# Patient Record
Sex: Female | Born: 1940
Health system: Southern US, Community
[De-identification: ages and names within clinical notes are randomized; demographics above are authoritative.]

## PROBLEM LIST (undated history)

## (undated) DIAGNOSIS — M8580 Other specified disorders of bone density and structure, unspecified site: Secondary | ICD-10-CM

## (undated) DIAGNOSIS — D569 Thalassemia, unspecified: Secondary | ICD-10-CM

## (undated) DIAGNOSIS — C4490 Unspecified malignant neoplasm of skin, unspecified: Secondary | ICD-10-CM

## (undated) DIAGNOSIS — D649 Anemia, unspecified: Secondary | ICD-10-CM

## (undated) DIAGNOSIS — E785 Hyperlipidemia, unspecified: Secondary | ICD-10-CM

## (undated) HISTORY — DX: Hyperlipidemia, unspecified: E78.5

## (undated) HISTORY — PX: BUNIONECTOMY: SHX129

## (undated) HISTORY — PX: TONSILLECTOMY AND ADENOIDECTOMY: SUR1326

## (undated) HISTORY — DX: Other specified disorders of bone density and structure, unspecified site: M85.80

## (undated) HISTORY — PX: DILATION AND CURETTAGE OF UTERUS: SHX78

## (undated) HISTORY — DX: Unspecified malignant neoplasm of skin, unspecified: C44.90

## (undated) HISTORY — DX: Thalassemia, unspecified: D56.9

## (undated) HISTORY — PX: CATARACT EXTRACTION: SUR2

## (undated) HISTORY — DX: Anemia, unspecified: D64.9

## (undated) HISTORY — PX: TUBAL LIGATION: SHX77

## (undated) HISTORY — PX: OTHER SURGICAL HISTORY: SHX169

## (undated) HISTORY — PX: APPENDECTOMY: SHX54

---

## 2001-06-19 HISTORY — PX: COLONOSCOPY: SHX174

## 2001-06-19 LAB — HM COLONOSCOPY

## 2001-10-21 DIAGNOSIS — C4491 Basal cell carcinoma of skin, unspecified: Secondary | ICD-10-CM

## 2001-10-21 DIAGNOSIS — D229 Melanocytic nevi, unspecified: Secondary | ICD-10-CM

## 2001-10-21 HISTORY — DX: Basal cell carcinoma of skin, unspecified: C44.91

## 2001-10-21 HISTORY — DX: Melanocytic nevi, unspecified: D22.9

## 2003-03-13 ENCOUNTER — Encounter: Payer: Self-pay | Admitting: Internal Medicine

## 2003-03-13 ENCOUNTER — Other Ambulatory Visit: Admission: RE | Admit: 2003-03-13 | Discharge: 2003-03-13 | Payer: Self-pay | Admitting: Internal Medicine

## 2003-04-22 ENCOUNTER — Encounter: Admission: RE | Admit: 2003-04-22 | Discharge: 2003-04-22 | Payer: Self-pay | Admitting: Internal Medicine

## 2005-04-10 ENCOUNTER — Ambulatory Visit: Payer: Self-pay | Admitting: Internal Medicine

## 2005-08-31 ENCOUNTER — Encounter: Admission: RE | Admit: 2005-08-31 | Discharge: 2005-08-31 | Payer: Self-pay | Admitting: Internal Medicine

## 2005-09-20 ENCOUNTER — Encounter: Admission: RE | Admit: 2005-09-20 | Discharge: 2005-09-20 | Payer: Self-pay | Admitting: Internal Medicine

## 2006-04-12 ENCOUNTER — Ambulatory Visit: Payer: Self-pay | Admitting: Internal Medicine

## 2006-04-12 LAB — CONVERTED CEMR LAB
ALT: 26 units/L (ref 0–40)
AST: 28 units/L (ref 0–37)
Albumin: 4.2 g/dL (ref 3.5–5.2)
Alkaline Phosphatase: 49 units/L (ref 39–117)
BUN: 17 mg/dL (ref 6–23)
Bilirubin, Direct: 0.1 mg/dL (ref 0.0–0.3)
CO2: 28 meq/L (ref 19–32)
Calcium: 9.2 mg/dL (ref 8.4–10.5)
Chloride: 108 meq/L (ref 96–112)
Chol/HDL Ratio, serum: 3.6
Cholesterol: 209 mg/dL (ref 0–200)
Creatinine, Ser: 0.9 mg/dL (ref 0.4–1.2)
GFR calc non Af Amer: 66 mL/min
Glomerular Filtration Rate, Af Am: 80 mL/min/{1.73_m2}
Glucose, Bld: 92 mg/dL (ref 70–99)
HDL: 57.6 mg/dL (ref >39.0)
LDL DIRECT: 125.8 mg/dL
Potassium: 4.5 meq/L (ref 3.5–5.1)
Sodium: 142 meq/L (ref 135–145)
TSH: 0.97 microintl units/mL (ref 0.35–5.50)
Total Bilirubin: 0.8 mg/dL (ref 0.3–1.2)
Total Protein: 6.6 g/dL (ref 6.0–8.3)
Triglyceride fasting, serum: 66 mg/dL (ref 0–149)
VLDL: 13 mg/dL (ref 0–40)

## 2006-04-27 ENCOUNTER — Ambulatory Visit: Payer: Self-pay | Admitting: Internal Medicine

## 2006-10-02 ENCOUNTER — Encounter: Admission: RE | Admit: 2006-10-02 | Discharge: 2006-10-02 | Payer: Self-pay | Admitting: Internal Medicine

## 2006-11-01 ENCOUNTER — Ambulatory Visit: Payer: Self-pay | Admitting: Internal Medicine

## 2007-02-15 DIAGNOSIS — D568 Other thalassemias: Secondary | ICD-10-CM | POA: Insufficient documentation

## 2007-07-03 ENCOUNTER — Encounter: Payer: Self-pay | Admitting: Internal Medicine

## 2007-07-03 DIAGNOSIS — Z85828 Personal history of other malignant neoplasm of skin: Secondary | ICD-10-CM

## 2007-07-04 ENCOUNTER — Ambulatory Visit: Payer: Self-pay | Admitting: Internal Medicine

## 2007-07-04 DIAGNOSIS — E785 Hyperlipidemia, unspecified: Secondary | ICD-10-CM

## 2007-07-05 LAB — CONVERTED CEMR LAB
ALT: 39 units/L — ABNORMAL HIGH (ref 0–35)
AST: 35 units/L (ref 0–37)
Albumin: 4 g/dL (ref 3.5–5.2)
Alkaline Phosphatase: 72 units/L (ref 39–117)
BUN: 15 mg/dL (ref 6–23)
Basophils Absolute: 0 10*3/uL (ref 0.0–0.1)
Basophils Relative: 0.5 % (ref 0.0–1.0)
Bilirubin, Direct: 0.1 mg/dL (ref 0.0–0.3)
CO2: 29 meq/L (ref 19–32)
Calcium: 9.3 mg/dL (ref 8.4–10.5)
Chloride: 105 meq/L (ref 96–112)
Cholesterol: 186 mg/dL (ref 0–200)
Creatinine, Ser: 0.7 mg/dL (ref 0.4–1.2)
Eosinophils Absolute: 0.1 10*3/uL (ref 0.0–0.6)
Eosinophils Relative: 2.1 % (ref 0.0–5.0)
GFR calc Af Amer: 107 mL/min
GFR calc non Af Amer: 88 mL/min
Glucose, Bld: 95 mg/dL (ref 70–99)
HCT: 35.6 % — ABNORMAL LOW (ref 36.0–46.0)
HDL: 42.7 mg/dL (ref >39.0)
Hemoglobin: 11.7 g/dL — ABNORMAL LOW (ref 12.0–15.0)
LDL Cholesterol: 130 mg/dL — ABNORMAL HIGH (ref 0–99)
Lymphocytes Relative: 42.1 % (ref 12.0–46.0)
MCHC: 32.9 g/dL (ref 30.0–36.0)
MCV: 68.1 fL — ABNORMAL LOW (ref 78.0–100.0)
Monocytes Absolute: 0.5 10*3/uL (ref 0.2–0.7)
Monocytes Relative: 9.4 % (ref 3.0–11.0)
Neutro Abs: 2.2 10*3/uL (ref 1.4–7.7)
Neutrophils Relative %: 45.9 % (ref 43.0–77.0)
Platelets: 277 10*3/uL (ref 150–400)
Potassium: 4.3 meq/L (ref 3.5–5.1)
RBC: 5.22 M/uL — ABNORMAL HIGH (ref 3.87–5.11)
RDW: 14.4 % (ref 11.5–14.6)
Sodium: 142 meq/L (ref 135–145)
TSH: 1.24 microintl units/mL (ref 0.35–5.50)
Total Bilirubin: 0.7 mg/dL (ref 0.3–1.2)
Total CHOL/HDL Ratio: 4.4
Total Protein: 6.8 g/dL (ref 6.0–8.3)
Triglycerides: 69 mg/dL (ref 0–149)
VLDL: 14 mg/dL (ref 0–40)
WBC: 4.8 10*3/uL (ref 4.5–10.5)

## 2007-07-25 ENCOUNTER — Encounter: Payer: Self-pay | Admitting: Internal Medicine

## 2007-07-25 ENCOUNTER — Ambulatory Visit: Payer: Self-pay | Admitting: Internal Medicine

## 2007-08-07 ENCOUNTER — Ambulatory Visit: Payer: Self-pay | Admitting: Internal Medicine

## 2007-08-12 DIAGNOSIS — M810 Age-related osteoporosis without current pathological fracture: Secondary | ICD-10-CM

## 2007-11-01 ENCOUNTER — Encounter: Admission: RE | Admit: 2007-11-01 | Discharge: 2007-11-01 | Payer: Self-pay | Admitting: Internal Medicine

## 2008-05-22 ENCOUNTER — Ambulatory Visit: Payer: Self-pay | Admitting: Internal Medicine

## 2008-06-22 ENCOUNTER — Ambulatory Visit: Payer: Self-pay | Admitting: Internal Medicine

## 2008-06-26 ENCOUNTER — Telehealth: Payer: Self-pay | Admitting: Internal Medicine

## 2008-10-16 ENCOUNTER — Ambulatory Visit: Payer: Self-pay | Admitting: Internal Medicine

## 2008-10-19 LAB — CONVERTED CEMR LAB
ALT: 26 units/L (ref 0–35)
AST: 27 units/L (ref 0–37)
Albumin: 4.3 g/dL (ref 3.5–5.2)
Alkaline Phosphatase: 52 units/L (ref 39–117)
BUN: 15 mg/dL (ref 6–23)
Basophils Absolute: 0 10*3/uL (ref 0.0–0.1)
Basophils Relative: 1 % (ref 0.0–3.0)
Bilirubin, Direct: 0.1 mg/dL (ref 0.0–0.3)
CO2: 29 meq/L (ref 19–32)
Calcium: 9.4 mg/dL (ref 8.4–10.5)
Chloride: 110 meq/L (ref 96–112)
Cholesterol: 205 mg/dL — ABNORMAL HIGH (ref 0–200)
Creatinine, Ser: 0.8 mg/dL (ref 0.4–1.2)
Direct LDL: 125.3 mg/dL
Eosinophils Absolute: 0.1 10*3/uL (ref 0.0–0.7)
Eosinophils Relative: 1.9 % (ref 0.0–5.0)
GFR calc non Af Amer: 75.31 mL/min (ref >60)
Glucose, Bld: 105 mg/dL — ABNORMAL HIGH (ref 70–99)
HCT: 35.7 % — ABNORMAL LOW (ref 36.0–46.0)
HDL: 57.9 mg/dL (ref >39.00)
Hemoglobin: 11.7 g/dL — ABNORMAL LOW (ref 12.0–15.0)
Lymphocytes Relative: 45.9 % (ref 12.0–46.0)
Lymphs Abs: 2 10*3/uL (ref 0.7–4.0)
MCHC: 32.8 g/dL (ref 30.0–36.0)
MCV: 69.2 fL — ABNORMAL LOW (ref 78.0–100.0)
Monocytes Absolute: 0.4 10*3/uL (ref 0.1–1.0)
Monocytes Relative: 8.8 % (ref 3.0–12.0)
Neutro Abs: 1.8 10*3/uL (ref 1.4–7.7)
Neutrophils Relative %: 42.4 % — ABNORMAL LOW (ref 43.0–77.0)
Platelets: 204 10*3/uL (ref 150.0–400.0)
Potassium: 4.3 meq/L (ref 3.5–5.1)
RBC: 5.15 M/uL — ABNORMAL HIGH (ref 3.87–5.11)
RDW: 14.6 % (ref 11.5–14.6)
Sodium: 144 meq/L (ref 135–145)
TSH: 1.13 microintl units/mL (ref 0.35–5.50)
Total Bilirubin: 1 mg/dL (ref 0.3–1.2)
Total CHOL/HDL Ratio: 4
Total Protein: 7.1 g/dL (ref 6.0–8.3)
Triglycerides: 79 mg/dL (ref 0.0–149.0)
VLDL: 15.8 mg/dL (ref 0.0–40.0)
WBC: 4.3 10*3/uL — ABNORMAL LOW (ref 4.5–10.5)

## 2008-11-10 ENCOUNTER — Encounter: Admission: RE | Admit: 2008-11-10 | Discharge: 2008-11-10 | Payer: Self-pay | Admitting: Internal Medicine

## 2009-03-22 ENCOUNTER — Encounter: Payer: Self-pay | Admitting: Internal Medicine

## 2009-07-22 ENCOUNTER — Telehealth: Payer: Self-pay | Admitting: Internal Medicine

## 2009-08-31 ENCOUNTER — Ambulatory Visit: Payer: Self-pay | Admitting: Family Medicine

## 2009-09-02 ENCOUNTER — Telehealth: Payer: Self-pay | Admitting: Family Medicine

## 2009-10-11 ENCOUNTER — Ambulatory Visit: Payer: Self-pay | Admitting: Internal Medicine

## 2009-10-11 ENCOUNTER — Encounter: Payer: Self-pay | Admitting: Internal Medicine

## 2009-10-18 ENCOUNTER — Ambulatory Visit: Payer: Self-pay | Admitting: Internal Medicine

## 2009-10-19 LAB — CONVERTED CEMR LAB: Vit D, 25-Hydroxy: 33 ng/mL (ref 30–89)

## 2009-10-20 LAB — CONVERTED CEMR LAB
ALT: 25 units/L (ref 0–35)
AST: 26 units/L (ref 0–37)
Albumin: 4.1 g/dL (ref 3.5–5.2)
Alkaline Phosphatase: 56 units/L (ref 39–117)
BUN: 14 mg/dL (ref 6–23)
Basophils Absolute: 0 10*3/uL (ref 0.0–0.1)
Basophils Relative: 1.2 % (ref 0.0–3.0)
Bilirubin, Direct: 0.1 mg/dL (ref 0.0–0.3)
CO2: 32 meq/L (ref 19–32)
Calcium: 9.1 mg/dL (ref 8.4–10.5)
Chloride: 108 meq/L (ref 96–112)
Cholesterol: 192 mg/dL (ref 0–200)
Creatinine, Ser: 0.8 mg/dL (ref 0.4–1.2)
Eosinophils Absolute: 0.1 10*3/uL (ref 0.0–0.7)
Eosinophils Relative: 2.9 % (ref 0.0–5.0)
GFR calc non Af Amer: 75.1 mL/min (ref >60)
Glucose, Bld: 99 mg/dL (ref 70–99)
HCT: 34.8 % — ABNORMAL LOW (ref 36.0–46.0)
HDL: 59.1 mg/dL (ref >39.00)
Hemoglobin: 11.4 g/dL — ABNORMAL LOW (ref 12.0–15.0)
LDL Cholesterol: 115 mg/dL — ABNORMAL HIGH (ref 0–99)
Lymphocytes Relative: 43.5 % (ref 12.0–46.0)
Lymphs Abs: 1.7 10*3/uL (ref 0.7–4.0)
MCHC: 32.6 g/dL (ref 30.0–36.0)
MCV: 68.3 fL — ABNORMAL LOW (ref 78.0–100.0)
Monocytes Absolute: 0.3 10*3/uL (ref 0.1–1.0)
Monocytes Relative: 7.7 % (ref 3.0–12.0)
Neutro Abs: 1.7 10*3/uL (ref 1.4–7.7)
Neutrophils Relative %: 44.7 % (ref 43.0–77.0)
Platelets: 228 10*3/uL (ref 150.0–400.0)
Potassium: 4.2 meq/L (ref 3.5–5.1)
RBC: 5.1 M/uL (ref 3.87–5.11)
RDW: 16.4 % — ABNORMAL HIGH (ref 11.5–14.6)
Sodium: 145 meq/L (ref 135–145)
TSH: 1.52 microintl units/mL (ref 0.35–5.50)
Total Bilirubin: 0.6 mg/dL (ref 0.3–1.2)
Total CHOL/HDL Ratio: 3
Total Protein: 6.8 g/dL (ref 6.0–8.3)
Triglycerides: 91 mg/dL (ref 0.0–149.0)
VLDL: 18.2 mg/dL (ref 0.0–40.0)
WBC: 3.9 10*3/uL — ABNORMAL LOW (ref 4.5–10.5)

## 2009-10-26 ENCOUNTER — Ambulatory Visit: Payer: Self-pay | Admitting: Internal Medicine

## 2009-10-27 LAB — CONVERTED CEMR LAB
OCCULT 1: NEGATIVE
OCCULT 2: NEGATIVE
OCCULT 3: NEGATIVE

## 2009-11-11 ENCOUNTER — Encounter: Admission: RE | Admit: 2009-11-11 | Discharge: 2009-11-11 | Payer: Self-pay | Admitting: Internal Medicine

## 2009-11-11 LAB — HM MAMMOGRAPHY

## 2010-07-10 ENCOUNTER — Encounter: Payer: Self-pay | Admitting: Internal Medicine

## 2010-07-17 LAB — CONVERTED CEMR LAB: Pap Smear: NORMAL

## 2010-07-21 NOTE — Assessment & Plan Note (Signed)
Summary: CPX (PT WILL COME IN FASTING) // RS   Vital Signs:  Patient profile:   70 year old female Menstrual status:  postmenopausal Height:      63 inches Weight:      160 pounds BMI:     28.45 Pulse rate:   76 / minute Pulse rhythm:   regular Resp:     12 per minute BP sitting:   104 / 64  (left arm) Cuff size:   regular  Vitals Entered By: Gladis Riffle, RN (Oct 18, 2009 8:11 AM) CC: annual review of systems. fasting Is Patient Diabetic? No     Menstrual Status postmenopausal Last PAP Result normal   CC:  annual review of systems. fasting.  History of Present Illness: CPX  Preventive Screening-Counseling & Management  Alcohol-Tobacco     Smoking Status: never  Current Problems (verified): 1)  Osteopenia  (ICD-733.90) 2)  Preventive Health Care  (ICD-V70.0) 3)  Hyperlipidemia  (ICD-272.4) 4)  Skin Cancer, Hx of  (ICD-V10.83) 5)  Anemia-nos  (ICD-285.9) 6)  Thalassemia Nec  (ICD-282.49)  Current Medications (verified): 1)  Lipitor 10 Mg Tabs (Atorvastatin Calcium) .... 1/2 Once Daily 2)  Multivitamins   Tabs (Multiple Vitamin) .... Once Daily 3)  B-100   Tabs (Vitamins-Lipotropics) .... Once Daily 4)  Co Q-10 150 Mg  Caps (Coenzyme Q10) .... Once Daily 5)  Caltrate 600 1500 Mg  Tabs (Calcium Carbonate) .... Once Daily 6)  Vitamin B-12 1000 Mcg  Tabs (Cyanocobalamin) .... Once Daily 7)  Actonel 150 Mg  Tabs (Risedronate Sodium) .Marland Kitchen.. 1 By Mouth Every Month 8)  Zolpidem Tartrate 5 Mg  Tabs (Zolpidem Tartrate) .Marland Kitchen.. 1 By Mouth At Night As Needed 9)  Vitamin D3 400 Unit Tabs (Cholecalciferol) .... Once Daily  Allergies (verified): No Known Drug Allergies  Past History:  Past Medical History: Last updated: 08/07/2007 Anemia-NOS thalassemia Skin cancer, hx of--scalp Hyperlipidemia Osteopenia  Family History: Last updated: 07/03/2007 Family History High cholesterol mother died age 28--had hip fx, osteopena, dementia father died age77-- MI,hyper lipidemia 2  brothers--radiation poisoning, ?MS 4 sisters A & W Family History of Stroke F 1st degree relative <60  Social History: Last updated: 07/03/2007 Never Smoked Retired Single Alcohol use-yes Drug use-no Regular exercise-no widow  Risk Factors: Exercise: no (02/15/2007)  Risk Factors: Smoking Status: never (10/18/2009)  Past Surgical History: Appendectomy Caesarean section X 2 Skin CA scalp miscarriage x 2 Colonoscopy-2003 Tonsillectomy, adenoidectomy bunionectomy, complicated with fractured toe  Review of Systems       All other systems reviewed and were negative   Physical Exam  General:  Well-developed,well-nourished,in no acute distress; alert,appropriate and cooperative throughout examination Head:  normocephalic and atraumatic.   Eyes:  pupils equal and pupils round.   Ears:  R ear normal and L ear normal.   Nose:  no external deformity and no external erythema.   Mouth:  patient has aphthous ulcer left side of tongue otherwise oropharynx clear. No evidence for thrush Neck:  No deformities, masses, or tenderness noted. Chest Wall:  No deformities, masses, or tenderness noted. Lungs:  Normal respiratory effort, chest expands symmetrically. Lungs are clear to auscultation, no crackles or wheezes. Heart:  normal rate and regular rhythm.   Abdomen:  soft and non-tender.   Msk:  No deformity or scoliosis noted of thoracic or lumbar spine.   Pulses:  R radial normal and L radial normal.   Neurologic:  alert & oriented X3 and gait normal.   Skin:  turgor normal and color normal.   Cervical Nodes:  no anterior cervical adenopathy and no posterior cervical adenopathy.   Psych:  memory intact for recent and remote and good eye contact.     Impression & Recommendations:  Problem # 1:  PREVENTIVE HEALTH CARE (ICD-V70.0) health maint UTD Orders: Venipuncture (16109) TLB-Lipid Panel (80061-LIPID) TLB-BMP (Basic Metabolic Panel-BMET) (80048-METABOL) TLB-CBC Platelet -  w/Differential (85025-CBCD) TLB-Hepatic/Liver Function Pnl (80076-HEPATIC) TLB-TSH (Thyroid Stimulating Hormone) (84443-TSH) UA Dipstick w/o Micro (automated)  (81003)  Problem # 2:  OSTEOPENIA (ICD-733.90)  Her updated medication list for this problem includes:    Actonel 150 Mg Tabs (Risedronate sodium) .Marland Kitchen... 1 by mouth every month  Orders: T-Vitamin D (25-Hydroxy) [id number])  Problem # 3:  HYPERLIPIDEMIA (ICD-272.4) needs labs, check today Her updated medication list for this problem includes:    Lipitor 10 Mg Tabs (Atorvastatin calcium) .Marland Kitchen... 1/2 once daily  Labs Reviewed: SGOT: 27 (10/16/2008)   SGPT: 26 (10/16/2008)   HDL:57.90 (10/16/2008), 42.7 (07/04/2007)  LDL:130 (07/04/2007), DEL (04/12/2006)  Chol:205 (10/16/2008), 186 (07/04/2007)  Trig:79.0 (10/16/2008), 69 (07/04/2007)  Problem # 4:  ANEMIA-NOS (ICD-285.9)  likely secondary to thalasemia check labs today Her updated medication list for this problem includes:    Vitamin B-12 1000 Mcg Tabs (Cyanocobalamin) ..... Once daily  Orders: Hemoccult Cards (Take Home) (Hemoccult Cards)  Complete Medication List: 1)  Lipitor 10 Mg Tabs (Atorvastatin calcium) .... 1/2 once daily 2)  Multivitamins Tabs (Multiple vitamin) .... Once daily 3)  B-100 Tabs (Vitamins-lipotropics) .... Once daily 4)  Co Q-10 150 Mg Caps (Coenzyme q10) .... Once daily 5)  Caltrate 600 1500 Mg Tabs (Calcium carbonate) .... Once daily 6)  Vitamin B-12 1000 Mcg Tabs (Cyanocobalamin) .... Once daily 7)  Actonel 150 Mg Tabs (Risedronate sodium) .Marland Kitchen.. 1 by mouth every month 8)  Zolpidem Tartrate 5 Mg Tabs (Zolpidem tartrate) .Marland Kitchen.. 1 by mouth at night as needed 9)  Vitamin D3 400 Unit Tabs (Cholecalciferol) .... Once daily  Prevention & Chronic Care Immunizations   Influenza vaccine: fluvirin  (03/17/2009)   Influenza vaccine due: 02/17/2010    Tetanus booster: 03/19/2006: Historical    Pneumococcal vaccine: Historical  (03/19/2004)    H.  zoster vaccine: 04/12/2006: Zostavax  Colorectal Screening   Hemoccult: Not documented    Colonoscopy: historical  (06/19/2001)  Other Screening   Pap smear: normal  (03/13/2003)    Mammogram: ASSESSMENT: Negative - BI-RADS 1^MM DIGITAL SCREENING  (11/10/2008)   Mammogram due: 09/2007    DXA bone density scan: osteopenia  (07/23/2007)   DXA scan due: 07/2009    Smoking status: never  (10/18/2009)  Lipids   Total Cholesterol: 205  (10/16/2008)   LDL: 130  (07/04/2007)   LDL Direct: 125.3  (10/16/2008)   HDL: 57.90  (10/16/2008)   Triglycerides: 79.0  (10/16/2008)    SGOT (AST): 27  (10/16/2008)   SGPT (ALT): 26  (10/16/2008)   Alkaline phosphatase: 52  (10/16/2008)   Total bilirubin: 1.0  (10/16/2008)  Self-Management Support :    Lipid self-management support: Not documented     Appended Document: CPX (PT WILL COME IN FASTING) // RS  Laboratory Results   Urine Tests    Routine Urinalysis   Color: yellow Appearance: Clear Glucose: negative   (Normal Range: Negative) Bilirubin: negative   (Normal Range: Negative) Ketone: negative   (Normal Range: Negative) Spec. Gravity: 1.025   (Normal Range: 1.003-1.035) Blood: negative   (Normal Range: Negative) pH: 5.5   (Normal Range: 5.0-8.0) Protein:  negative   (Normal Range: Negative) Urobilinogen: 0.2   (Normal Range: 0-1) Nitrite: negative   (Normal Range: Negative) Leukocyte Esterace: negative   (Normal Range: Negative)    Comments: Rita Ohara  Oct 18, 2009 12:58 PM

## 2010-07-21 NOTE — Progress Notes (Signed)
Summary: [facility] [address] called. Pt req refill of generic Ambien  Phone Note From Pharmacy   Caller: [facility] [address] [facility] [place] Summary of Call: [facility] [address] called and said pt is req refill of generic Ambien. Pt has switched pharmacys. Please call in to [facility] [phone].     Initial call taken by: Lucy Antigua,  July 22, 2009 9:39 AM    Prescriptions: ZOLPIDEM TARTRATE 5 MG  TABS (ZOLPIDEM TARTRATE) 1 by mouth at night as needed  #20 x 1   Entered by:   Gladis Riffle, RN   Authorized by:   Birdie Sons MD   Signed by:   Gladis Riffle, RN on 07/22/2009   Method used:   Telephoned to ...       [facility] [address] [phone]* (retail)       [address]       [place], [place]  [address]       Ph: [phone]       Fax: [phone]   RxID:   [id number]

## 2010-07-21 NOTE — Assessment & Plan Note (Signed)
Summary: oral thrush????/dm   Vital Signs:  Patient profile:   70 year old female Temp:     99.4 degrees F oral BP sitting:   130 / 80  (left arm) Cuff size:   regular  Vitals Entered By: Sid Falcon LPN (August 31, 2009 11:38 AM) CC: oral thrush mouth   History of Present Illness: Acute visit. Patient noticed soreness mostly left side of tongue last week. Slightly better today. Recent antibiotic a few weeks ago. No history of thrush but she had initial concerns this may be thrush. No difficulty swallowing. History of mouth ulcers intermittently in the past. Recent surgery left ankle and is recovering well. No fever or chills.  Allergies: No Known Drug Allergies  Past History:  Past Medical History: Last updated: 08/07/2007 Anemia-NOS thalassemia Skin cancer, hx of--scalp Hyperlipidemia Osteopenia PMH reviewed for relevance  Review of Systems  The patient denies anorexia, fever, and weight loss.    Physical Exam  General:  Well-developed,well-nourished,in no acute distress; alert,appropriate and cooperative throughout examination Mouth:  patient has aphthous ulcer left side of tongue otherwise oropharynx clear. No evidence for thrush Neck:  No deformities, masses, or tenderness noted. Lungs:  Normal respiratory effort, chest expands symmetrically. Lungs are clear to auscultation, no crackles or wheezes. Heart:  normal rate and regular rhythm.     Impression & Recommendations:  Problem # 1:  APHTHOUS ULCERS (ICD-528.2) Magic mouthwash as needed. Follow up p.r.n.  Complete Medication List: 1)  Lipitor 10 Mg Tabs (Atorvastatin calcium) .... 1/2 once daily 2)  Multivitamins Tabs (Multiple vitamin) .... Once daily 3)  B-100 Tabs (Vitamins-lipotropics) .... Once daily 4)  Co Q-10 150 Mg Caps (Coenzyme q10) .... Once daily 5)  Caltrate 600 1500 Mg Tabs (Calcium carbonate) .... Once daily 6)  Vitamin B-12 1000 Mcg Tabs (Cyanocobalamin) .... Once daily 7)  Actonel 150  Mg Tabs (Risedronate sodium) .Marland Kitchen.. 1 by mouth every month 8)  Zolpidem Tartrate 5 Mg Tabs (Zolpidem tartrate) .Marland Kitchen.. 1 by mouth at night as needed  Patient Instructions: 1)  Use Magic mouthwash as prescribed. Follow up as needed.

## 2010-07-21 NOTE — Progress Notes (Signed)
Summary: Rx problem  Phone Note Call from Patient Call back at Home Phone [phone]   Caller: vm Wed Summary of Call: Unable to get magic mouthwash filled because pharmacist says 2 kinds.  Please call in Rx to [facility] [address] Initial call taken by: Rudy Jew, RN,  September 02, 2009 8:30 AM  Follow-up for Phone Call        Magic mouthwash contituted as follows:  diphenhydramine 12.5mg /75ml, 160 ml nystatin 100,000 units susp, 160 ml prednisolone 15mg /64ml solution  swish, gargoyle, and spit 2 tsp qid. Follow-up by: Evelena Peat MD,  September 02, 2009 8:52 AM  Additional Follow-up for Phone Call Additional follow up Details #1::        Phone Call Completed Additional Follow-up by: Rudy Jew, RN,  September 02, 2009 10:39 AM

## 2010-07-21 NOTE — Miscellaneous (Signed)
Summary: BONE DENSITY  Clinical Lists Changes  Orders: Added new Test order of T-Bone Densitometry (77080) - Signed Added new Test order of T-Lumbar Vertebral Assessment (77082) - Signed 

## 2010-10-11 ENCOUNTER — Other Ambulatory Visit: Payer: Self-pay | Admitting: Internal Medicine

## 2010-10-11 DIAGNOSIS — Z1231 Encounter for screening mammogram for malignant neoplasm of breast: Secondary | ICD-10-CM

## 2010-11-15 ENCOUNTER — Ambulatory Visit
Admission: RE | Admit: 2010-11-15 | Discharge: 2010-11-15 | Disposition: A | Discharge status: Home / Self Care | Payer: Medicare Other | Source: Ambulatory Visit | Attending: Internal Medicine | Admitting: Internal Medicine

## 2010-11-15 DIAGNOSIS — Z1231 Encounter for screening mammogram for malignant neoplasm of breast: Secondary | ICD-10-CM

## 2011-01-20 ENCOUNTER — Ambulatory Visit (INDEPENDENT_AMBULATORY_CARE_PROVIDER_SITE_OTHER): Payer: Medicare Other | Admitting: Internal Medicine

## 2011-01-20 DIAGNOSIS — Z23 Encounter for immunization: Secondary | ICD-10-CM

## 2011-03-13 ENCOUNTER — Encounter: Payer: Medicare Other | Admitting: Internal Medicine

## 2011-03-24 ENCOUNTER — Encounter: Payer: Self-pay | Admitting: Internal Medicine

## 2011-03-27 ENCOUNTER — Ambulatory Visit (INDEPENDENT_AMBULATORY_CARE_PROVIDER_SITE_OTHER): Payer: Medicare Other | Admitting: Internal Medicine

## 2011-03-27 ENCOUNTER — Encounter: Payer: Self-pay | Admitting: Internal Medicine

## 2011-03-27 VITALS — BP 132/68 | HR 84 | Temp 98.8°F | Ht 63.0 in | Wt 143.0 lb

## 2011-03-27 DIAGNOSIS — Z0000 Encounter for general adult medical examination without abnormal findings: Secondary | ICD-10-CM

## 2011-03-27 DIAGNOSIS — Z23 Encounter for immunization: Secondary | ICD-10-CM

## 2011-03-27 DIAGNOSIS — M949 Disorder of cartilage, unspecified: Secondary | ICD-10-CM

## 2011-03-27 DIAGNOSIS — E785 Hyperlipidemia, unspecified: Secondary | ICD-10-CM

## 2011-03-27 DIAGNOSIS — M899 Disorder of bone, unspecified: Secondary | ICD-10-CM

## 2011-03-27 DIAGNOSIS — D649 Anemia, unspecified: Secondary | ICD-10-CM

## 2011-03-27 LAB — CBC WITH DIFFERENTIAL/PLATELET
HCT: 35.3 % — ABNORMAL LOW (ref 36.0–46.0)
Hemoglobin: 11.2 g/dL — ABNORMAL LOW (ref 12.0–15.0)
MCHC: 31.8 g/dL (ref 30.0–36.0)
MCV: 68.4 fl — ABNORMAL LOW (ref 78.0–100.0)
Platelets: 184 10*3/uL (ref 150.0–400.0)
RBC: 5.16 Mil/uL — ABNORMAL HIGH (ref 3.87–5.11)
RDW: 16.5 % — ABNORMAL HIGH (ref 11.5–14.6)
WBC: 3.7 10*3/uL — ABNORMAL LOW (ref 4.5–10.5)

## 2011-03-27 LAB — TSH: TSH: 1.47 u[IU]/mL (ref 0.35–5.50)

## 2011-03-27 LAB — HEPATIC FUNCTION PANEL
ALT: 17 U/L (ref 0–35)
AST: 25 U/L (ref 0–37)
Albumin: 4.5 g/dL (ref 3.5–5.2)
Alkaline Phosphatase: 62 U/L (ref 39–117)
Bilirubin, Direct: 0 mg/dL (ref 0.0–0.3)
Total Bilirubin: 0.8 mg/dL (ref 0.3–1.2)
Total Protein: 6.9 g/dL (ref 6.0–8.3)

## 2011-03-27 LAB — BASIC METABOLIC PANEL
BUN: 18 mg/dL (ref 6–23)
CO2: 29 mEq/L (ref 19–32)
Calcium: 9.2 mg/dL (ref 8.4–10.5)
Chloride: 107 mEq/L (ref 96–112)
Creatinine, Ser: 0.9 mg/dL (ref 0.4–1.2)
GFR: 69.74 mL/min (ref >60.00)
Glucose, Bld: 95 mg/dL (ref 70–99)
Potassium: 4.5 mEq/L (ref 3.5–5.1)
Sodium: 143 mEq/L (ref 135–145)

## 2011-03-27 LAB — LIPID PANEL
Cholesterol: 259 mg/dL — ABNORMAL HIGH (ref 0–200)
HDL: 64.3 mg/dL (ref >39.00)
Total CHOL/HDL Ratio: 4
Triglycerides: 95 mg/dL (ref 0.0–149.0)
VLDL: 19 mg/dL (ref 0.0–40.0)

## 2011-03-27 LAB — VITAMIN B12: Vitamin B-12: 602 pg/mL (ref 211–911)

## 2011-03-27 NOTE — Progress Notes (Signed)
  Subjective:    Patient ID: Abigail Shah, female    DOB: [birth year], 70 y.o.   MRN: [id number]  HPI cpx  Past Medical History  Diagnosis Date  . Anemia   . Thalassemia   . Skin cancer     hx of scalp  . Hyperlipidemia   . Osteopenia    Past Surgical History  Procedure Date  . Appendectomy   . Cesarean section   . Skin cancer scalp   . Miscarriage     x2  . Colonoscopy 2003  . Tonsillectomy and adenoidectomy   . Bunionectomy     complicated with fractured toe     reports that she has never smoked. She does not have any smokeless tobacco history on file. She reports that she does not drink alcohol or use illicit drugs. family history includes Dementia in her mother; Heart attack in her father; Hyperlipidemia in her father and other; Multiple sclerosis in her brother; and Stroke in her other. No Known Allergies    Review of Systems  patient denies chest pain, shortness of breath, orthopnea. Denies lower extremity edema, abdominal pain, change in appetite, change in bowel movements. Patient denies rashes, musculoskeletal complaints. No other specific complaints in a complete review of systems.      Objective:   Physical Exam  Well-developed well-nourished female in no acute distress. HEENT exam atraumatic, normocephalic, extraocular muscles are intact. Neck is supple. No jugular venous distention no thyromegaly. Chest clear to auscultation without increased work of breathing. Cardiac exam S1 and S2 are regular. Abdominal exam active bowel sounds, soft, nontender. Extremities no edema. Neurologic exam she is alert without any motor sensory deficits. Gait is normal.     Assessment & Plan:  Well visit, health maint UTD

## 2011-03-28 LAB — VITAMIN D 25 HYDROXY (VIT D DEFICIENCY, FRACTURES): Vit D, 25-Hydroxy: 35 ng/mL (ref 30–89)

## 2011-08-01 ENCOUNTER — Other Ambulatory Visit: Payer: Self-pay | Admitting: Physician Assistant

## 2011-09-13 ENCOUNTER — Ambulatory Visit (INDEPENDENT_AMBULATORY_CARE_PROVIDER_SITE_OTHER): Payer: Medicare Other | Admitting: Internal Medicine

## 2011-09-13 ENCOUNTER — Encounter: Payer: Self-pay | Admitting: Internal Medicine

## 2011-09-13 VITALS — BP 108/70 | Temp 97.6°F | Wt 148.0 lb

## 2011-09-13 DIAGNOSIS — J069 Acute upper respiratory infection, unspecified: Secondary | ICD-10-CM

## 2011-09-13 MED ORDER — HYDROCODONE-HOMATROPINE 5-1.5 MG/5ML PO SYRP: 5.0000 mL | ORAL_SOLUTION | Freq: Four times a day (QID) | ORAL | Status: AC | PRN

## 2011-09-13 NOTE — Patient Instructions (Signed)
Get plenty of rest, Drink lots of  clear liquids, and use Tylenol or ibuprofen for fever and discomfort.    Call or return to clinic prn if these symptoms worsen or fail to improve as anticipated.  

## 2011-09-13 NOTE — Progress Notes (Signed)
  Subjective:    Patient ID: Abigail Shah, female    DOB: 1941-02-06, 71 y.o.   MRN: [id number]  HPI  71 year old patient who presents with a ten-day history of head and chest congestion with largely nonproductive cough. She felt weak last week but the this week has felt much improved. Her chief complaint is cough there's been no fever or significant sputum production. Denies any chest pain or shortness. She became ill while on a cruise in [place]   Review of Systems  Constitutional: Negative.   HENT: Positive for congestion and rhinorrhea. Negative for hearing loss, sore throat, dental problem, sinus pressure and tinnitus.   Eyes: Negative for pain, discharge and visual disturbance.  Respiratory: Positive for cough. Negative for shortness of breath.   Cardiovascular: Negative for chest pain, palpitations and leg swelling.  Gastrointestinal: Negative for nausea, vomiting, abdominal pain, diarrhea, constipation, blood in stool and abdominal distention.  Genitourinary: Negative for dysuria, urgency, frequency, hematuria, flank pain, vaginal bleeding, vaginal discharge, difficulty urinating, vaginal pain and pelvic pain.  Musculoskeletal: Negative for joint swelling, arthralgias and gait problem.  Skin: Negative for rash.  Neurological: Negative for dizziness, syncope, speech difficulty, weakness, numbness and headaches.  Hematological: Negative for adenopathy.  Psychiatric/Behavioral: Negative for behavioral problems, dysphoric mood and agitation. The patient is not nervous/anxious.        Objective:   Physical Exam  Constitutional: She is oriented to person, place, and time. She appears well-developed and well-nourished.  HENT:  Head: Normocephalic.  Right Ear: External ear normal.  Left Ear: External ear normal.  Mouth/Throat: Oropharynx is clear and moist.  Eyes: Conjunctivae and EOM are normal. Pupils are equal, round, and reactive to light.  Neck: Normal range of motion. Neck  supple. No thyromegaly present.  Cardiovascular: Normal rate, regular rhythm, normal heart sounds and intact distal pulses.   Pulmonary/Chest: Effort normal and breath sounds normal.  Abdominal: Soft. Bowel sounds are normal. She exhibits no mass. There is no tenderness.  Musculoskeletal: Normal range of motion.  Lymphadenopathy:    She has no cervical adenopathy.  Neurological: She is alert and oriented to person, place, and time.  Skin: Skin is warm and dry. No rash noted.  Psychiatric: She has a normal mood and affect. Her behavior is normal.          Assessment & Plan:    Viral URI with cough. The patient will continue Mucinex. Was given a prescription for when necessary use for Hydromet

## 2011-10-19 ENCOUNTER — Other Ambulatory Visit: Payer: Self-pay | Admitting: Internal Medicine

## 2011-10-19 DIAGNOSIS — Z1231 Encounter for screening mammogram for malignant neoplasm of breast: Secondary | ICD-10-CM

## 2011-11-20 ENCOUNTER — Ambulatory Visit
Admission: RE | Admit: 2011-11-20 | Discharge: 2011-11-20 | Disposition: A | Discharge status: Home / Self Care | Payer: Medicare Other | Source: Ambulatory Visit | Attending: Internal Medicine | Admitting: Internal Medicine

## 2011-11-20 DIAGNOSIS — Z1231 Encounter for screening mammogram for malignant neoplasm of breast: Secondary | ICD-10-CM

## 2012-03-27 ENCOUNTER — Encounter: Payer: Medicare Other | Admitting: Internal Medicine

## 2012-03-29 ENCOUNTER — Encounter: Payer: Medicare Other | Admitting: Internal Medicine

## 2012-04-03 ENCOUNTER — Ambulatory Visit (INDEPENDENT_AMBULATORY_CARE_PROVIDER_SITE_OTHER): Payer: Medicare Other

## 2012-04-03 DIAGNOSIS — Z23 Encounter for immunization: Secondary | ICD-10-CM

## 2012-05-06 ENCOUNTER — Encounter: Payer: Self-pay | Admitting: Internal Medicine

## 2012-05-06 ENCOUNTER — Ambulatory Visit (INDEPENDENT_AMBULATORY_CARE_PROVIDER_SITE_OTHER): Payer: Medicare Other | Admitting: Internal Medicine

## 2012-05-06 VITALS — BP 124/70 | HR 84 | Temp 98.7°F | Ht 63.5 in | Wt 150.0 lb

## 2012-05-06 DIAGNOSIS — E785 Hyperlipidemia, unspecified: Secondary | ICD-10-CM

## 2012-05-06 DIAGNOSIS — Z0000 Encounter for general adult medical examination without abnormal findings: Secondary | ICD-10-CM

## 2012-05-06 LAB — BASIC METABOLIC PANEL
Calcium: 8.9 mg/dL (ref 8.4–10.5)
GFR: 80.33 mL/min (ref >60.00)
Glucose, Bld: 100 mg/dL — ABNORMAL HIGH (ref 70–99)
Sodium: 139 mEq/L (ref 135–145)

## 2012-05-06 LAB — CBC WITH DIFFERENTIAL/PLATELET
Basophils Absolute: 0 10*3/uL (ref 0.0–0.1)
Eosinophils Relative: 0.9 % (ref 0.0–5.0)
Hemoglobin: 10.8 g/dL — ABNORMAL LOW (ref 12.0–15.0)
Lymphocytes Relative: 39 % (ref 12.0–46.0)
Monocytes Relative: 8.7 % (ref 3.0–12.0)
Neutro Abs: 1.8 10*3/uL (ref 1.4–7.7)
Platelets: 183 10*3/uL (ref 150.0–400.0)
RDW: 15.7 % — ABNORMAL HIGH (ref 11.5–14.6)
WBC: 3.6 10*3/uL — ABNORMAL LOW (ref 4.5–10.5)

## 2012-05-06 LAB — HEPATIC FUNCTION PANEL
Alkaline Phosphatase: 56 U/L (ref 39–117)
Bilirubin, Direct: 0.1 mg/dL (ref 0.0–0.3)
Total Protein: 6.7 g/dL (ref 6.0–8.3)

## 2012-05-06 LAB — LIPID PANEL: VLDL: 15 mg/dL (ref 0.0–40.0)

## 2012-05-06 NOTE — Progress Notes (Signed)
Patient ID: Abigail Shah, female   DOB: 1940-11-02, 71 y.o.   MRN: [id number] CPX She stays active-water aerobic Practices the organ She travels a lot  Past Medical History  Diagnosis Date  . Anemia   . Thalassemia   . Skin cancer     hx of scalp  . Hyperlipidemia   . Osteopenia     History   Social History  . Marital Status: Married    Spouse Name: N/A    Number of Children: N/A  . Years of Education: N/A   Occupational History  . Not on file.   Social History Main Topics  . Smoking status: Never Smoker   . Smokeless tobacco: Not on file  . Alcohol Use: No  . Drug Use: No  . Sexually Active: Not on file   Other Topics Concern  . Not on file   Social History Narrative  . No narrative on file    Past Surgical History  Procedure Date  . Appendectomy   . Cesarean section   . Skin cancer scalp   . Miscarriage     x2  . Colonoscopy 2003  . Tonsillectomy and adenoidectomy   . Bunionectomy     complicated with fractured toe     Family History  Problem Relation Age of Onset  . Dementia Mother   . Hyperlipidemia Father   . Heart attack Father   . Hyperlipidemia Other   . Stroke Other   . Multiple sclerosis Brother     No Known Allergies  No current outpatient prescriptions on file prior to visit.     patient denies chest pain, shortness of breath, orthopnea. Denies lower extremity edema, abdominal pain, change in appetite, change in bowel movements. Patient denies rashes, musculoskeletal complaints. No other specific complaints in a complete review of systems.   BP 124/70  Pulse 84  Temp 98.7 F (37.1 C) (Oral)  Ht 5' 3.5" (1.613 m)  Wt 150 lb (68.04 kg)  BMI 26.15 kg/m2  Well-developed well-nourished female in no acute distress. HEENT exam atraumatic, normocephalic, extraocular muscles are intact. Neck is supple. No jugular venous distention no thyromegaly. Chest clear to auscultation without increased work of breathing. Cardiac exam S1 and S2  are regular. Abdominal exam active bowel sounds, soft, nontender. Extremities no edema. Neurologic exam she is alert without any motor sensory deficits. Gait is normal.  A/P- Well visit- health maint utd

## 2012-05-07 NOTE — Addendum Note (Signed)
Addended by: Alfred Levins D on: 05/07/2012 07:54 AM   Modules accepted: Orders

## 2012-05-08 LAB — TB SKIN TEST
Induration: 0 mm
TB Skin Test: NEGATIVE

## 2012-05-09 ENCOUNTER — Telehealth: Payer: Self-pay | Admitting: Internal Medicine

## 2012-05-09 MED ORDER — ATORVASTATIN CALCIUM 10 MG PO TABS: 10.0000 mg | ORAL_TABLET | Freq: Every day | ORAL | Status: DC

## 2012-05-09 NOTE — Telephone Encounter (Signed)
Pt aware of results 

## 2012-05-09 NOTE — Telephone Encounter (Signed)
Caller: Abigail Shah/Patient; Phone: [phone]; Reason for Call: Patient is returning call from [facility] the office regarding her Lab work that was done 05/06/12.  She states she knows it is about her Cholesterol and discussed her Thalassemia. She is requesting that Arline Asp please call her back to discuss results

## 2012-05-09 NOTE — Telephone Encounter (Signed)
Pt called and said that she was returning call from nurse. Pls call.

## 2012-05-29 ENCOUNTER — Encounter: Payer: Self-pay | Admitting: Internal Medicine

## 2012-05-29 DIAGNOSIS — E785 Hyperlipidemia, unspecified: Secondary | ICD-10-CM

## 2012-05-29 MED ORDER — ATORVASTATIN CALCIUM 10 MG PO TABS: 10.0000 mg | ORAL_TABLET | Freq: Every day | ORAL | Status: DC

## 2012-07-09 ENCOUNTER — Encounter: Payer: Self-pay | Admitting: Internal Medicine

## 2012-07-11 ENCOUNTER — Encounter: Payer: Self-pay | Admitting: Family Medicine

## 2012-07-11 ENCOUNTER — Ambulatory Visit (INDEPENDENT_AMBULATORY_CARE_PROVIDER_SITE_OTHER): Payer: Medicare Other | Admitting: Family Medicine

## 2012-07-11 VITALS — BP 118/70 | HR 70 | Temp 98.8°F | Wt 148.0 lb

## 2012-07-11 DIAGNOSIS — T675XXA Heat exhaustion, unspecified, initial encounter: Secondary | ICD-10-CM

## 2012-07-11 DIAGNOSIS — R55 Syncope and collapse: Secondary | ICD-10-CM

## 2012-07-11 NOTE — Progress Notes (Signed)
Chief Complaint  Patient presents with  . Near Syncope    on Sunday     HPI:  Pt of Dr. 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 She also had taken a benadryl and had not eaten or had much to drink that day. -this happened one other time when she go over heated a few years ago -reports has chronic low BP and low blood cells due to thalasemmia -hx of: see below -denies: since has felt fine with no cardiac symptoms with exercise, no palpitations or anything else, denies lightheadedness and dizziness with exercise or standing -on review of chart had labs within last few months with PCP  ROS: See pertinent positives and negatives per HPI.  Past Medical History  Diagnosis Date  . Anemia   . Thalassemia   . Skin cancer     hx of scalp  . Hyperlipidemia   . Osteopenia     Family History  Problem Relation Age of Onset  . Dementia Mother   . Hyperlipidemia Father   . Heart attack Father   . Hyperlipidemia Other   . Stroke Other   . Multiple sclerosis Brother     History   Social History  . Marital Status: Married    Spouse Name: N/A    Number of Children: N/A  . Years of Education: N/A   Social History Main Topics  . Smoking status: Never Smoker   . Smokeless tobacco: None  . Alcohol Use: No  . Drug Use: No  . Sexually Active: None   Other Topics Concern  . None   Social History Narrative  . None    Current outpatient prescriptions:atorvastatin (LIPITOR) 10 MG tablet, Take 1 tablet (10 mg total) by mouth daily., Disp: 90 tablet, Rfl: 3;  KRILL OIL 1000 MG CAPS, Take 1 capsule by  mouth daily., Disp: , Rfl:   EXAM:  Filed Vitals:   07/11/12 0920  BP: 118/70  Pulse: 70  Temp:     There is no height on file to calculate BMI.  GENERAL: vitals reviewed and listed above, alert, oriented, appears well hydrated and in no acute distress  HEENT: atraumatic, conjunttiva clear, no obvious abnormalities on inspection of external nose and ears  NECK: no obvious masses on inspection  LUNGS: clear to auscultation bilaterally, no wheezes, rales or rhonchi, good air movement  CV: HRRR, no clicks rubs or murmurs appreciated, no peripheral edema  MS: moves all extremities without noticeable abnormality  PSYCH: pleasant and cooperative, no obvious depression or anxiety  ASSESSMENT AND PLAN:  Discussed the following assessment and plan:  1. Heat exhaustion   2. Syncope    -likely result of dehydration, stress, benadryl and heat - offered labs, EKG today and referral to cardiologist for further eval given age -  But pt refused all of this. She says she feels fine and doesn't want to do any tests today. Just wanted her doctor to know about episode. -orthostatics normal today -Patient advised to return or notify a doctor immediately if and symptoms worsen or persist or new  concerns arise.  Patient Instructions  -see your doctor immediatly if any chest pain, palpitations, dizziness or trouble breathing  -make sure to stay hydrated and would advise you to not use benadryl     Avianah Pellman R.

## 2012-07-11 NOTE — Patient Instructions (Addendum)
-  see your doctor immediatly if any chest pain, palpitations, dizziness or trouble breathing  -make sure to stay hydrated and would advise you to not use benadryl

## 2012-10-23 ENCOUNTER — Other Ambulatory Visit: Payer: Self-pay

## 2012-10-23 DIAGNOSIS — Z1231 Encounter for screening mammogram for malignant neoplasm of breast: Secondary | ICD-10-CM

## 2012-11-22 ENCOUNTER — Ambulatory Visit
Admission: RE | Admit: 2012-11-22 | Discharge: 2012-11-22 | Disposition: A | Discharge status: Home / Self Care | Payer: Medicare Other | Source: Ambulatory Visit

## 2012-11-22 DIAGNOSIS — Z1231 Encounter for screening mammogram for malignant neoplasm of breast: Secondary | ICD-10-CM

## 2013-05-28 ENCOUNTER — Encounter: Payer: Self-pay | Admitting: Internal Medicine

## 2013-05-28 ENCOUNTER — Ambulatory Visit (INDEPENDENT_AMBULATORY_CARE_PROVIDER_SITE_OTHER): Payer: Medicare Other | Admitting: Internal Medicine

## 2013-05-28 VITALS — BP 114/66 | HR 72 | Temp 98.7°F | Ht 63.5 in | Wt 149.0 lb

## 2013-05-28 DIAGNOSIS — D568 Other thalassemias: Secondary | ICD-10-CM

## 2013-05-28 DIAGNOSIS — Z0000 Encounter for general adult medical examination without abnormal findings: Secondary | ICD-10-CM

## 2013-05-28 DIAGNOSIS — Z1211 Encounter for screening for malignant neoplasm of colon: Secondary | ICD-10-CM

## 2013-05-28 DIAGNOSIS — E785 Hyperlipidemia, unspecified: Secondary | ICD-10-CM

## 2013-05-28 LAB — HEPATIC FUNCTION PANEL
AST: 22 U/L (ref 0–37)
Albumin: 4.3 g/dL (ref 3.5–5.2)
Alkaline Phosphatase: 65 U/L (ref 39–117)
Bilirubin, Direct: 0 mg/dL (ref 0.0–0.3)
Total Bilirubin: 0.7 mg/dL (ref 0.3–1.2)
Total Protein: 6.7 g/dL (ref 6.0–8.3)

## 2013-05-28 LAB — CBC WITH DIFFERENTIAL/PLATELET
Basophils Absolute: 0 10*3/uL (ref 0.0–0.1)
Basophils Relative: 0.7 % (ref 0.0–3.0)
Eosinophils Absolute: 0.1 10*3/uL (ref 0.0–0.7)
HCT: 35 % — ABNORMAL LOW (ref 36.0–46.0)
Lymphocytes Relative: 47.2 % — ABNORMAL HIGH (ref 12.0–46.0)
Lymphs Abs: 1.7 10*3/uL (ref 0.7–4.0)
MCHC: 31.9 g/dL (ref 30.0–36.0)
Monocytes Absolute: 0.3 10*3/uL (ref 0.1–1.0)
Monocytes Relative: 9.2 % (ref 3.0–12.0)
Platelets: 187 10*3/uL (ref 150.0–400.0)
RBC: 5.22 Mil/uL — ABNORMAL HIGH (ref 3.87–5.11)
RDW: 15.8 % — ABNORMAL HIGH (ref 11.5–14.6)

## 2013-05-28 LAB — LIPID PANEL
Cholesterol: 191 mg/dL (ref 0–200)
LDL Cholesterol: 113 mg/dL — ABNORMAL HIGH (ref 0–99)
Triglycerides: 68 mg/dL (ref 0.0–149.0)
VLDL: 13.6 mg/dL (ref 0.0–40.0)

## 2013-05-28 MED ORDER — ATORVASTATIN CALCIUM 10 MG PO TABS: 10.0000 mg | ORAL_TABLET | Freq: Every day | ORAL | Status: DC

## 2013-05-28 NOTE — Progress Notes (Signed)
Pre visit review using our clinic review tool, if applicable. No additional management support is needed unless otherwise documented below in the visit note. 

## 2013-05-28 NOTE — Progress Notes (Signed)
Lipids- needs f/u  She feels great and stays very busy  Past Medical History  Diagnosis Date  . Anemia   . Thalassemia   . Skin cancer     hx of scalp  . Hyperlipidemia   . Osteopenia     History   Social History  . Marital Status: Married    Spouse Name: N/A    Number of Children: N/A  . Years of Education: N/A   Occupational History  . Not on file.   Social History Main Topics  . Smoking status: Never Smoker   . Smokeless tobacco: Not on file  . Alcohol Use: No  . Drug Use: No  . Sexual Activity: Not on file   Other Topics Concern  . Not on file   Social History Narrative  . No narrative on file    Past Surgical History  Procedure Laterality Date  . Appendectomy    . Cesarean section    . Skin cancer scalp    . Miscarriage      x2  . Colonoscopy  2003  . Tonsillectomy and adenoidectomy    . Bunionectomy      complicated with fractured toe     Family History  Problem Relation Age of Onset  . Dementia Mother   . Hyperlipidemia Father   . Heart attack Father   . Hyperlipidemia Other   . Stroke Other   . Multiple sclerosis Brother     No Known Allergies  Current Outpatient Prescriptions on File Prior to Visit  Medication Sig Dispense Refill  . atorvastatin (LIPITOR) 10 MG tablet Take 1 tablet (10 mg total) by mouth daily.  90 tablet  3   No current facility-administered medications on file prior to visit.     patient denies chest pain, shortness of breath, orthopnea. Denies lower extremity edema, abdominal pain, change in appetite, change in bowel movements. Patient denies rashes, musculoskeletal complaints. No other specific complaints in a complete review of systems.   BP 114/66  Pulse 72  Temp(Src) 98.7 F (37.1 C) (Oral)  Ht 5' 3.5" (1.613 m)  Wt 149 lb (67.586 kg)  BMI 25.98 kg/m2  Well-developed well-nourished female in no acute distress. HEENT exam atraumatic, normocephalic, extraocular muscles are intact. Neck is supple. No jugular  venous distention no thyromegaly. Chest clear to auscultation without increased work of breathing. Cardiac exam S1 and S2 are regular. Abdominal exam active bowel sounds, soft, nontender. Extremities no edema. Neurologic exam she is alert without any motor sensory deficits. Gait is normal.

## 2013-05-30 NOTE — Assessment & Plan Note (Signed)
Will need lab work

## 2013-05-30 NOTE — Assessment & Plan Note (Signed)
No need for f/u at this time.

## 2013-09-03 ENCOUNTER — Other Ambulatory Visit (HOSPITAL_COMMUNITY): Payer: Self-pay | Admitting: *Deleted

## 2013-09-03 ENCOUNTER — Ambulatory Visit (HOSPITAL_COMMUNITY)
Admission: RE | Admit: 2013-09-03 | Discharge: 2013-09-03 | Disposition: A | Discharge status: Home / Self Care | Payer: Medicare Other | Source: Ambulatory Visit | Attending: Cardiovascular Disease | Admitting: Cardiovascular Disease

## 2013-09-03 ENCOUNTER — Encounter (HOSPITAL_COMMUNITY): Payer: Medicare Other

## 2013-09-03 DIAGNOSIS — M1610 Unilateral primary osteoarthritis, unspecified hip: Secondary | ICD-10-CM

## 2013-09-03 NOTE — Progress Notes (Signed)
Left Lower Extremity Venous Duplex Completed. Negative for DVT and SVT. [place]

## 2013-10-29 ENCOUNTER — Telehealth: Payer: Self-pay | Admitting: Internal Medicine

## 2013-10-29 NOTE — Telephone Encounter (Signed)
Patient Information:  Caller Name: Brytani  Phone: [phone]  Patient: Abigail Shah  Gender: Female  DOB: [birth year]  Age: 73 Years  PCP: Phoebe Sharps (Adults only)  Office Follow Up:  Does the office need to follow up with this patient?: Yes  Instructions For The Office: see notes- requesting Magic Mouthwash....any appt needed?  RN Note:  Triaged per sore throat guideline as it is the closest protocol to pt sxs however, she really states it is more the side and back of tongue vs the throat with maybe a small amount of throat involvement. Pt is requesting a script for Magic Mouthwash. Assured her will send message and someone will f/u with her today.  Symptoms  Reason For Call & Symptoms: C/o soreness to L side of tongue and L back of tongue and a little bit into throat just behind L side of tongue.  Denies any difficulty breathing or swallowing although states  tongue feels very slightly swollen on that side. States she had these very same sxs in past if she ate too many tomatoes or had too much vitamin C, like using Airborne tablets. States several years ago she saw MD for theses sxs and they prescribed magic mouthwash which resolved her sxs. States she has been taking Vit C supplements as instructed by her Ortho MD for the past month (also taking baby ASA and Alieve per Ortho). She wonders if the Vit C caused this issue. Denies any other sxs and states she does not feel ill. No fever. She has looked at that area of tongue and throat in mirror and denies any ulcers or anything visible at all. Does not have tonsils. States it feels a little bit better today than yesterday. Feels best when she eats ice cream.  Reviewed Health History In EMR: Yes  Reviewed Medications In EMR: Yes  Reviewed Allergies In EMR: Yes  Reviewed Surgeries / Procedures: Yes  Date of Onset of Symptoms: 10/25/2013  Treatments Tried: sipping warm liquids, and eating/drinking cold things (ice cream)  Treatments Tried  Worked: Yes  Guideline(s) Used:  Sore Throat  Disposition Per Guideline:   Strep Test Only Visit Today or Tomorrow  Reason For Disposition Reached:   Sore throat is the main symptom and persists > 48 hours  Advice Given:  For Relief of Sore Throat Pain:  Sip warm chicken broth or apple juice.  Suck on hard candy or a throat lozenge (over-the-counter).  Gargle warm salt water 3 times daily (1 teaspoon of salt in 8 oz or 240 ml of warm water).  Soft Diet:   Cold drinks and milk shakes are especially good (Reason: swollen tonsils can make some foods hard to swallow).  Liquids:  Adequate liquid intake is important to prevent dehydration. Drink 6-8 glasses of water per day.  Call Back If:  You become worse.  Patient Will Follow Care Advice:  YES

## 2013-11-04 NOTE — Telephone Encounter (Signed)
Left a message for return call.  

## 2013-11-04 NOTE — Telephone Encounter (Signed)
Call and see if ok

## 2013-11-13 ENCOUNTER — Other Ambulatory Visit: Payer: Self-pay

## 2013-11-13 DIAGNOSIS — Z1231 Encounter for screening mammogram for malignant neoplasm of breast: Secondary | ICD-10-CM

## 2013-11-27 ENCOUNTER — Ambulatory Visit
Admission: RE | Admit: 2013-11-27 | Discharge: 2013-11-27 | Disposition: A | Discharge status: Home / Self Care | Payer: Medicare Other | Source: Ambulatory Visit

## 2013-11-27 DIAGNOSIS — Z1231 Encounter for screening mammogram for malignant neoplasm of breast: Secondary | ICD-10-CM

## 2014-05-05 ENCOUNTER — Encounter: Payer: Medicare Other | Admitting: Family Medicine

## 2014-05-20 ENCOUNTER — Ambulatory Visit (INDEPENDENT_AMBULATORY_CARE_PROVIDER_SITE_OTHER): Payer: Medicare Other | Admitting: Family Medicine

## 2014-05-20 ENCOUNTER — Encounter: Payer: Self-pay | Admitting: Family Medicine

## 2014-05-20 VITALS — BP 120/72 | Temp 98.6°F | Wt 153.0 lb

## 2014-05-20 DIAGNOSIS — M8580 Other specified disorders of bone density and structure, unspecified site: Secondary | ICD-10-CM

## 2014-05-20 DIAGNOSIS — IMO0001 Reserved for inherently not codable concepts without codable children: Secondary | ICD-10-CM

## 2014-05-20 DIAGNOSIS — Z0000 Encounter for general adult medical examination without abnormal findings: Secondary | ICD-10-CM

## 2014-05-20 DIAGNOSIS — D568 Other thalassemias: Secondary | ICD-10-CM

## 2014-05-20 DIAGNOSIS — Z23 Encounter for immunization: Secondary | ICD-10-CM

## 2014-05-20 DIAGNOSIS — D72819 Decreased white blood cell count, unspecified: Secondary | ICD-10-CM

## 2014-05-20 DIAGNOSIS — E785 Hyperlipidemia, unspecified: Secondary | ICD-10-CM

## 2014-05-20 NOTE — Assessment & Plan Note (Signed)
Check lipids today. We discussed even if slightly high I would likely not increase given primary prevention benefit not clear at age 73. We will continue atorvastatin current dose 10mg .

## 2014-05-20 NOTE — Assessment & Plan Note (Signed)
Noted in previous (always present on our labs). Check HIV. Watch trend-consider blood smear/other workup/hematology

## 2014-05-20 NOTE — Assessment & Plan Note (Signed)
2012 with Actonel for ? 1 year. Check DEXA. Consider 5 years fosamax given history of mom with osteopenia and hip fracture/complications ultimately leading to death. Also patient with fractures in march after fall.

## 2014-05-20 NOTE — Progress Notes (Signed)
Abigail Reddish, MD Phone: [phone]  Subjective:  Patient presents today to establish care with me as their new primary care provider. Patient was formerly a patient of Dr. [name]. Chief complaint-noted.   History of thalassemia-father, son, and granddaughter. Confirmed in all 3. Discovered when son was a Ship broker at [employer] and got sick.   Fall in March leading to fractured tibia and fibula and healed fine, tore ACL. Was on aspirin at that time. Slipped over a skateboard. Wore brace.   Osteopenia with last check in 2012 DEXA. States she was treated for a year but she is unsure with what. Chart shows actonel though unclear for how long.   ROS- Full ROS completed and negative. No chest pain or shortness of breath or myalgias.   The following were reviewed and entered/updated in epic: Past Medical History  Diagnosis Date  . Anemia   . Thalassemia   . Skin cancer     hx of scalp  . Hyperlipidemia   . Osteopenia    Patient Active Problem List   Diagnosis Date Noted  . Hyperlipidemia 07/04/2007    Priority: Medium  . THALASSEMIA NEC 02/15/2007    Priority: Medium  . Leukopenia 05/20/2014    Priority: Low  . Osteopenia 08/12/2007    Priority: Low  . SKIN CANCER, HX OF 07/03/2007    Priority: Low   Past Surgical History  Procedure Laterality Date  . Appendectomy    . Cesarean section      x2  . Skin cancer scalp    . Miscarriage      x2  . Colonoscopy  2003  . Tonsillectomy and adenoidectomy    . Bunionectomy      complicated with fractured toe x 2 feet    Family History  Problem Relation Age of Onset  . Dementia Mother   . Hyperlipidemia Father   . Heart attack Father 22  . Hyperlipidemia Other   . Stroke Other   . Multiple sclerosis Brother     actually radiation poisoning    Medications- reviewed and updated Current Outpatient Prescriptions  Medication Sig Dispense Refill  . atorvastatin (LIPITOR) 10 MG tablet Take 1 tablet (10 mg total) by mouth daily.  90 tablet 3  . Calcium Carbonate-Vitamin D (CALCIUM + D PO) Take by mouth.    . Cyanocobalamin (B-12) 1000 MCG SUBL Place under the tongue.     No current facility-administered medications for this visit.    Allergies-reviewed and updated No Known Allergies  History   Social History  . Marital Status: Married    Spouse Name: N/A    Number of Children: N/A  . Years of Education: N/A   Social History Main Topics  . Smoking status: Never Smoker   . Smokeless tobacco: None  . Alcohol Use: No  . Drug Use: No  . Sexual Activity: None   Other Topics Concern  . None   Social History Narrative   Lives alone. Widowed 1987. 2 children. 4 grandkids. No greatgrandkids.    Lived in [place] since going to [employer] college (livesd away for a few years)      Retired from Audiological scientist. Still doing a lot.       Hobbies: a lot of travel because son lives overseas mostly, daughter in [place].    Same church since 1962.       Advanced directives. HCPOA- both children. DNR    ROS--See HPI   Objective: BP 120/72 mmHg  Temp(Src) 98.6 F (37 C)  Wt 153 lb (69.4 kg) Gen: NAD, resting comfortably, appears stated age, gets on table without assist. HEENT: Mucous membranes are moist. Oropharynx normal. Good dention Neck: no thyromegaly. No lymphadenopathy.  CV: RRR no murmurs rubs or gallops Lungs: CTAB no crackles, wheeze, rhonchi Abdomen: soft/nontender/nondistended/normal bowel sounds. No rebound or guarding.  Ext: no edema Skin: warm, dry, no rash Neuro: grossly normal, moves all extremities, PERRLA   Assessment/Plan:  73 y.o. female presenting for annual physical.  Health Maintenance counseling: 1. Anticipatory guidance: Patient counseled regarding regular dental exams, wearing seatbelts, eye exams-cataract bilaterally removed, wearing sunscreen 2. Risk factor reduction:  Advised patient of need for regular exercise (water aerobics 3 mornings) and diet rich and  fruits and vegetables to reduce risk of heart attack and stroke.  3. Immunizations/screenings/ancillary studies- Needs up dated prevnar Health Maintenance Due  Topic Date Due  . COLONOSCOPY -recommended but Last time she had the procedure she blacked out and found herself on the floor. Stool cards. As alternate.  06/20/2011  . INFLUENZA VACCINE - in October 01/17/2014  4. Update labs today- future fasting labs 5. Given overall good health- recommended continuing at least every 2 year mammography  A. Had 3d mammogram 11/27/13 6. Dexa 2012? With history osteopenia- repeat ordered today.  7. Aged out of pap smear 8. Leukopenia also noted-check HIV.   Osteopenia 2012 with Actonel for ? 1 year. Check DEXA. Consider 5 years fosamax given history of mom with osteopenia and hip fracture/complications ultimately leading to death. Also patient with fractures in march after fall.   Hyperlipidemia Check lipids today. We discussed even if slightly high I would likely not increase given primary prevention benefit not clear at age 9. We will continue atorvastatin current dose 10mg .   Leukopenia Noted in previous (always present on our labs). Check HIV. Watch trend-consider blood smear/other workup/hematology   1 year follow up unless lab abnormalities.   Orders Placed This Encounter  Procedures  . DG Bone Density    Standing Status: Future     Number of Occurrences:      Standing Expiration Date: 07/22/2015    Order Specific Question:  Reason for Exam (SYMPTOM  OR DIAGNOSIS REQUIRED)    Answer:  osteopenia    Order Specific Question:  Preferred imaging location?    Answer:  [facility]  . Pneumococcal conjugate vaccine 13-valent  . Comprehensive metabolic panel    Standing Status: Future     Number of Occurrences:      Standing Expiration Date: 06/18/2014  . HIV antibody (with reflex)    Standing Status: Future     Number of Occurrences:      Standing Expiration Date: 06/18/2014  .  TSH    Standing Status: Future     Number of Occurrences:      Standing Expiration Date: 06/18/2014  . CBC with Differential    Standing Status: Future     Number of Occurrences:      Standing Expiration Date: 05/21/2015    Meds ordered this encounter  Medications  . Cyanocobalamin (B-12) 1000 MCG SUBL    Sig: Place under the tongue.  . Calcium Carbonate-Vitamin D (CALCIUM + D PO)    Sig: Take by mouth.

## 2014-05-20 NOTE — Patient Instructions (Addendum)
Keba Please reorder each lab I ordered under [facility] except HIV under leukopenia for FUTURE labs.   Prevnar today  Order DEXA for osteopenia  Stool cards, postpone colonoscopy for 2 years  Ms. Boyadjian  Great to meet you!   See you back in 1 year.   I think  I will likely suggest a medicine for your thin bones.   We decided to hold off on aspirin for now.   Bring stool cards back and come back for fasting labs-schedule visit

## 2014-05-26 ENCOUNTER — Other Ambulatory Visit (INDEPENDENT_AMBULATORY_CARE_PROVIDER_SITE_OTHER): Payer: Medicare Other

## 2014-05-26 DIAGNOSIS — E785 Hyperlipidemia, unspecified: Secondary | ICD-10-CM

## 2014-05-26 DIAGNOSIS — D72819 Decreased white blood cell count, unspecified: Secondary | ICD-10-CM

## 2014-05-26 DIAGNOSIS — D568 Other thalassemias: Secondary | ICD-10-CM

## 2014-05-26 LAB — CBC WITH DIFFERENTIAL/PLATELET
BASOS ABS: 0 10*3/uL (ref 0.0–0.1)
BASOS PCT: 0.7 % (ref 0.0–3.0)
EOS ABS: 0.1 10*3/uL (ref 0.0–0.7)
Eosinophils Relative: 2.6 % (ref 0.0–5.0)
HEMATOCRIT: 36.3 % (ref 36.0–46.0)
HEMOGLOBIN: 11.2 g/dL — AB (ref 12.0–15.0)
LYMPHS ABS: 1.9 10*3/uL (ref 0.7–4.0)
Lymphocytes Relative: 46.4 % — ABNORMAL HIGH (ref 12.0–46.0)
MCHC: 30.8 g/dL (ref 30.0–36.0)
MCV: 68.4 fl — AB (ref 78.0–100.0)
MONO ABS: 0.4 10*3/uL (ref 0.1–1.0)
Monocytes Relative: 9.6 % (ref 3.0–12.0)
NEUTROS ABS: 1.7 10*3/uL (ref 1.4–7.7)
Neutrophils Relative %: 40.7 % — ABNORMAL LOW (ref 43.0–77.0)
Platelets: 225 10*3/uL (ref 150.0–400.0)
RBC: 5.31 Mil/uL — AB (ref 3.87–5.11)
RDW: 15.7 % — AB (ref 11.5–15.5)
WBC: 4.2 10*3/uL (ref 4.0–10.5)

## 2014-05-26 LAB — COMPREHENSIVE METABOLIC PANEL
ALBUMIN: 4.2 g/dL (ref 3.5–5.2)
ALK PHOS: 68 U/L (ref 39–117)
ALT: 14 U/L (ref 0–35)
AST: 20 U/L (ref 0–37)
BILIRUBIN TOTAL: 0.4 mg/dL (ref 0.2–1.2)
BUN: 18 mg/dL (ref 6–23)
CO2: 26 mEq/L (ref 19–32)
Calcium: 9 mg/dL (ref 8.4–10.5)
Chloride: 105 mEq/L (ref 96–112)
Creatinine, Ser: 0.8 mg/dL (ref 0.4–1.2)
GFR: 71.06 mL/min (ref >60.00)
GLUCOSE: 90 mg/dL (ref 70–99)
POTASSIUM: 4.7 meq/L (ref 3.5–5.1)
SODIUM: 140 meq/L (ref 135–145)
Total Protein: 6.5 g/dL (ref 6.0–8.3)

## 2014-05-26 LAB — TSH: TSH: 2.25 u[IU]/mL (ref 0.35–4.50)

## 2014-05-27 ENCOUNTER — Encounter: Payer: Self-pay | Admitting: Family Medicine

## 2014-05-27 LAB — HIV ANTIBODY (ROUTINE TESTING W REFLEX): HIV: NONREACTIVE

## 2014-05-28 ENCOUNTER — Other Ambulatory Visit: Payer: Medicare Other

## 2014-05-28 ENCOUNTER — Other Ambulatory Visit: Payer: Self-pay | Admitting: Family Medicine

## 2014-05-28 DIAGNOSIS — M8580 Other specified disorders of bone density and structure, unspecified site: Secondary | ICD-10-CM

## 2014-05-29 ENCOUNTER — Other Ambulatory Visit: Payer: Medicare Other

## 2014-05-29 ENCOUNTER — Ambulatory Visit (INDEPENDENT_AMBULATORY_CARE_PROVIDER_SITE_OTHER)
Admission: RE | Admit: 2014-05-29 | Discharge: 2014-05-29 | Disposition: A | Discharge status: Home / Self Care | Payer: Medicare Other | Source: Ambulatory Visit | Attending: Family Medicine | Admitting: Family Medicine

## 2014-05-29 DIAGNOSIS — M8580 Other specified disorders of bone density and structure, unspecified site: Secondary | ICD-10-CM

## 2014-05-29 DIAGNOSIS — M810 Age-related osteoporosis without current pathological fracture: Secondary | ICD-10-CM

## 2014-05-31 ENCOUNTER — Encounter: Payer: Self-pay | Admitting: Family Medicine

## 2014-06-04 ENCOUNTER — Encounter: Payer: Self-pay | Admitting: Family Medicine

## 2014-06-04 ENCOUNTER — Other Ambulatory Visit (INDEPENDENT_AMBULATORY_CARE_PROVIDER_SITE_OTHER): Payer: Medicare Other

## 2014-06-04 DIAGNOSIS — R195 Other fecal abnormalities: Secondary | ICD-10-CM

## 2014-06-04 LAB — POC HEMOCCULT BLD/STL (HOME/3-CARD/SCREEN)
Card #1 Date: NEGATIVE
Card #2 Date: NEGATIVE
Card #3 Date: NEGATIVE
Card #3 Fecal Occult Blood, POC: NEGATIVE
FECAL OCCULT BLD: NEGATIVE
Fecal Occult Blood, POC: NEGATIVE

## 2014-06-05 MED ORDER — ALENDRONATE SODIUM 70 MG PO TABS: 70.0000 mg | ORAL_TABLET | ORAL | Status: DC

## 2014-06-05 NOTE — Telephone Encounter (Signed)
Fosamax sent in for osteoporosis

## 2014-08-03 ENCOUNTER — Other Ambulatory Visit: Payer: Self-pay | Admitting: Internal Medicine

## 2014-10-23 ENCOUNTER — Ambulatory Visit (INDEPENDENT_AMBULATORY_CARE_PROVIDER_SITE_OTHER): Payer: Medicare Other | Admitting: Family Medicine

## 2014-10-23 ENCOUNTER — Encounter: Payer: Self-pay | Admitting: Family Medicine

## 2014-10-23 VITALS — BP 104/62 | HR 75 | Temp 99.2°F | Wt 156.0 lb

## 2014-10-23 DIAGNOSIS — N950 Postmenopausal bleeding: Secondary | ICD-10-CM | POA: Diagnosis not present

## 2014-10-23 NOTE — Assessment & Plan Note (Signed)
No obvious source of bleeding on exam. Biggest concern obviously is endometrial cancer. Has not technically seen blood but instead dark spots on panties but doubt this would be caused by another source and no discharge on exam. Patient likely needs transvaginal ultrasound and endometrial biopsy. We do not have equipment for endometrial biopsy and while could order TVUS, oftentimes makes more sense to have at gyn office. Patient opts to have complete workup at gyn and she has been referred. She has never seen a gyn. i cannot find last pap but patient denies ever having abnormal pap smear.

## 2014-10-23 NOTE — Progress Notes (Signed)
Garret Reddish, MD  Subjective:  Abigail Shah is a 74 y.o. year old very pleasant female patient who presents with:  Vaginal spotting/postmenopausal bleeding -Friend a month ago diagnosed with endometrial cancer off of a pap smear in her 45s without symptoms. Patient states this was a shock to her. She has not had a pap smear and was already concerned then About a week ago noted spotting on front of panties a dark color. Has happened about once a day, dark spot on white.   ROS- no blood with wiping, no dysuria, polyuria, hematuria Past Medical History- hyperlipidemia, osteoporosis Medications- reviewed and updated Current Outpatient Prescriptions  Medication Sig Dispense Refill  . alendronate (FOSAMAX) 70 MG tablet Take 1 tablet (70 mg total) by mouth every 7 (seven) days. Take with a full glass of water on an empty stomach. 4 tablet 11  . atorvastatin (LIPITOR) 10 MG tablet TAKE ONE TABLET BY MOUTH ONCE DAILY 90 tablet 0  . Calcium Carbonate-Vitamin D (CALCIUM + D PO) Take by mouth.    . Cyanocobalamin (B-12) 1000 MCG SUBL Place under the tongue.     Objective: BP 104/62 mmHg  Pulse 75  Temp(Src) 99.2 F (37.3 C)  Wt 156 lb (70.761 kg) Gen: NAD, resting comfortably in chair Abdomen: soft/nontender/nondistended/normal bowel sounds. No rebound or guarding.  Ext: no edema Skin: warm, dry, no rash Pelvic: cervix normal in appearance, external genitalia normal, no adnexal masses or tenderness, no cervical motion tenderness, uterus normal size, shape, and consistency and vagina normal without discharge  Assessment/Plan:  Postmenopausal bleeding No obvious source of bleeding on exam. Biggest concern obviously is endometrial cancer. Has not technically seen blood but instead dark spots on panties but doubt this would be caused by another source and no discharge on exam. Patient likely needs transvaginal ultrasound and endometrial biopsy. We do not have equipment for endometrial biopsy  and while could order TVUS, oftentimes makes more sense to have at gyn office. Patient opts to have complete workup at gyn and she has been referred. She has never seen a gyn. i cannot find last pap but patient denies ever having abnormal pap smear.    Return precautions advised.   Orders Placed This Encounter  Procedures  . Ambulatory referral to Gynecology    Referral Priority:  Routine    Referral Type:  Consultation    Referral Reason:  Specialty Services Required    Requested Specialty:  Gynecology    Number of Visits Requested:  1  did not order CBC as spotting not likely significant in regards to hemoglobin.

## 2014-10-23 NOTE — Patient Instructions (Signed)
I do not see any cause of spotting. I think we have to evaluate you for postmenopausal bleeding. We could order the ultrasound but not the endometrial biopsy. For this reason, I have referred you to gynecology at this time. We will call you within a week about your referral. If you do not hear within 2 weeks, give [place] a call.

## 2014-11-26 ENCOUNTER — Other Ambulatory Visit: Payer: Self-pay

## 2014-11-26 DIAGNOSIS — Z1231 Encounter for screening mammogram for malignant neoplasm of breast: Secondary | ICD-10-CM

## 2014-11-30 ENCOUNTER — Other Ambulatory Visit: Payer: Self-pay | Admitting: Family Medicine

## 2014-12-02 ENCOUNTER — Ambulatory Visit
Admission: RE | Admit: 2014-12-02 | Discharge: 2014-12-02 | Disposition: A | Discharge status: Home / Self Care | Payer: Medicare Other | Source: Ambulatory Visit

## 2014-12-02 DIAGNOSIS — Z1231 Encounter for screening mammogram for malignant neoplasm of breast: Secondary | ICD-10-CM

## 2015-09-07 ENCOUNTER — Other Ambulatory Visit: Payer: Self-pay | Admitting: Family Medicine

## 2015-11-09 ENCOUNTER — Other Ambulatory Visit: Payer: Self-pay | Admitting: Physician Assistant

## 2015-11-24 ENCOUNTER — Encounter: Payer: Self-pay | Admitting: Family Medicine

## 2015-11-24 ENCOUNTER — Ambulatory Visit (INDEPENDENT_AMBULATORY_CARE_PROVIDER_SITE_OTHER): Payer: Medicare Other | Admitting: Family Medicine

## 2015-11-24 VITALS — BP 120/80 | HR 71 | Temp 98.0°F | Ht 62.5 in | Wt 148.0 lb

## 2015-11-24 DIAGNOSIS — E785 Hyperlipidemia, unspecified: Secondary | ICD-10-CM

## 2015-11-24 DIAGNOSIS — Z0000 Encounter for general adult medical examination without abnormal findings: Secondary | ICD-10-CM | POA: Diagnosis not present

## 2015-11-24 DIAGNOSIS — Z1211 Encounter for screening for malignant neoplasm of colon: Secondary | ICD-10-CM

## 2015-11-24 LAB — CBC WITH DIFFERENTIAL/PLATELET
BASOS ABS: 0 10*3/uL (ref 0.0–0.1)
Basophils Relative: 1 % (ref 0.0–3.0)
EOS ABS: 0.1 10*3/uL (ref 0.0–0.7)
Eosinophils Relative: 1.3 % (ref 0.0–5.0)
HCT: 36.3 % (ref 36.0–46.0)
Hemoglobin: 11.5 g/dL — ABNORMAL LOW (ref 12.0–15.0)
Lymphocytes Relative: 41.8 % (ref 12.0–46.0)
Lymphs Abs: 1.8 10*3/uL (ref 0.7–4.0)
MCHC: 31.7 g/dL (ref 30.0–36.0)
MCV: 67.3 fl — ABNORMAL LOW (ref 78.0–100.0)
Monocytes Absolute: 0.4 10*3/uL (ref 0.1–1.0)
Monocytes Relative: 9 % (ref 3.0–12.0)
NEUTROS ABS: 2.1 10*3/uL (ref 1.4–7.7)
Neutrophils Relative %: 46.9 % (ref 43.0–77.0)
PLATELETS: 247 10*3/uL (ref 150.0–400.0)
RBC: 5.4 Mil/uL — AB (ref 3.87–5.11)
RDW: 15.8 % — ABNORMAL HIGH (ref 11.5–15.5)
WBC: 4.4 10*3/uL (ref 4.0–10.5)

## 2015-11-24 LAB — COMPREHENSIVE METABOLIC PANEL
ALK PHOS: 52 U/L (ref 39–117)
ALT: 15 U/L (ref 0–35)
AST: 19 U/L (ref 0–37)
Albumin: 4.3 g/dL (ref 3.5–5.2)
BUN: 19 mg/dL (ref 6–23)
CHLORIDE: 106 meq/L (ref 96–112)
CO2: 28 meq/L (ref 19–32)
Calcium: 9.2 mg/dL (ref 8.4–10.5)
Creatinine, Ser: 0.8 mg/dL (ref 0.40–1.20)
GFR: 73.85 mL/min (ref >60.00)
GLUCOSE: 92 mg/dL (ref 70–99)
POTASSIUM: 3.9 meq/L (ref 3.5–5.1)
SODIUM: 140 meq/L (ref 135–145)
TOTAL PROTEIN: 6.8 g/dL (ref 6.0–8.3)
Total Bilirubin: 0.7 mg/dL (ref 0.2–1.2)

## 2015-11-24 LAB — LIPID PANEL
CHOL/HDL RATIO: 3
Cholesterol: 183 mg/dL (ref 0–200)
HDL: 59 mg/dL (ref >39.00)
LDL CALC: 110 mg/dL — AB (ref 0–99)
NONHDL: 124.48
Triglycerides: 73 mg/dL (ref 0.0–149.0)
VLDL: 14.6 mg/dL (ref 0.0–40.0)

## 2015-11-24 MED ORDER — ATORVASTATIN CALCIUM 10 MG PO TABS: 10.0000 mg | ORAL_TABLET | Freq: Every day | ORAL | Status: DC

## 2015-11-24 MED ORDER — ALENDRONATE SODIUM 70 MG PO TABS: 70.0000 mg | ORAL_TABLET | ORAL | Status: DC

## 2015-11-24 NOTE — Progress Notes (Signed)
Phone: [phone]  Subjective:  Patient presents today for their annual physical. Chief complaint-noted.   See problem oriented charting- ROS- full  review of systems was completed and negative including No chest pain or shortness of breath. No headache or blurry vision.   The following were reviewed and entered/updated in epic: Past Medical History  Diagnosis Date  . Anemia   . Thalassemia   . Skin cancer     hx of scalp  . Hyperlipidemia   . Osteopenia    Patient Active Problem List   Diagnosis Date Noted  . Hyperlipidemia 07/04/2007    Priority: Medium  . THALASSEMIA NEC 02/15/2007    Priority: Medium  . Leukopenia 05/20/2014    Priority: Low  . Osteoporosis 08/12/2007    Priority: Low  . SKIN CANCER, HX OF 07/03/2007    Priority: Low  . Postmenopausal bleeding 10/23/2014   Past Surgical History  Procedure Laterality Date  . Appendectomy    . Cesarean section      x2  . Skin cancer scalp    . Miscarriage      x2  . Colonoscopy  2003  . Tonsillectomy and adenoidectomy    . Bunionectomy      complicated with fractured toe x 2 feet  . Cataract extraction      bilateral    Family History  Problem Relation Age of Onset  . Dementia Mother   . Hyperlipidemia Father   . Heart attack Father 18  . Hyperlipidemia Other   . Stroke Other   . Multiple sclerosis Brother     actually radiation poisoning    Medications- reviewed and updated Current Outpatient Prescriptions  Medication Sig Dispense Refill  . alendronate (FOSAMAX) 70 MG tablet Take 1 tablet (70 mg total) by mouth every 7 (seven) days. Take with a full glass of water on an empty stomach. 4 tablet 11  . atorvastatin (LIPITOR) 10 MG tablet Take 1 tablet (10 mg total) by mouth daily. 91 tablet 3  . Calcium Carbonate-Vitamin D (CALCIUM + D PO) Take by mouth.    . Cyanocobalamin (B-12) 1000 MCG SUBL Place under the tongue.     No current facility-administered medications for this visit.     Allergies-reviewed and updated No Known Allergies  Social History   Social History  . Marital Status: Married    Spouse Name: N/A  . Number of Children: N/A  . Years of Education: N/A   Social History Main Topics  . Smoking status: Never Smoker   . Smokeless tobacco: None  . Alcohol Use: No  . Drug Use: No  . Sexual Activity: Not Asked   Other Topics Concern  . None   Social History Narrative   Lives alone. Widowed 1987. 2 children. 4 grandkids. No greatgrandkids.    Lived in [place] since going to [employer] college (livesd away for a few years)      Retired from Audiological scientist. Still doing a lot.       Hobbies: a lot of travel because son lives overseas mostly, daughter in [place].    Same church since 1962.       Advanced directives. HCPOA- both children. DNR    Objective: BP 120/80 mmHg  Pulse 71  Temp(Src) 98 F (36.7 C) (Oral)  Ht 5' 2.5" (1.588 m)  Wt 148 lb (67.132 kg)  BMI 26.62 kg/m2  SpO2 95% Gen: NAD, resting comfortably HEENT: Mucous membranes are moist. Oropharynx normal Neck: no thyromegaly  CV: RRR no murmurs rubs or gallops Lungs: CTAB no crackles, wheeze, rhonchi Abdomen: soft/nontender/nondistended/normal bowel sounds. No rebound or guarding.  Ext: no edema Skin: warm, dry, no rash Neuro: grossly normal, moves all extremities, PERRLA  Declined GYN and breast exam.   Assessment/Plan:  75 y.o. female presenting for annual physical.  Health Maintenance counseling: 1. Anticipatory guidance: Patient counseled regarding regular dental exams, eye exams, wearing seatbelts.  2. Risk factor reduction:  Advised patient of need for regular exercise and diet rich and fruits and vegetables to reduce risk of heart attack and stroke. Water aerobics 3 days a week and walk some. Diet reasonable. 5 lbs weight loss- has been trying.  3. Immunizations/screenings/ancillary studies Immunization History  Administered Date(s) Administered  .  DTaP 01/20/2011  . Hepatitis A 04/27/2006, 11/01/2006  . Hepatitis B 04/27/2006, 05/28/2006, 11/01/2006  . Influenza Split 03/27/2011, 04/03/2012  . Influenza Whole 03/19/2006, 03/17/2009  . Influenza,inj,Quad PF,36+ Mos 04/14/2013  . Influenza-Unspecified 04/02/2014, 04/02/2015  . Meningococcal Polysaccharide 04/12/2006  . PPD Test 05/06/2012  . Pneumococcal Conjugate-13 05/20/2014  . Pneumococcal Polysaccharide-23 03/19/2004  . Td 03/19/2006  . Zoster 04/12/2006  4. Cervical cancer screening- passed age based screening 5. Breast cancer screening-  Passed usptf age based screening, mammogram 12/02/14 patient opted in 6. Colon cancer screening - do not have prior colonoscopy report from 2003, patient opted for yearly stool cards after fainting episode before colonoscopy last time and lives alone 7. Skin cancer screening- Recently saw Dr. [place] and sees yearly with 3 month repeat planned  Status of chronic or acute concerns  Hyperlipidemia- on atorvastatin 10mg - no recent lipid check, update today Lab Results  Component Value Date   CHOL 191 05/28/2013   HDL 64.50 05/28/2013   LDLCALC 113* 05/28/2013   LDLDIRECT 179.8 05/06/2012   TRIG 68.0 05/28/2013   CHOLHDL 3 05/28/2013   Osteoporosis- diagnosed 05/2014 and has been on fosamax for almost 2 years. Compliant with calcium and vitmain D- repeat bone density next year.   Leukopenia in past- repeat CBC. Known thalassemia   Return in about 1 year (around 11/23/2016) for physical. Return precautions advised.   Orders Placed This Encounter  Procedures  . CBC with Differential/Platelet  . Comprehensive metabolic panel    [place]    Order Specific Question:  Has the patient fasted?    Answer:  No  . Lipid panel    [place]    Order Specific Question:  Has the patient fasted?    Answer:  No  . POC Hemoccult Bld/Stl (3-Cd Home Screen)    Send home    Standing Status: Future     Number of Occurrences:      Standing Expiration  Date: 11/23/2016    Meds ordered this encounter  Medications  . alendronate (FOSAMAX) 70 MG tablet    Sig: Take 1 tablet (70 mg total) by mouth every 7 (seven) days. Take with a full glass of water on an empty stomach.    Dispense:  4 tablet    Refill:  11  . atorvastatin (LIPITOR) 10 MG tablet    Sig: Take 1 tablet (10 mg total) by mouth daily.    Dispense:  91 tablet    Refill:  3    Garret Reddish, MD

## 2015-11-24 NOTE — Progress Notes (Signed)
Pre visit review using our clinic review tool, if applicable. No additional management support is needed unless otherwise documented below in the visit note. 

## 2015-11-24 NOTE — Patient Instructions (Addendum)
Only change in meds- bump the calcium to 1000mg , get other 200mg  through diet  Repeat bone density after next years physical  Labs before you leave- even if cholesterol slightly high I would likely not increase medicine unless VERY high  Do not run out of fosamax- send me a message if this happens again  I would also like for you to sign up for an annual wellness visit on a Friday with our nurse Manuela Schwartz. This is a free benefit under medicare that may help [place] find additional ways to help you.   Verbal- pick up stool cards

## 2015-11-29 ENCOUNTER — Other Ambulatory Visit (INDEPENDENT_AMBULATORY_CARE_PROVIDER_SITE_OTHER): Payer: Medicare Other

## 2015-11-29 DIAGNOSIS — Z1211 Encounter for screening for malignant neoplasm of colon: Secondary | ICD-10-CM | POA: Diagnosis not present

## 2015-11-29 LAB — POC HEMOCCULT BLD/STL (HOME/3-CARD/SCREEN)
Card #3 Fecal Occult Blood, POC: NEGATIVE
FECAL OCCULT BLD: NEGATIVE
Fecal Occult Blood, POC: NEGATIVE

## 2015-12-02 ENCOUNTER — Other Ambulatory Visit: Payer: Self-pay | Admitting: Family Medicine

## 2015-12-02 DIAGNOSIS — Z1231 Encounter for screening mammogram for malignant neoplasm of breast: Secondary | ICD-10-CM

## 2015-12-17 ENCOUNTER — Ambulatory Visit
Admission: RE | Admit: 2015-12-17 | Discharge: 2015-12-17 | Disposition: A | Discharge status: Home / Self Care | Payer: Medicare Other | Source: Ambulatory Visit | Attending: Family Medicine | Admitting: Family Medicine

## 2015-12-17 DIAGNOSIS — Z1231 Encounter for screening mammogram for malignant neoplasm of breast: Secondary | ICD-10-CM

## 2016-06-26 ENCOUNTER — Telehealth: Payer: Self-pay | Admitting: Family Medicine

## 2016-06-26 NOTE — Telephone Encounter (Signed)
Pt is trying to change her life insurance policy and they have questions that they would like for her PCP to answer to see if the pt needs some additional test and pelvic US.  Pt would like to speak with Dr. Yong Channel to go in detail about what she is speaking about.

## 2016-06-26 NOTE — Telephone Encounter (Signed)
Since "detailed conversation" is needed and question of imaging- I would advise office visit to discuss.   Abigail Shah- if it really is something simple that can leave a message for that is another thing.

## 2016-06-27 NOTE — Telephone Encounter (Signed)
Pt scheduled  

## 2016-06-29 ENCOUNTER — Encounter: Payer: Self-pay | Admitting: Family Medicine

## 2016-06-29 ENCOUNTER — Ambulatory Visit (INDEPENDENT_AMBULATORY_CARE_PROVIDER_SITE_OTHER): Payer: Medicare Other | Admitting: Family Medicine

## 2016-06-29 VITALS — BP 128/62 | HR 79 | Temp 98.9°F | Ht 62.5 in | Wt 155.2 lb

## 2016-06-29 DIAGNOSIS — Z8742 Personal history of other diseases of the female genital tract: Secondary | ICD-10-CM | POA: Diagnosis not present

## 2016-06-29 NOTE — Progress Notes (Signed)
Pre visit review using our clinic review tool, if applicable. No additional management support is needed unless otherwise documented below in the visit note. 

## 2016-06-29 NOTE — Patient Instructions (Signed)
We will call you within a week or two about your referral to gynecology. If you do not hear within 3 weeks, give [place] a call.

## 2016-06-29 NOTE — Progress Notes (Signed)
Subjective:  Abigail Shah is a 76 y.o. year old very pleasant female patient who presents for/with See problem oriented charting ROS- no vaginal or rectal bleeding, no abnormal fatigue, no lower abdominal pain   Past Medical History-  Patient Active Problem List   Diagnosis Date Noted  . Hyperlipidemia 07/04/2007    Priority: Medium  . THALASSEMIA NEC 02/15/2007    Priority: Medium  . Leukopenia 05/20/2014    Priority: Low  . Osteoporosis 08/12/2007    Priority: Low  . SKIN CANCER, HX OF 07/03/2007    Priority: Low  . Postmenopausal bleeding 10/23/2014    Medications- reviewed and updated Current Outpatient Prescriptions  Medication Sig Dispense Refill  . alendronate (FOSAMAX) 70 MG tablet Take 1 tablet (70 mg total) by mouth every 7 (seven) days. Take with a full glass of water on an empty stomach. 4 tablet 11  . atorvastatin (LIPITOR) 10 MG tablet Take 1 tablet (10 mg total) by mouth daily. 91 tablet 3  . Calcium Carbonate-Vitamin D (CALCIUM + D PO) Take by mouth.    . Cyanocobalamin (B-12) 1000 MCG SUBL Place under the tongue.     No current facility-administered medications for this visit.     Objective: BP 128/62 (BP Location: Left Arm, Patient Position: Sitting, Cuff Size: Normal)   Pulse 79   Temp 98.9 F (37.2 C) (Oral)   Ht 5' 2.5" (1.588 m)   Wt 155 lb 3.2 oz (70.4 kg)   SpO2 97%   BMI 27.93 kg/m  Gen: NAD, resting comfortably in chair  Assessment/Plan:  History of postmenopausal bleeding - Plan: Ambulatory referral to Gynecology S: Patient was seen here 10/23/14. The following HPI was written at that time  "Vaginal spotting/postmenopausal bleeding -Friend a month ago diagnosed with endometrial cancer off of a pap smear in her 14s without symptoms. Patient states this was a shock to her. She has not had a pap smear and was already concerned then About a week ago noted spotting on front of panties a dark color. Has happened about once a day, dark spot on  white. "  We completed a pelvic exam at the time that was very uncomfortable for patient. I did not see an obvious source of bleeding. IT was difficult to determine at that time if this was truly bleeding but we determined safest course of action would be to rule out endometrial cancer with referral to GN to consider transvaginal ultrasound and endometrial cancer. Patient at that time denied every having abnormal pap smear- I had no record of prior paps. We did not complete a pap smear that day.   Patient states due to discomfort of exam and fact she had no further discharge, she did not proceed forward with gyneocological referral. I saw patient a year later on 11/24/15 for CPE and told her she did not need pap smear as a result of being passed age based recommendations for screening. I did not realize at the time that she had not followed through with the gynecology referral and did not appropriately refer her back to gynecology at that time.   A/P: Extended discussion today. Considering she has not had any spotting again and it has now been over 18 months, suspect endometrial cancer probability is very low. I question whether endometrial biopsy and transvaginal ultrsound would even be indicated at this point, now 18 months out.  That being said,we will hedge on the safe side and refer to [facility]- patient would  prefer Dr. Sabra Heck if available. I assured her the other provider's are also excellent.   I would also note she has stable anemia due to thalessemia and no signs of blood loss on last 2 sets of labs Lab Results  Component Value Date   WBC 4.4 11/24/2015   HGB 11.5 (L) 11/24/2015   HCT 36.3 11/24/2015   MCV 67.3 Repeated and verified X2. (L) 11/24/2015   PLT 247.0 11/24/2015   Orders Placed This Encounter  Procedures  . Ambulatory referral to Gynecology    Referral Priority:   Routine    Referral Type:   Consultation    Referral Reason:   Specialty Services Required     Requested Specialty:   Gynecology    Number of Visits Requested:   1   The duration of face-to-face time during this visit was greater than 15 minutes. Greater than 50% of this time was spent in counseling about appropriate follow up.   Return precautions advised.  Garret Reddish, MD

## 2016-07-03 ENCOUNTER — Encounter: Payer: Self-pay | Admitting: Obstetrics & Gynecology

## 2016-07-03 ENCOUNTER — Ambulatory Visit (INDEPENDENT_AMBULATORY_CARE_PROVIDER_SITE_OTHER): Payer: Medicare Other | Admitting: Obstetrics & Gynecology

## 2016-07-03 VITALS — BP 134/78 | HR 82 | Resp 14 | Ht 62.75 in | Wt 156.2 lb

## 2016-07-03 DIAGNOSIS — Z0000 Encounter for general adult medical examination without abnormal findings: Secondary | ICD-10-CM | POA: Diagnosis not present

## 2016-07-03 DIAGNOSIS — Z124 Encounter for screening for malignant neoplasm of cervix: Secondary | ICD-10-CM | POA: Diagnosis not present

## 2016-07-03 DIAGNOSIS — N950 Postmenopausal bleeding: Secondary | ICD-10-CM | POA: Diagnosis not present

## 2016-07-03 LAB — POCT URINALYSIS DIPSTICK
BILIRUBIN UA: NEGATIVE
GLUCOSE UA: NEGATIVE
KETONES UA: NEGATIVE
Leukocytes, UA: NEGATIVE
Nitrite, UA: NEGATIVE
Protein, UA: NEGATIVE
RBC UA: NEGATIVE
Urobilinogen, UA: NEGATIVE
pH, UA: 5

## 2016-07-03 NOTE — Progress Notes (Signed)
76 y.o. [id number] Married Caucasian F here as a new patient.  She had an episode of possible PMP bleeding about two years ago but did not have an evaluation.  She is having some adjustments to insurance and it was recommended that she proceed with evaluation at this time.  She has not had any further bleeding.  She denies vaginal discharge.  She denies rectal bleeding.  She has no pelvic pain.  No LMP recorded. Patient is postmenopausal.          Sexually active: No.  The current method of family planning is post menopausal status.    Exercising: Yes.    water aerobics 3x weekly  Smoker:  no  Health Maintenance: Pap:  Unsure of date of last pap ~5-7 years History of abnormal Pap:  no MMG:  12/20/15 BIRADS 1 negative  Colonoscopy:  06/19/01, patient states she does stool cards every year.  Pt had post procedure issues and does not want to do another one. BMD:   05/31/14 osteoporosis  TDaP:  01/20/11  Pneumonia vaccine(s):  03/19/04, 05/20/14  Zostavax:   04/12/16  Hep C testing: not indicated  Screening Labs: PCP, Hb today: PCP, Urine today: normal    reports that she has never smoked. She has never used smokeless tobacco. She reports that she does not drink alcohol or use drugs.  Past Medical History:  Diagnosis Date  . Anemia   . Hyperlipidemia   . Osteopenia   . Skin cancer    hx of scalp  . Thalassemia     Past Surgical History:  Procedure Laterality Date  . APPENDECTOMY    . BUNIONECTOMY     complicated with fractured toe x 2 feet  . CATARACT EXTRACTION     bilateral  . CESAREAN SECTION     x2  . COLONOSCOPY  2003  . DILATION AND CURETTAGE OF UTERUS    . miscarriage     x2  . skin cancer scalp    . TONSILLECTOMY AND ADENOIDECTOMY    . TUBAL LIGATION      Current Outpatient Prescriptions  Medication Sig Dispense Refill  . alendronate (FOSAMAX) 70 MG tablet Take 1 tablet (70 mg total) by mouth every 7 (seven) days. Take with a full glass of water on an empty stomach. 4  tablet 11  . atorvastatin (LIPITOR) 10 MG tablet Take 1 tablet (10 mg total) by mouth daily. 91 tablet 3  . Calcium Carbonate-Vitamin D (CALCIUM + D PO) Take by mouth.    . Cyanocobalamin (B-12) 1000 MCG SUBL Place under the tongue.     No current facility-administered medications for this visit.     Family History  Problem Relation Age of Onset  . Dementia Mother   . Hyperlipidemia Father   . Heart attack Father 64  . Multiple sclerosis Brother     actually radiation poisoning  . Hyperlipidemia Other   . Stroke Other     ROS:  Pertinent items are noted in HPI.  Otherwise, a comprehensive ROS was negative.  Exam:   BP 134/78 (BP Location: Left Arm, Patient Position: Sitting, Cuff Size: Normal)   Pulse 82   Resp 14   Ht 5' 2.75" (1.594 m)   Wt 156 lb 3.2 oz (70.9 kg)   BMI 27.89 kg/m     Height: 5' 2.75" (159.4 cm)  Ht Readings from Last 3 Encounters:  07/03/16 5' 2.75" (1.594 m)  06/29/16 5' 2.5" (1.588 m)  11/24/15 5' 2.5" (  1.588 m)    General appearance: alert, cooperative and appears stated age Head: Normocephalic, without obvious abnormality, atraumatic Breasts: normal appearance, no masses or tenderness Abdomen: soft, non-tender; bowel sounds normal; no masses,  no organomegaly Extremities: extremities normal, atraumatic, no cyanosis or edema Skin: Skin color, texture, turgor normal. No rashes or lesions Lymph nodes: Cervical, supraclavicular, and axillary nodes normal. No abnormal inguinal nodes palpated Neurologic: Grossly normal  Pelvic: External genitalia:  no lesions              Urethra:  normal appearing urethra with no masses, tenderness or lesions              Bartholins and Skenes: normal                 Vagina: normal appearing vagina with normal color and discharge, no lesions              Cervix: no lesions              Pap taken: Yes.   Bimanual Exam:  Uterus:  normal size, contour, position, consistency, mobility, non-tender              Adnexa:  normal adnexa and no mass, fullness, tenderness               Rectovaginal: Confirms               Anus:  normal sphincter tone, no lesions  Chaperone was present for exam.  A:  Well Woman with normal exam Episode of PMP bleeding about two years ago with incomplete evaluation that insurance has recommended now Osteoporosis, on Fosamax Elevated lipids  P:   Mammogram guidelines reviewed.  She is up to date. pap smear obtained today Pt will return for PUS and if findings are normal, then letter will be written to [email or web address], [employer] Return annually or prn

## 2016-07-05 LAB — IPS PAP SMEAR ONLY

## 2016-07-06 ENCOUNTER — Other Ambulatory Visit: Payer: Medicare Other | Admitting: Obstetrics & Gynecology

## 2016-07-06 ENCOUNTER — Other Ambulatory Visit: Payer: Medicare Other

## 2016-07-11 ENCOUNTER — Ambulatory Visit (INDEPENDENT_AMBULATORY_CARE_PROVIDER_SITE_OTHER): Payer: Medicare Other | Admitting: Obstetrics & Gynecology

## 2016-07-11 ENCOUNTER — Encounter: Payer: Self-pay | Admitting: Obstetrics & Gynecology

## 2016-07-11 ENCOUNTER — Ambulatory Visit (INDEPENDENT_AMBULATORY_CARE_PROVIDER_SITE_OTHER): Payer: Medicare Other

## 2016-07-11 VITALS — BP 131/66 | HR 72 | Resp 14 | Ht 62.75 in | Wt 152.0 lb

## 2016-07-11 DIAGNOSIS — D251 Intramural leiomyoma of uterus: Secondary | ICD-10-CM | POA: Diagnosis not present

## 2016-07-11 DIAGNOSIS — N950 Postmenopausal bleeding: Secondary | ICD-10-CM | POA: Diagnosis not present

## 2016-07-11 DIAGNOSIS — N83201 Unspecified ovarian cyst, right side: Secondary | ICD-10-CM | POA: Diagnosis not present

## 2016-07-11 NOTE — Progress Notes (Signed)
76 y.o. [id number] widowed Caucasian female here for pelvic ultrasound due to history of PMP bleeding in 2015 that was not completely evaluated.  Her insurance is requiring additional evaluation despite not have any bleeding since that time.  In fact, she is not completely sure what she saw was PMP anyway but that is not besides the point.  Denies any current gyn symptoms today..  No LMP recorded. Patient is postmenopausal.  Contraception: PMP  Findings:  UTERUS: 4.8 x 4.7 x 1.9cm with left lateral fundal fibroid 1.4cm EMS:1.31mm, symmetric ADNEXA: Left ovary: 1.4 x 0.9 x 0.6cm       Right ovary: 1.3 x 1.1 x 1.2cm with small 7 x 48mm simple appearing cyst CUL DE SAC:  No free fluid  Discussion:  Findings reviewed with pt.  As endometrium is thin and symmetric, do not feel an endometrial biopsy is needed or warranted.  Of course, we reviewed if she were to have any future bleeding, she needs to call immediately.  Fibroid finding and small left ovarian simple cyst findings reviewed.  Do not feel either of these needs follow up unless new symptoms/problems develop.  Pt comfortable with plan.  Assessment:  Questional hx of PMP bleeding 2015, with non since.  Thin endometrium.  Do not feel biopsy is needed. Fundal fibroid 66mm simple ovarian cyst  Plan:  Pt will follow up prn.  Do not feel any ultrasound finding needs specific follow up at this time.  Pt comfortable with plan. Letter written to advise of above recommendations to person of pt's request.  ~20 minutes spent with patient >50% of time was in face to face discussion of above.

## 2016-07-13 ENCOUNTER — Encounter: Payer: Self-pay | Admitting: Obstetrics & Gynecology

## 2016-11-27 ENCOUNTER — Other Ambulatory Visit: Payer: Self-pay | Admitting: Family Medicine

## 2016-11-27 DIAGNOSIS — Z1231 Encounter for screening mammogram for malignant neoplasm of breast: Secondary | ICD-10-CM

## 2016-12-11 ENCOUNTER — Telehealth: Payer: Self-pay | Admitting: Family Medicine

## 2016-12-11 NOTE — Telephone Encounter (Signed)
Patient dropped off [facility] Doctor's Referral   FAX: [place] [facility] [place] at [phone] ATTN: Michelene Heady Disposition: [place]

## 2016-12-12 ENCOUNTER — Other Ambulatory Visit: Payer: Self-pay | Admitting: Family Medicine

## 2016-12-14 ENCOUNTER — Other Ambulatory Visit: Payer: Self-pay

## 2016-12-14 ENCOUNTER — Ambulatory Visit (INDEPENDENT_AMBULATORY_CARE_PROVIDER_SITE_OTHER): Payer: Medicare Other | Admitting: Family Medicine

## 2016-12-14 ENCOUNTER — Encounter: Payer: Self-pay | Admitting: Family Medicine

## 2016-12-14 ENCOUNTER — Telehealth: Payer: Self-pay | Admitting: Family Medicine

## 2016-12-14 VITALS — BP 126/56 | HR 80 | Temp 99.8°F | Ht 63.25 in | Wt 161.0 lb

## 2016-12-14 DIAGNOSIS — D72819 Decreased white blood cell count, unspecified: Secondary | ICD-10-CM

## 2016-12-14 DIAGNOSIS — M810 Age-related osteoporosis without current pathological fracture: Secondary | ICD-10-CM

## 2016-12-14 DIAGNOSIS — Z1211 Encounter for screening for malignant neoplasm of colon: Secondary | ICD-10-CM | POA: Diagnosis not present

## 2016-12-14 DIAGNOSIS — N950 Postmenopausal bleeding: Secondary | ICD-10-CM

## 2016-12-14 DIAGNOSIS — Z0000 Encounter for general adult medical examination without abnormal findings: Secondary | ICD-10-CM

## 2016-12-14 DIAGNOSIS — E785 Hyperlipidemia, unspecified: Secondary | ICD-10-CM | POA: Diagnosis not present

## 2016-12-14 MED ORDER — ATORVASTATIN CALCIUM 10 MG PO TABS: 10.0000 mg | ORAL_TABLET | Freq: Every day | ORAL | 3 refills | Status: DC

## 2016-12-14 MED ORDER — ALENDRONATE SODIUM 70 MG PO TABS: ORAL_TABLET | ORAL | 11 refills | Status: DC

## 2016-12-14 NOTE — Progress Notes (Signed)
Phone: [phone]  Subjective:  Patient presents today for their annual physical. Chief complaint-noted.   See problem oriented charting- ROS- full  review of systems was completed and negative including No chest pain or shortness of breath. No headache or blurry vision.   The following were reviewed and entered/updated in epic: Past Medical History:  Diagnosis Date  . Anemia   . Hyperlipidemia   . Osteopenia   . Skin cancer    hx of scalp  . Thalassemia    Patient Active Problem List   Diagnosis Date Noted  . Hyperlipidemia 07/04/2007    Priority: Medium  . THALASSEMIA NEC 02/15/2007    Priority: Medium  . Postmenopausal bleeding 10/23/2014    Priority: Low  . Leukopenia 05/20/2014    Priority: Low  . Osteoporosis 08/12/2007    Priority: Low  . SKIN CANCER, HX OF 07/03/2007    Priority: Low   Past Surgical History:  Procedure Laterality Date  . APPENDECTOMY    . BUNIONECTOMY     complicated with fractured toe x 2 feet  . CATARACT EXTRACTION     bilateral  . CESAREAN SECTION     x2  . COLONOSCOPY  2003  . DILATION AND CURETTAGE OF UTERUS    . miscarriage     x2  . skin cancer scalp    . TONSILLECTOMY AND ADENOIDECTOMY    . TUBAL LIGATION      Family History  Problem Relation Age of Onset  . Dementia Mother   . Hyperlipidemia Father   . Heart attack Father 17  . Multiple sclerosis Brother        actually radiation poisoning  . Hyperlipidemia Other   . Stroke Other     Medications- reviewed and updated Current Outpatient Prescriptions  Medication Sig Dispense Refill  . Calcium Carbonate-Vitamin D (CALCIUM + D PO) Take by mouth.    . Cyanocobalamin (B-12) 1000 MCG SUBL Place under the tongue.    Marland Kitchen alendronate (FOSAMAX) 70 MG tablet TAKE ONE TABLET BY MOUTH EVERY 7 DAYS. TAKE WITH A FULL GLASS OF WATER ON AN EMPTY STOMACH 4 tablet 11  . atorvastatin (LIPITOR) 10 MG tablet Take 1 tablet (10 mg total) by mouth daily. 91 tablet 3   No current  facility-administered medications for this visit.     Allergies-reviewed and updated No Known Allergies  Social History   Social History  . Marital status: Married    Spouse name: N/A  . Number of children: N/A  . Years of education: N/A   Social History Main Topics  . Smoking status: Never Smoker  . Smokeless tobacco: Never Used  . Alcohol use No  . Drug use: No  . Sexual activity: No   Other Topics Concern  . None   Social History Narrative   Lives alone. Widowed 1987. 2 children. 4 grandkids. No greatgrandkids.    Lived in [place] since going to [employer] college (livesd away for a few years)      Retired from Audiological scientist. Still doing a lot.       Hobbies: a lot of travel because son lives overseas mostly, daughter in [place].    Same church since 1962.       Advanced directives. HCPOA- both children. DNR    Objective: BP (!) 126/56 (BP Location: Left Arm, Patient Position: Sitting, Cuff Size: Large)   Pulse 80   Temp 99.8 F (37.7 C) (Oral)   Ht 5' 3.25" (1.607 m)  Wt 161 lb (73 kg)   SpO2 97%   BMI 28.29 kg/m  Gen: NAD, resting comfortably HEENT: Mucous membranes are moist. Oropharynx normal Neck: no thyromegaly CV: RRR no murmurs rubs or gallops Lungs: CTAB no crackles, wheeze, rhonchi Abdomen: soft/nontender/nondistended/normal bowel sounds. No rebound or guarding.  Ext: no edema Skin: warm, dry Neuro: grossly normal, moves all extremities, PERRLA  Assessment/Plan:  76 y.o. female presenting for annual physical.  Health Maintenance counseling: 1. Anticipatory guidance: Patient counseled regarding regular dental exams - q6 months, eye exams - readers only- consider follow up at least every 2 years, wearing seatbelts.  2. Risk factor reduction:  Advised patient of need for regular exercise and diet rich and fruits and vegetables to reduce risk of heart attack and stroke. Exercise- 3 mornings a week to water aerobics. Diet-weight up 6  lbs from our last visit in January- has been doing some stress eating- is going to work on reversing this trend.  Wt Readings from Last 3 Encounters:  12/14/16 161 lb (73 kg)  07/11/16 152 lb (68.9 kg)  07/03/16 156 lb 3.2 oz (70.9 kg)  3. Immunizations/screenings/ancillary studies- 2012 had DTap so would wait until 2022.  Offered shingrix as well - will consider at her pharmacy Immunization History  Administered Date(s) Administered  . DTaP 01/20/2011  . Hepatitis A 04/27/2006, 11/01/2006  . Hepatitis B 04/27/2006, 05/28/2006, 11/01/2006  . Influenza Split 03/27/2011, 04/03/2012  . Influenza Whole 03/19/2006, 03/17/2009  . Influenza,inj,Quad PF,36+ Mos 04/14/2013  . Influenza-Unspecified 04/02/2014, 04/02/2015  . Meningococcal Polysaccharide 04/12/2006  . PPD Test 05/06/2012  . Pneumococcal Conjugate-13 05/20/2014  . Pneumococcal Polysaccharide-23 03/19/2004  . Td 03/19/2006  . Zoster 04/12/2006  4. Cervical cancer screening- aged out 63. Breast cancer screening- passed screening guidelines but continues yearly mammograms. Planned on July 2nd.  6. Colon cancer screening - fainting episode before last colonoscopy. Opts for stool cards instead 7. Skin cancer screening- follows with Dr. [place] per her report -  Status of chronic or acute concerns   Hyperlipidemia- mild por control on atorvastatin 10mg  on last check with LDL 110. No planned increased unless LDL above 130- she agrees.   Leukopenia - mild in past and nto noted on last labs (HIV negative 2015)- also has known thalessemia- update CBC  Osteoporosis from 2015 and has been on fosamax almost 3 years at this point. Also on calcium/vitamin D. Update bone density  Had a good friend die this spring and has another friend stage IV colon cancer  Postmenopausal bleeding ? Postmenopausal bleeding 2915- with no bleeding since then. Had ultrasound and was reassuring- no further follow up suggested by Dr. Sabra Heck   Return in about 1  year (around 12/14/2017) for physical.  Orders Placed This Encounter  Procedures  . CBC with Differential/Platelet    Standing Status:   Future    Standing Expiration Date:   12/14/2017  . Comprehensive metabolic panel    [place]    Standing Status:   Future    Standing Expiration Date:   12/14/2017  . Lipid panel    Standing Status:   Future    Standing Expiration Date:   12/14/2017  . POC Hemoccult Bld/Stl (3-Cd Home Screen)    Send home    Standing Status:   Future    Standing Expiration Date:   12/14/2017   Return precautions advised.  Garret Reddish, MD

## 2016-12-14 NOTE — Patient Instructions (Addendum)
Schedule your bone density test at check out desk (or can call back) Also please schedule future fasting labs as well as your annual wellness visit  Schedule a lab visit within 2 weeks. Return for future fasting labs meaning nothing but water after midnight please. Ok to take your medications with water.   I think you are doing great! Love how active you are. Only thing I would change is to trim off those 5 extra lbs

## 2016-12-14 NOTE — Telephone Encounter (Signed)
See below

## 2016-12-14 NOTE — Telephone Encounter (Signed)
7/26 is fine with me as long as insurance will pay. Thanks for helping our patients out Juliann Pulse!

## 2016-12-14 NOTE — Telephone Encounter (Signed)
Pt's AVS states to return in 2 wks for fasting labs.  Pt wants to do AWV and labs the same day.  However, no early am visits with Manuela Schwartz until 7/26. The insurance will pay for labs as long as done within 30 days of CPE. So 7/26 labs will be ok. Does pt necessarily have to return in 2 weeks for you, or is 7/26 ok for labs and visit with Manuela Schwartz? Pt wants to make sure OK with you.

## 2016-12-14 NOTE — Assessment & Plan Note (Signed)
?   Postmenopausal bleeding 2915- with no bleeding since then. Had ultrasound and was reassuring- no further follow up suggested by Dr. Sabra Heck

## 2016-12-15 NOTE — Telephone Encounter (Signed)
Pt is aware.  

## 2016-12-18 ENCOUNTER — Ambulatory Visit
Admission: RE | Admit: 2016-12-18 | Discharge: 2016-12-18 | Disposition: A | Discharge status: Home / Self Care | Payer: Medicare Other | Source: Ambulatory Visit | Attending: Family Medicine | Admitting: Family Medicine

## 2016-12-18 DIAGNOSIS — Z1231 Encounter for screening mammogram for malignant neoplasm of breast: Secondary | ICD-10-CM

## 2016-12-27 ENCOUNTER — Other Ambulatory Visit: Payer: Self-pay | Admitting: Physician Assistant

## 2016-12-27 DIAGNOSIS — D229 Melanocytic nevi, unspecified: Secondary | ICD-10-CM | POA: Diagnosis not present

## 2016-12-27 DIAGNOSIS — D224 Melanocytic nevi of scalp and neck: Secondary | ICD-10-CM | POA: Diagnosis not present

## 2016-12-27 DIAGNOSIS — D492 Neoplasm of unspecified behavior of bone, soft tissue, and skin: Secondary | ICD-10-CM | POA: Diagnosis not present

## 2016-12-27 DIAGNOSIS — L719 Rosacea, unspecified: Secondary | ICD-10-CM | POA: Diagnosis not present

## 2016-12-29 DIAGNOSIS — H2513 Age-related nuclear cataract, bilateral: Secondary | ICD-10-CM | POA: Diagnosis not present

## 2016-12-29 NOTE — Progress Notes (Addendum)
Subjective:   Abigail Shah is a 76 y.o. female who presents for an Initial Medicare Annual Wellness Visit.  Review of Systems    No ROS.  Medicare Wellness Visit. Additional risk factors are reflected in the social history.   Cardiac Risk Factors include: advanced age (>41men, >59 women);diabetes mellitus Sleep patterns:  8-9 hrs/night. Wakes up feeling rested.  Home Safety/Smoke Alarms: Feels safe in home. Smoke alarms in place. Security system in place.  Living environment; residence and Firearm Safety: Pt has a townhouse and lives alone. Pt recently applied to move in to [place] Safety/Bike Helmet: Wears seat belt.   Counseling:   Dental- Every 6 months.   Female:   Pap-    07/03/2016 Dr Hale Bogus.    Mammo-      12/18/2016 Normal. Dexa scan-  05/29/2014 Osteoporosis. Scheduled for 01/11/17. CCS-   06/19/2001 No record. 12/14/16 POC Hemoccult ordered. Hemoccult card dropped of in lab.     Objective:    Today's Vitals   01/01/17 1135  BP: 120/68  Pulse: 76  Resp: 12  SpO2: 97%  Weight: 161 lb (73 kg)  Height: 5\' 3"  (1.6 m)   Body mass index is 28.52 kg/m.   Current Medications (verified) Outpatient Encounter Prescriptions as of 01/01/2017  Medication Sig  . alendronate (FOSAMAX) 70 MG tablet TAKE ONE TABLET BY MOUTH EVERY 7 DAYS. TAKE WITH A FULL GLASS OF WATER ON AN EMPTY STOMACH  . atorvastatin (LIPITOR) 10 MG tablet Take 1 tablet (10 mg total) by mouth daily.  . Calcium Carbonate-Vitamin D (CALCIUM + D PO) Take by mouth.  . Cyanocobalamin (B-12) 1000 MCG SUBL Place under the tongue.   No facility-administered encounter medications on file as of 01/01/2017.     Allergies (verified) Patient has no known allergies.   History: Past Medical History:  Diagnosis Date  . Anemia   . Hyperlipidemia   . Osteopenia   . Skin cancer    hx of scalp  . Thalassemia    Past Surgical History:  Procedure Laterality Date  . APPENDECTOMY    .  BUNIONECTOMY     complicated with fractured toe x 2 feet  . CATARACT EXTRACTION     bilateral  . CESAREAN SECTION     x2  . COLONOSCOPY  2003  . DILATION AND CURETTAGE OF UTERUS    . miscarriage     x2  . skin cancer scalp    . TONSILLECTOMY AND ADENOIDECTOMY    . TUBAL LIGATION     Family History  Problem Relation Age of Onset  . Dementia Mother   . Hyperlipidemia Father   . Heart attack Father 40  . Multiple sclerosis Brother        actually radiation poisoning  . Hyperlipidemia Other   . Stroke Other   . Breast cancer Neg Hx    Social History   Occupational History  . Not on file.   Social History Main Topics  . Smoking status: Never Smoker  . Smokeless tobacco: Never Used  . Alcohol use No  . Drug use: No  . Sexual activity: No    Tobacco Counseling Counseling given: Not Answered   Activities of Daily Living In your present state of health, do you have any difficulty performing the following activities: 01/01/2017  Hearing? N  Vision? N  Difficulty concentrating or making decisions? N  Walking or climbing stairs? N  Dressing or bathing? N  Doing errands,  shopping? N  Preparing Food and eating ? N  Using the Toilet? N  In the past six months, have you accidently leaked urine? Y  Do you have problems with loss of bowel control? N  Managing your Medications? N  Managing your Finances? N  Housekeeping or managing your Housekeeping? N  Some recent data might be hidden    Immunizations and Health Maintenance Immunization History  Administered Date(s) Administered  . DTaP 01/20/2011  . Hepatitis A 04/27/2006, 11/01/2006  . Hepatitis B 04/27/2006, 05/28/2006, 11/01/2006  . Influenza Split 03/27/2011, 04/03/2012  . Influenza Whole 03/19/2006, 03/17/2009  . Influenza,inj,Quad PF,36+ Mos 04/14/2013  . Influenza-Unspecified 04/02/2014, 04/02/2015  . Meningococcal Polysaccharide 04/12/2006  . PPD Test 05/06/2012  . Pneumococcal Conjugate-13 05/20/2014  .  Pneumococcal Polysaccharide-23 03/19/2004  . Td 03/19/2006  . Zoster 04/12/2006   There are no preventive care reminders to display for this patient.  Patient Care Team: Marin Olp, MD as PCP - General (Family Medicine)  Indicate any recent Medical Services you may have received from other than [facility] providers in the past year (date may be approximate).     Assessment:   This is a routine wellness examination for [place]. Physical assessment deferred to PCP.   Hearing/Vision screen Hearing Screening Comments: Able to hear conversational tones w/o difficulty. No issues reported. Had her [facility].  Vision Screening Comments: Wears reading glasses. Went last week for an eye exam. No deficits.  Dietary issues and exercise activities discussed: Current Exercise Habits: Structured exercise class, Type of exercise: Other - see comments (water aerobics), Time (Minutes): 60, Frequency (Times/Week): 3, Weekly Exercise (Minutes/Week): 180, Intensity: Moderate   Diet (meal preparation, eat out, water intake, caffeinated beverages, dairy products, fruits and vegetables): 3 meals/day. Snacks on cheese stick, apples, crackers. 2 cups coffee. 5 glasses water. Rare soda or juice.  Breakfast: Cottage cheese and fruit or egg and bacon.  Lunch: Salad with meat or sandwich.  Dinner: Meat and two vegetables or salad.  Pt states that she has gained weight from eating out due to two friends being sick and her being busy with them. Pt is working on loosing the weight.      Goals    . Get to 150 lbs.       Depression Screen PHQ 2/9 Scores 01/01/2017 12/14/2016 11/24/2015 10/23/2014 05/06/2012  PHQ - 2 Score 0 0 0 0 0    Fall Risk Fall Risk  01/01/2017 12/14/2016 11/24/2015 10/23/2014  Falls in the past year? No No Yes No  Injury with Fall? - - No -    Cognitive Function:   Ad8 score reviewed for issues:  Issues making decisions:no  Less interest in hobbies /  activities:no  Repeats questions, stories (family complaining):no  Trouble using ordinary gadgets (microwave, computer, phone):no  Forgets the month or year: no  Mismanaging finances: no  Remembering appts:no  Daily problems with thinking and/or memory:no Ad8 score is=0       Screening Tests Health Maintenance  Topic Date Due  . INFLUENZA VACCINE  01/17/2017  . TETANUS/TDAP  01/19/2021  . DEXA SCAN  Completed  . PNA vac Low Risk Adult  Completed      Plan:   Bone density scheduled for 01/11/17.  Bring a copy of your advance directives to your next office visit.  Follow up as planned to get blood work completed.    Hemoccult card dropped off in lab today.  I have personally reviewed  and noted the following in the patient's chart:   . Medical and social history . Use of alcohol, tobacco or illicit drugs  . Current medications and supplements . Functional ability and status . Nutritional status . Physical activity . Advanced directives . List of other physicians . Vitals . Screenings to include cognitive, depression, and falls . Referrals and appointments  In addition, I have reviewed and discussed with patient certain preventive protocols, quality metrics, and best practice recommendations. A written personalized care plan for preventive services as well as general preventive health recommendations were provided to patient.     Ree Edman, RN   01/01/2017

## 2016-12-29 NOTE — Progress Notes (Signed)
Pre visit review using our clinic review tool, if applicable. No additional management support is needed unless otherwise documented below in the visit note. 

## 2017-01-01 ENCOUNTER — Ambulatory Visit (INDEPENDENT_AMBULATORY_CARE_PROVIDER_SITE_OTHER): Payer: Medicare Other

## 2017-01-01 VITALS — BP 120/68 | HR 76 | Resp 12 | Ht 63.0 in | Wt 161.0 lb

## 2017-01-01 DIAGNOSIS — Z0000 Encounter for general adult medical examination without abnormal findings: Secondary | ICD-10-CM

## 2017-01-01 LAB — POC HEMOCCULT BLD/STL (HOME/3-CARD/SCREEN)
Card #3 Fecal Occult Blood, POC: NEGATIVE
FECAL OCCULT BLD: NEGATIVE
Fecal Occult Blood, POC: NEGATIVE

## 2017-01-01 NOTE — Progress Notes (Signed)
I have reviewed and agree with note, evaluation, plan. She opts for yearly stool cards and came back negative today.   Results for orders placed or performed in visit on 12/14/16 (from the past 24 hour(s))  POC Hemoccult Bld/Stl (3-Cd Home Screen)     Status: None   Collection Time: 01/01/17 11:00 AM  Result Value Ref Range   Card #1 Date 12/29/2016    Fecal Occult Blood, POC Negative Negative   Card #2 Date 12/30/2016    Card #2 Fecal Occult Blod, POC Negative    Card #3 Date 01/01/2017    Card #3 Fecal Occult Blood, POC Negative    Garret Reddish, MD

## 2017-01-01 NOTE — Addendum Note (Signed)
Addended by: Denna Haggard K on: 01/01/2017 11:00 AM   Modules accepted: Orders

## 2017-01-01 NOTE — Patient Instructions (Signed)
Bring a copy of your advance directives to your next office visit.  Preventive Care 78 Years and Older, Female Preventive care refers to lifestyle choices and visits with your health care provider that can promote health and wellness. What does preventive care include?  A yearly physical exam. This is also called an annual well check.  Dental exams once or twice a year.  Routine eye exams. Ask your health care provider how often you should have your eyes checked.  Personal lifestyle choices, including: ? Daily care of your teeth and gums. ? Regular physical activity. ? Eating a healthy diet. ? Avoiding tobacco and drug use. ? Limiting alcohol use. ? Practicing safe sex. ? Taking low-dose aspirin every day. ? Taking vitamin and mineral supplements as recommended by your health care provider. What happens during an annual well check? The services and screenings done by your health care provider during your annual well check will depend on your age, overall health, lifestyle risk factors, and family history of disease. Counseling Your health care provider may ask you questions about your:  Alcohol use.  Tobacco use.  Drug use.  Emotional well-being.  Home and relationship well-being.  Sexual activity.  Eating habits.  History of falls.  Memory and ability to understand (cognition).  Work and work Statistician.  Reproductive health.  Screening You may have the following tests or measurements:  Height, weight, and BMI.  Blood pressure.  Lipid and cholesterol levels. These may be checked every 5 years, or more frequently if you are over 66 years old.  Skin check.  Lung cancer screening. You may have this screening every year starting at age 60 if you have a 30-pack-year history of smoking and currently smoke or have quit within the past 15 years.  Fecal occult blood test (FOBT) of the stool. You may have this test every year starting at age 57.  Flexible  sigmoidoscopy or colonoscopy. You may have a sigmoidoscopy every 5 years or a colonoscopy every 10 years starting at age 34.  Hepatitis C blood test.  Hepatitis B blood test.  Sexually transmitted disease (STD) testing.  Diabetes screening. This is done by checking your blood sugar (glucose) after you have not eaten for a while (fasting). You may have this done every 1-3 years.  Bone density scan. This is done to screen for osteoporosis. You may have this done starting at age 27.  Mammogram. This may be done every 1-2 years. Talk to your health care provider about how often you should have regular mammograms.  Talk with your health care provider about your test results, treatment options, and if necessary, the need for more tests. Vaccines Your health care provider may recommend certain vaccines, such as:  Influenza vaccine. This is recommended every year.  Tetanus, diphtheria, and acellular pertussis (Tdap, Td) vaccine. You may need a Td booster every 10 years.  Varicella vaccine. You may need this if you have not been vaccinated.  Zoster vaccine. You may need this after age 85.  Measles, mumps, and rubella (MMR) vaccine. You may need at least one dose of MMR if you were born in 1957 or later. You may also need a second dose.  Pneumococcal 13-valent conjugate (PCV13) vaccine. One dose is recommended after age 41.  Pneumococcal polysaccharide (PPSV23) vaccine. One dose is recommended after age 57.  Meningococcal vaccine. You may need this if you have certain conditions.  Hepatitis A vaccine. You may need this if you have certain conditions or if  you travel or work in places where you may be exposed to hepatitis A.  Hepatitis B vaccine. You may need this if you have certain conditions or if you travel or work in places where you may be exposed to hepatitis B.  Haemophilus influenzae type b (Hib) vaccine. You may need this if you have certain conditions.  Talk to your health  care provider about which screenings and vaccines you need and how often you need them. This information is not intended to replace advice given to you by your health care provider. Make sure you discuss any questions you have with your health care provider. Document Released: 07/02/2015 Document Revised: 02/23/2016 Document Reviewed: 04/06/2015 Elsevier Interactive Patient Education  2017 Elsevier Inc.  

## 2017-01-11 ENCOUNTER — Other Ambulatory Visit: Payer: Self-pay | Admitting: Family Medicine

## 2017-01-11 ENCOUNTER — Ambulatory Visit (INDEPENDENT_AMBULATORY_CARE_PROVIDER_SITE_OTHER)
Admission: RE | Admit: 2017-01-11 | Discharge: 2017-01-11 | Disposition: A | Discharge status: Home / Self Care | Payer: Medicare Other | Source: Ambulatory Visit | Attending: Family Medicine | Admitting: Family Medicine

## 2017-01-11 ENCOUNTER — Other Ambulatory Visit (INDEPENDENT_AMBULATORY_CARE_PROVIDER_SITE_OTHER): Payer: Medicare Other

## 2017-01-11 ENCOUNTER — Ambulatory Visit: Payer: Medicare Other

## 2017-01-11 DIAGNOSIS — D72819 Decreased white blood cell count, unspecified: Secondary | ICD-10-CM | POA: Diagnosis not present

## 2017-01-11 DIAGNOSIS — M810 Age-related osteoporosis without current pathological fracture: Secondary | ICD-10-CM | POA: Diagnosis not present

## 2017-01-11 DIAGNOSIS — Z0000 Encounter for general adult medical examination without abnormal findings: Secondary | ICD-10-CM | POA: Diagnosis not present

## 2017-01-11 DIAGNOSIS — E785 Hyperlipidemia, unspecified: Secondary | ICD-10-CM

## 2017-01-11 LAB — LIPID PANEL
CHOLESTEROL: 205 mg/dL — AB (ref 0–200)
HDL: 54 mg/dL (ref >39.00)
LDL Cholesterol: 132 mg/dL — ABNORMAL HIGH (ref 0–99)
NonHDL: 151.42
Total CHOL/HDL Ratio: 4
Triglycerides: 99 mg/dL (ref 0.0–149.0)
VLDL: 19.8 mg/dL (ref 0.0–40.0)

## 2017-01-11 LAB — CBC WITH DIFFERENTIAL/PLATELET
Basophils Absolute: 0 10*3/uL (ref 0.0–0.1)
Basophils Relative: 0.9 % (ref 0.0–3.0)
EOS PCT: 1.9 % (ref 0.0–5.0)
Eosinophils Absolute: 0.1 10*3/uL (ref 0.0–0.7)
HCT: 37.9 % (ref 36.0–46.0)
HEMOGLOBIN: 11.9 g/dL — AB (ref 12.0–15.0)
LYMPHS ABS: 2.1 10*3/uL (ref 0.7–4.0)
Lymphocytes Relative: 49.9 % — ABNORMAL HIGH (ref 12.0–46.0)
MCHC: 31.4 g/dL (ref 30.0–36.0)
MONOS PCT: 9.8 % (ref 3.0–12.0)
Monocytes Absolute: 0.4 10*3/uL (ref 0.1–1.0)
Neutro Abs: 1.5 10*3/uL (ref 1.4–7.7)
Neutrophils Relative %: 37.5 % — ABNORMAL LOW (ref 43.0–77.0)
Platelets: 221 10*3/uL (ref 150.0–400.0)
RBC: 5.69 Mil/uL — AB (ref 3.87–5.11)
RDW: 16.1 % — ABNORMAL HIGH (ref 11.5–15.5)
WBC: 4.1 10*3/uL (ref 4.0–10.5)

## 2017-01-11 LAB — COMPREHENSIVE METABOLIC PANEL
ALBUMIN: 4.2 g/dL (ref 3.5–5.2)
ALK PHOS: 43 U/L (ref 39–117)
ALT: 16 U/L (ref 0–35)
AST: 18 U/L (ref 0–37)
BUN: 16 mg/dL (ref 6–23)
CO2: 29 mEq/L (ref 19–32)
Calcium: 9.4 mg/dL (ref 8.4–10.5)
Chloride: 106 mEq/L (ref 96–112)
Creatinine, Ser: 0.9 mg/dL (ref 0.40–1.20)
GFR: 64.28 mL/min (ref >60.00)
Glucose, Bld: 91 mg/dL (ref 70–99)
POTASSIUM: 4.7 meq/L (ref 3.5–5.1)
Sodium: 140 mEq/L (ref 135–145)
TOTAL PROTEIN: 6.4 g/dL (ref 6.0–8.3)
Total Bilirubin: 0.7 mg/dL (ref 0.2–1.2)

## 2017-01-11 MED ORDER — ATORVASTATIN CALCIUM 20 MG PO TABS: 20.0000 mg | ORAL_TABLET | Freq: Every day | ORAL | 3 refills | Status: DC

## 2017-01-12 ENCOUNTER — Encounter: Payer: Self-pay | Admitting: Family Medicine

## 2017-01-15 ENCOUNTER — Telehealth: Payer: Self-pay | Admitting: Family Medicine

## 2017-01-15 NOTE — Telephone Encounter (Signed)
Spoke with patient and reviewed over lab results 

## 2017-01-15 NOTE — Telephone Encounter (Signed)
Pt is returning jamie call °

## 2017-02-01 ENCOUNTER — Other Ambulatory Visit: Payer: Self-pay | Admitting: Physician Assistant

## 2017-02-01 DIAGNOSIS — L089 Local infection of the skin and subcutaneous tissue, unspecified: Secondary | ICD-10-CM | POA: Diagnosis not present

## 2017-02-01 DIAGNOSIS — D485 Neoplasm of uncertain behavior of skin: Secondary | ICD-10-CM | POA: Diagnosis not present

## 2017-04-17 DIAGNOSIS — Z23 Encounter for immunization: Secondary | ICD-10-CM | POA: Diagnosis not present

## 2017-05-16 DIAGNOSIS — L910 Hypertrophic scar: Secondary | ICD-10-CM | POA: Diagnosis not present

## 2017-05-16 DIAGNOSIS — D229 Melanocytic nevi, unspecified: Secondary | ICD-10-CM | POA: Diagnosis not present

## 2017-08-28 ENCOUNTER — Ambulatory Visit (INDEPENDENT_AMBULATORY_CARE_PROVIDER_SITE_OTHER): Payer: Medicare Other | Admitting: Family Medicine

## 2017-08-28 ENCOUNTER — Encounter: Payer: Self-pay | Admitting: Family Medicine

## 2017-08-28 VITALS — BP 120/68 | HR 86 | Temp 99.5°F | Wt 158.0 lb

## 2017-08-28 DIAGNOSIS — J069 Acute upper respiratory infection, unspecified: Secondary | ICD-10-CM | POA: Diagnosis not present

## 2017-08-28 DIAGNOSIS — B9789 Other viral agents as the cause of diseases classified elsewhere: Secondary | ICD-10-CM

## 2017-08-28 MED ORDER — HYDROCODONE-HOMATROPINE 5-1.5 MG/5ML PO SYRP: ORAL_SOLUTION | ORAL | 0 refills | Status: DC

## 2017-08-28 NOTE — Progress Notes (Signed)
Abigail Shah is a 77 year old widowed female nonsmoker,,,,,,,,,, [name],,,, who comes in today for evaluation of a cold  She's had cold symptoms for the past 6 days. Head congestion runny no sore throat and cough. No fever chills  Review of systems otherwise negative..  Vs BP 120/68 (BP Location: Left Arm, Patient Position: Sitting, Cuff Size: Normal)   Pulse 86   Temp 99.5 F (37.5 C) (Oral)   Wt 158 lb (71.7 kg)   BMI 27.99 kg/m  Well-developed well-nourished female no acute distress vital signs stable she's afebrile HEENT were negative neck was supple no adenopathy lungs are clear  #1 viral syndrome plan treat symptomatically

## 2017-08-28 NOTE — Patient Instructions (Signed)
Drink lots of water  Hydromet......Marland Kitchen 1/2-1 teaspoon at bedtime for nighttime cough  This viral infection last 2-3 weeks  If you have any questions concerns call Dr. Yong Channel your primary care physician

## 2018-01-04 ENCOUNTER — Other Ambulatory Visit: Payer: Self-pay | Admitting: Family Medicine

## 2018-01-11 ENCOUNTER — Other Ambulatory Visit: Payer: Self-pay | Admitting: Family Medicine

## 2018-01-23 ENCOUNTER — Other Ambulatory Visit: Payer: Self-pay | Admitting: Family Medicine

## 2018-01-23 DIAGNOSIS — Z1231 Encounter for screening mammogram for malignant neoplasm of breast: Secondary | ICD-10-CM

## 2018-02-20 ENCOUNTER — Other Ambulatory Visit: Payer: Self-pay | Admitting: Physician Assistant

## 2018-02-20 DIAGNOSIS — D229 Melanocytic nevi, unspecified: Secondary | ICD-10-CM | POA: Diagnosis not present

## 2018-02-20 DIAGNOSIS — L821 Other seborrheic keratosis: Secondary | ICD-10-CM | POA: Diagnosis not present

## 2018-02-20 DIAGNOSIS — D485 Neoplasm of uncertain behavior of skin: Secondary | ICD-10-CM | POA: Diagnosis not present

## 2018-02-20 DIAGNOSIS — L814 Other melanin hyperpigmentation: Secondary | ICD-10-CM | POA: Diagnosis not present

## 2018-02-21 ENCOUNTER — Ambulatory Visit
Admission: RE | Admit: 2018-02-21 | Discharge: 2018-02-21 | Disposition: A | Discharge status: Home / Self Care | Payer: Medicare Other | Source: Ambulatory Visit | Attending: Family Medicine | Admitting: Family Medicine

## 2018-02-21 DIAGNOSIS — Z1231 Encounter for screening mammogram for malignant neoplasm of breast: Secondary | ICD-10-CM

## 2018-03-14 ENCOUNTER — Other Ambulatory Visit: Payer: Self-pay

## 2018-03-14 NOTE — Patient Outreach (Signed)
[facility]) Care Management  03/14/2018  Abigail Shah 08-16-1940 [id number]   Medication Adherence call to Abigail Shah left a message for patient to call back patient is due on Atorvastatin 10 mg. Abigail Shah is showing past due under [facility].   [place] Management Direct Dial [phone]  Fax [phone] [email or web address]

## 2018-03-18 ENCOUNTER — Ambulatory Visit (INDEPENDENT_AMBULATORY_CARE_PROVIDER_SITE_OTHER): Payer: Medicare Other | Admitting: Family Medicine

## 2018-03-18 ENCOUNTER — Encounter: Payer: Self-pay | Admitting: Family Medicine

## 2018-03-18 VITALS — BP 114/62 | HR 76 | Temp 98.2°F | Ht 63.0 in | Wt 160.4 lb

## 2018-03-18 DIAGNOSIS — Z0000 Encounter for general adult medical examination without abnormal findings: Secondary | ICD-10-CM | POA: Diagnosis not present

## 2018-03-18 DIAGNOSIS — E785 Hyperlipidemia, unspecified: Secondary | ICD-10-CM | POA: Diagnosis not present

## 2018-03-18 LAB — CBC
HEMATOCRIT: 37 % (ref 36.0–46.0)
Hemoglobin: 11.9 g/dL — ABNORMAL LOW (ref 12.0–15.0)
MCHC: 32.3 g/dL (ref 30.0–36.0)
PLATELETS: 236 10*3/uL (ref 150.0–400.0)
RBC: 5.63 Mil/uL — ABNORMAL HIGH (ref 3.87–5.11)
RDW: 15.4 % (ref 11.5–15.5)
WBC: 5.7 10*3/uL (ref 4.0–10.5)

## 2018-03-18 LAB — COMPREHENSIVE METABOLIC PANEL
ALK PHOS: 54 U/L (ref 39–117)
ALT: 18 U/L (ref 0–35)
AST: 23 U/L (ref 0–37)
Albumin: 4.5 g/dL (ref 3.5–5.2)
BUN: 20 mg/dL (ref 6–23)
CALCIUM: 9.5 mg/dL (ref 8.4–10.5)
CHLORIDE: 102 meq/L (ref 96–112)
CO2: 30 mEq/L (ref 19–32)
Creatinine, Ser: 0.82 mg/dL (ref 0.40–1.20)
GFR: 71.35 mL/min (ref >60.00)
Glucose, Bld: 95 mg/dL (ref 70–99)
Potassium: 4.1 mEq/L (ref 3.5–5.1)
Sodium: 139 mEq/L (ref 135–145)
Total Bilirubin: 0.8 mg/dL (ref 0.2–1.2)
Total Protein: 7.1 g/dL (ref 6.0–8.3)

## 2018-03-18 LAB — POC URINALSYSI DIPSTICK (AUTOMATED)
Bilirubin, UA: NEGATIVE
Blood, UA: NEGATIVE
GLUCOSE UA: NEGATIVE
Ketones, UA: NEGATIVE
Leukocytes, UA: NEGATIVE
Nitrite, UA: NEGATIVE
PROTEIN UA: NEGATIVE
SPEC GRAV UA: 1.01 (ref 1.010–1.025)
UROBILINOGEN UA: 0.2 U/dL
pH, UA: 7 (ref 5.0–8.0)

## 2018-03-18 LAB — LIPID PANEL
Cholesterol: 184 mg/dL (ref 0–200)
HDL: 54.7 mg/dL (ref >39.00)
LDL CALC: 108 mg/dL — AB (ref 0–99)
NonHDL: 129.45
Total CHOL/HDL Ratio: 3
Triglycerides: 105 mg/dL (ref 0.0–149.0)
VLDL: 21 mg/dL (ref 0.0–40.0)

## 2018-03-18 NOTE — Progress Notes (Signed)
Phone: [phone]  Subjective:  Patient presents today for their annual physical. Chief complaint-noted.   See problem oriented charting- ROS- full  review of systems was completed and negative except for: drooling, hearing loss, rash, urinary frequency  The following were reviewed and entered/updated in epic: Past Medical History:  Diagnosis Date  . Anemia   . Hyperlipidemia   . Osteopenia   . Skin cancer    hx of scalp  . Thalassemia    Patient Active Problem List   Diagnosis Date Noted  . Hyperlipidemia 07/04/2007    Priority: Medium  . THALASSEMIA NEC 02/15/2007    Priority: Medium  . Postmenopausal bleeding 10/23/2014    Priority: Low  . Leukopenia 05/20/2014    Priority: Low  . Osteoporosis 08/12/2007    Priority: Low  . SKIN CANCER, HX OF 07/03/2007    Priority: Low  . Viral URI with cough 08/28/2017   Past Surgical History:  Procedure Laterality Date  . APPENDECTOMY    . BUNIONECTOMY     complicated with fractured toe x 2 feet  . CATARACT EXTRACTION     bilateral  . CESAREAN SECTION     x2  . COLONOSCOPY  2003  . DILATION AND CURETTAGE OF UTERUS    . miscarriage     x2  . skin cancer scalp    . TONSILLECTOMY AND ADENOIDECTOMY    . TUBAL LIGATION      Family History  Problem Relation Age of Onset  . Dementia Mother   . Hyperlipidemia Father   . Heart attack Father 62  . Multiple sclerosis Brother        actually radiation poisoning  . Hyperlipidemia Other   . Stroke Other   . Breast cancer Neg Hx     Medications- reviewed and updated Current Outpatient Medications  Medication Sig Dispense Refill  . alendronate (FOSAMAX) 70 MG tablet TAKE ONE TABLET BY MOUTH EVERY 7 DAYS. TAKE WITH A FULL GLASS OF WATER ON AN EMPTY STOMACH. 4 tablet 11  . atorvastatin (LIPITOR) 10 MG tablet TAKE 1 TABLET BY MOUTH DAILY 90 tablet 3  . Calcium Carbonate-Vitamin D (CALCIUM + D PO) Take by mouth.    . Cyanocobalamin (B-12) 1000 MCG SUBL Place under the  tongue.     No current facility-administered medications for this visit.     Allergies-reviewed and updated No Known Allergies  Social History   Social History Narrative   Lives alone. Widowed 1987. 2 children. 4 grandkids. No greatgrandkids.    Lived in [place] since going to [employer] college (livesd away for a few years)      Retired from Audiological scientist. Still doing a lot.       Hobbies: a lot of travel because son lives overseas mostly, daughter in [place].    Same church since 1962.       Advanced directives. HCPOA- both children. DNR    Objective: BP 114/62 (BP Location: Left Arm, Patient Position: Sitting, Cuff Size: Normal)   Pulse 76   Temp 98.2 F (36.8 C) (Oral)   Ht 5\' 3"  (1.6 m)   Wt 160 lb 6.4 oz (72.8 kg)   LMP  (LMP Unknown)   SpO2 96%   BMI 28.41 kg/m  Gen: NAD, resting comfortably HEENT: Mucous membranes are moist. Oropharynx normal Neck: no thyromegaly CV: RRR no murmurs rubs or gallops Lungs: CTAB no crackles, wheeze, rhonchi Abdomen: soft/nontender/nondistended/normal bowel sounds. No rebound or guarding.  Ext: no edema  Skin: warm, dry Neuro: grossly normal, moves all extremities, PERRLA  Assessment/Plan:  77 y.o. female presenting for annual physical.  Health Maintenance counseling: 1. Anticipatory guidance: Patient counseled regarding regular dental exams -q6 months, eye exams - had check within the year, wearing seatbelts.  2. Risk factor reduction:  Advised patient of need for regular exercise and diet rich and fruits and vegetables to reduce risk of heart attack and stroke. Exercise- gym 3 days a week plus does a yoga class. Diet- Weight stable from last year. Had gained weight when friends were in chemo and hasnt lost this yet- asked her to not be too hard on herself- focus on continued exercise and healthy eating- if she loses weight great, if not- at least focus on maintaining Wt Readings from Last 3 Encounters:  03/18/18 160  lb 6.4 oz (72.8 kg)  08/28/17 158 lb (71.7 kg)  01/01/17 161 lb (73 kg)  3. Immunizations/screenings/ancillary studies- discussed shingrix- doing at later date if can find. Needs Td 2022. Already had flu shot Immunization History  Administered Date(s) Administered  . DTaP 01/20/2011  . Hepatitis A 04/27/2006, 11/01/2006  . Hepatitis B 04/27/2006, 05/28/2006, 11/01/2006  . Influenza Split 03/27/2011, 04/03/2012  . Influenza Whole 03/19/2006, 03/17/2009  . Influenza,inj,Quad PF,6+ Mos 04/14/2013  . Influenza-Unspecified 04/02/2014, 04/02/2015, 04/17/2017  . Meningococcal Polysaccharide 04/12/2006  . PPD Test 05/06/2012  . Pneumococcal Conjugate-13 05/20/2014  . Pneumococcal Polysaccharide-23 03/19/2004  . Td 03/19/2006  . Zoster 04/12/2006  4. Cervical cancer screening- no vaginal discharge or bleeding-no pelvic planned.  aged out of age based screening for pap smear 5. Breast cancer screening-  Opts for yearly mammograms still- she reports normal result.  6. Colon cancer screening -  no more checks given age. Stool cards last year were reassuring as were 2015 and 2017- we opted to discontinue screenings 7. Skin cancer screening- sees Dr. [place]- sees his PA.  advised regular sunscreen use. Denies worrisome, changing, or new skin lesions.  8. Osteoporosis screening at 45- doing well on fosamax, calcium, vitamin D- bone density 12/2016 was improved- will plan on repeat in 2020. About 4 years.   Status of chronic or acute concerns   Intermittent issues with urinary frequency- not at present- check UA today. No dysuria.   Rash on left of nose- has been using hydrocortisone 1% about a month ago- seems to come and go. She is going to call her dermatologist back as used this for a full month with no significant improvement.   Had hearing checked- no hearing aids needed.   Also long term drooling issues- not worsening  Hyperlipidemia- mild por control on atorvastatin 10mg  on last check with  LDL 110 and we increased to 20mg  with LDL over 130 last year. For some reason in July 10mg  was sent back in- we will hope for better #s- may need to go back to 20mg  dose if LDL above 130 again  Leukopenia - mild in past and nto noted on last labs (HIV negative 2015)- also has known thalessemia- update CBC   1 year CPE as doing so well  Will check in on how her 17 day [place] trip was next year- leaves later this week!   Lab/Order associations: Preventative health care  Hyperlipidemia, unspecified hyperlipidemia type - Plan: CBC, Comprehensive metabolic panel, Lipid panel, POCT Urinalysis Dipstick (Automated)  Return precautions advised.  Garret Reddish, MD

## 2018-03-18 NOTE — Addendum Note (Signed)
Addended by: Kayren Eaves T on: 03/18/2018 11:54 AM   Modules accepted: Orders

## 2018-03-18 NOTE — Patient Instructions (Addendum)
Keep checking with pharmacies about shingles shot  Please stop by lab before you go

## 2018-08-06 DIAGNOSIS — M25562 Pain in left knee: Secondary | ICD-10-CM | POA: Diagnosis not present

## 2018-09-24 ENCOUNTER — Encounter: Payer: Self-pay | Admitting: Family Medicine

## 2018-09-24 ENCOUNTER — Ambulatory Visit (INDEPENDENT_AMBULATORY_CARE_PROVIDER_SITE_OTHER): Payer: Medicare Other | Admitting: Family Medicine

## 2018-09-24 VITALS — Temp 96.9°F | Ht 63.0 in

## 2018-09-24 DIAGNOSIS — Z20822 Contact with and (suspected) exposure to covid-19: Secondary | ICD-10-CM

## 2018-09-24 DIAGNOSIS — Z20828 Contact with and (suspected) exposure to other viral communicable diseases: Secondary | ICD-10-CM | POA: Diagnosis not present

## 2018-09-24 DIAGNOSIS — R05 Cough: Secondary | ICD-10-CM

## 2018-09-24 DIAGNOSIS — R0981 Nasal congestion: Secondary | ICD-10-CM | POA: Diagnosis not present

## 2018-09-24 DIAGNOSIS — R059 Cough, unspecified: Secondary | ICD-10-CM

## 2018-09-24 NOTE — Patient Instructions (Signed)
Video visit

## 2018-09-24 NOTE — Progress Notes (Addendum)
Phone [phone]   Subjective:  Virtual visit via Video note. Chief complaint:  Chief Complaint  Patient presents with  . Cough   This visit type was conducted due to national recommendations for restrictions regarding the COVID-19 Pandemic (e.g. social distancing).  This format is felt to be most appropriate for this patient at this time balancing risks to patient and risks to population by having him in for in person visit.  All issues noted in this document were discussed and addressed.  No physical exam was performed (except for noted visual exam or audio findings with Telehealth visits).  The patient has consented to conduct a Telehealth visit and understands insurance will be billed.   Our team/I connected with Arnette Norris on 09/24/18 at  4:00 PM EDT by a video enabled telemedicine application ([email or web address]) and verified that I am speaking with the correct person using two identifiers.  Location patient: [facility] Location provider: [facility], office Persons participating in the virtual visit:  patient  Our team/I discussed the limitations of evaluation and management by telemedicine and the availability of in person appointments. In light of current covid-19 pandemic, patient also understands that we are trying to protect them by minimizing in office contact if at all possible.  The patient expressed consent for telemedicine visit and agreed to proceed. Patient understands insurance will be billed.   ROS-no fever, chills, shortness of breath, chest pain.  Past Medical History-  Patient Active Problem List   Diagnosis Date Noted  . Hyperlipidemia 07/04/2007    Priority: Medium  . THALASSEMIA NEC 02/15/2007    Priority: Medium  . Postmenopausal bleeding 10/23/2014    Priority: Low  . Leukopenia 05/20/2014    Priority: Low  . Osteoporosis 08/12/2007    Priority: Low  . SKIN CANCER, HX OF 07/03/2007    Priority: Low    Medications- reviewed and updated Current Outpatient  Medications  Medication Sig Dispense Refill  . alendronate (FOSAMAX) 70 MG tablet TAKE ONE TABLET BY MOUTH EVERY 7 DAYS. TAKE WITH A FULL GLASS OF WATER ON AN EMPTY STOMACH. 4 tablet 11  . atorvastatin (LIPITOR) 10 MG tablet TAKE 1 TABLET BY MOUTH DAILY 90 tablet 3  . Calcium Carbonate-Vitamin D (CALCIUM + D PO) Take by mouth.    . Cyanocobalamin (B-12) 1000 MCG SUBL Place under the tongue.     No current facility-administered medications for this visit.      Objective:  Temp (!) 96.9 F (36.1 C) (Oral)   Ht 5\' 3"  (1.6 m)   LMP  (LMP Unknown)   BMI 28.41 kg/m  Gen: NAD, appears fatigued Points to sinuses as source of discomfort and congestion Lungs: nonlabored, normal respiratory rate  Skin: appears dry, no obvious rash Normal speech    Assessment and Plan   #Sinus congestion, cough S:patient has been sick for 8-9 days. She came off a ship in [place] last Monday march 30th. She found out some of the crew members were diagnosed with covid 32- was in [place]- had to sail 2 extra weeks (4 week cruise)- they were not able to get off in [place]. She got the email once she got home about positive crew members.   3 ladies she traveled with have all been sick. Did got to infirmary for friend who fell. Apparently they were doing extensive cleaning and she thought that may have irritated her.   Her symptoms have been bad cough and sinus pressure. Low appetite. No shortness of breath.  No fever. Coughing up clear sputum. Drinking a lot of water. Has taken some mucinex 600mg  twice a day. Has tried decongestant with little relief.  A/P: I think patient is rather high risk for COVID-19 given she was on a cruise with confirmed positive crew-fortunately at present she has rather mild symptoms and seems to be improving.  I told her that we needed to know immediately if she develops fever and particularly shortness of breath given her age.  Fortunately she has few underlying medical problems.  Her  ship recommended 14 days of quarantine-I added to this the additional qualification but she needs to be symptom-free for 3 days including the cough and sinus congestion.  With all the above being said- I still think sinus infection is a strong possibility as well.  I offered her early antibiotic at day 9 today but she would like to give this a few more days-I will call her back in 2 days.  We discussed possibly Augmentin versus azithromycin-I would lean toward the Augmentin.  Future Appointments  Date Time Provider [place]  09/24/2018  4:00 PM Marin Olp, MD [facility]   Lab/Order associations: Cough  Sinus congestion  Close Exposure to Covid-19 Virus  Return precautions advised.  Garret Reddish, MD  Addendum 10/02/2018- have checked on patient a few times- she seems to be improving daily. Sinuses are doing well. Taste of food is returning.   Garret Reddish   Addendum 10/07/2018. Some nasal congestion but otherwise doing well. No cough/fever/chills. Has been over a week now since coughing. She is going to try flonase or claritin or similar product in case allergies are the issues. We are going to discontinue monitoring/follow up unless she has new or worsening symptoms- she will reach back out  Garret Reddish

## 2018-09-26 ENCOUNTER — Telehealth: Payer: Self-pay | Admitting: *Deleted

## 2018-09-26 MED ORDER — BENZONATATE 100 MG PO CAPS: 100.0000 mg | ORAL_CAPSULE | Freq: Two times a day (BID) | ORAL | 0 refills | Status: DC | PRN

## 2018-09-26 MED ORDER — AZITHROMYCIN 250 MG PO TABS: ORAL_TABLET | ORAL | 0 refills | Status: DC

## 2018-09-26 NOTE — Telephone Encounter (Signed)
Patient is feeling fatigued- she is coughing most of the night.  We will try Tessalon Perles.  She is going to stop Mucinex since cough remains dry.  Roommate on cruise ship now in [place] with "double pneumonia"-very worrisome for COVID-19  I recommended patient have a neighbor pick up medicine and drop it off at her house.  She continues to have sinus congestion-we will try azithromycin as she does not think she can tolerate Augmentin.  She is known to closely monitor for symptoms such as shortness of breath-she knows I have some visits available tomorrow and that we have nurse/doctor on call over the weekend.  She should remain self quarantined

## 2018-09-26 NOTE — Telephone Encounter (Signed)
Copied from [place] [id number]. Topic: General - Other >> Sep 26, 2018  7:42 AM Keene Breath wrote: Reason for CRM: Patient called to give the doctor an update on her condition.  She stated she saw the doctor Tuesday and he asked her to call him today to update him on how she was feeling.  Please advise and call patient back at [phone]

## 2018-09-30 NOTE — Telephone Encounter (Signed)
Patient's cruise roommate is confirmed positive for covid 27 in hospital  Patient states her sinuses are doing much better on azithromycin.  She continues to feel very fatigued and has some night sweats.  She has no shortness of breath.  I told her I would check on her again in 2 days-she knows if she has shortness of breath she needs to go to the hospital immediately.

## 2019-01-10 ENCOUNTER — Other Ambulatory Visit: Payer: Self-pay | Admitting: Family Medicine

## 2019-01-10 DIAGNOSIS — Z1231 Encounter for screening mammogram for malignant neoplasm of breast: Secondary | ICD-10-CM

## 2019-01-22 ENCOUNTER — Other Ambulatory Visit: Payer: Self-pay | Admitting: Family Medicine

## 2019-01-22 NOTE — Telephone Encounter (Signed)
Patient need to schedule a CPE for more refills. 

## 2019-01-28 ENCOUNTER — Telehealth: Payer: Self-pay | Admitting: Family Medicine

## 2019-01-28 NOTE — Telephone Encounter (Signed)
Patient Declined AWV. SF

## 2019-02-09 ENCOUNTER — Other Ambulatory Visit: Payer: Self-pay | Admitting: Family Medicine

## 2019-02-10 NOTE — Telephone Encounter (Signed)
Last CPE 03/08/2018. Please contact pt to schedule CPE. 90 day supply sent to pharmacy.

## 2019-02-10 NOTE — Telephone Encounter (Signed)
Patient has been scheduled for 03/27/19 for her CPE

## 2019-02-14 DIAGNOSIS — L720 Epidermal cyst: Secondary | ICD-10-CM | POA: Diagnosis not present

## 2019-02-14 DIAGNOSIS — L821 Other seborrheic keratosis: Secondary | ICD-10-CM | POA: Diagnosis not present

## 2019-02-14 DIAGNOSIS — D229 Melanocytic nevi, unspecified: Secondary | ICD-10-CM | POA: Diagnosis not present

## 2019-02-20 ENCOUNTER — Other Ambulatory Visit: Payer: Self-pay | Admitting: Family Medicine

## 2019-02-21 ENCOUNTER — Other Ambulatory Visit: Payer: Self-pay | Admitting: Family Medicine

## 2019-02-27 ENCOUNTER — Other Ambulatory Visit: Payer: Self-pay

## 2019-02-27 ENCOUNTER — Ambulatory Visit
Admission: RE | Admit: 2019-02-27 | Discharge: 2019-02-27 | Disposition: A | Discharge status: Home / Self Care | Payer: Medicare Other | Source: Ambulatory Visit | Attending: Family Medicine | Admitting: Family Medicine

## 2019-02-27 DIAGNOSIS — Z1231 Encounter for screening mammogram for malignant neoplasm of breast: Secondary | ICD-10-CM | POA: Diagnosis not present

## 2019-03-26 NOTE — Patient Instructions (Addendum)
Health Maintenance Due  Topic Date Due  . INFLUENZA VACCINE -02/26/2019 01/18/2019   Please check with your pharmacy to see if they have the shingrix vaccine. If they do- please get this immunization and update [place] by phone call or mychart with dates you receive the vaccine  Schedule your bone density test at check out desk. You may also call directly to X-ray at [phone] to schedule an appointment that is convenient for you.  - located [address] across the street from [place] - in the basement - you do need an appointment for the bone density tests.    Thanks for doing labs today!

## 2019-03-26 NOTE — Progress Notes (Signed)
Phone: [phone]   Subjective:  Patient presents today for their annual physical. Chief complaint-noted.   See problem oriented charting- ROS- full  review of systems was completed and negative except for: long term issues with drooling  The following were reviewed and entered/updated in epic: Past Medical History:  Diagnosis Date  . Anemia   . Hyperlipidemia   . Osteopenia   . Skin cancer    hx of scalp  . Thalassemia    Patient Active Problem List   Diagnosis Date Noted  . Hyperlipidemia 07/04/2007    Priority: Medium  . THALASSEMIA NEC 02/15/2007    Priority: Medium  . Postmenopausal bleeding 10/23/2014    Priority: Low  . Leukopenia 05/20/2014    Priority: Low  . Osteoporosis 08/12/2007    Priority: Low  . SKIN CANCER, HX OF 07/03/2007    Priority: Low   Past Surgical History:  Procedure Laterality Date  . APPENDECTOMY    . BUNIONECTOMY     complicated with fractured toe x 2 feet  . CATARACT EXTRACTION     bilateral  . CESAREAN SECTION     x2  . COLONOSCOPY  2003  . DILATION AND CURETTAGE OF UTERUS    . miscarriage     x2  . skin cancer scalp    . TONSILLECTOMY AND ADENOIDECTOMY    . TUBAL LIGATION      Family History  Problem Relation Age of Onset  . Dementia Mother   . Hyperlipidemia Father   . Heart attack Father 76  . Multiple sclerosis Brother        actually radiation poisoning  . Hyperlipidemia Other   . Stroke Other   . Breast cancer Neg Hx     Medications- reviewed and updated Current Outpatient Medications  Medication Sig Dispense Refill  . alendronate (FOSAMAX) 70 MG tablet TAKE 1 TABLET BY MOUTH EVERY 7 DAYS WITH  A  FULL  GLASS  OF  WATER  ON  AN  EMPTY  STOMACH 4 tablet 0  . atorvastatin (LIPITOR) 10 MG tablet Take 1 tablet by mouth daily 30 tablet 0  . Calcium Carbonate-Vitamin D (CALCIUM + D PO) Take by mouth.    . Cyanocobalamin (B-12) 1000 MCG SUBL Place under the tongue.     No current facility-administered  medications for this visit.     Allergies-reviewed and updated No Known Allergies  Social History   Social History Narrative   Lives alone. Widowed 1987. 2 children. 4 grandkids. No greatgrandkids.    Lived in [place] since going to [employer] college (livesd away for a few years)      Retired from Audiological scientist. Still doing a lot.       Hobbies: a lot of travel because son lives overseas mostly, daughter in [place].    Same church since 1962.       Advanced directives. HCPOA- both children. DNR   Objective  Objective:  BP 138/78   Pulse 77   Temp (!) 97.2 F (36.2 C)   Ht 5\' 3"  (1.6 m)   Wt 155 lb 6.4 oz (70.5 kg)   LMP  (LMP Unknown)   SpO2 96%   BMI 27.53 kg/m  Gen: NAD, resting comfortably HEENT: Narrow ear canals-portions of tympanic membrane visible are normal.  PERRLA.  Mask was not removed due to COVID-19 pandemic Neck: no thyromegaly or cervical lymphadenopathy CV: RRR no murmurs rubs or gallops Lungs: CTAB no crackles, wheeze, rhonchi Abdomen: soft/nontender/nondistended/normal  bowel sounds. No rebound or guarding.  Ext: no edema Skin: warm, dry Neuro: grossly normal, moves all extremities,normal gait and speech    Assessment and Plan   78 y.o. female presenting for annual physical. - Health Maintenance counseling: 1. Anticipatory guidance: Patient counseled regarding regular dental exams -q6 months, eye exams - yearly,  avoiding smoking and second hand smoke , limiting alcohol to 1 beverage per day- rare wine .   2. Risk factor reduction:  Advised patient of need for regular exercise and diet rich and fruits and vegetables to reduce risk of heart attack and stroke. Exercise- doing video exercises 5 days a week. Diet-primarily meats, veggies, fruits right now- low carb otherwise. She had gained- weight around time of having covid 19 but has lost weight again. From last year down 5 but form peak down 10 Wt Readings from Last 3 Encounters:  03/27/19  155 lb 6.4 oz (70.5 kg)  03/18/18 160 lb 6.4 oz (72.8 kg)  08/28/17 158 lb (71.7 kg)  3. Immunizations/screenings/ancillary studies-influenza vaccination already received this year.  Discussed could get Shingrix at pharmacy Immunization History  Administered Date(s) Administered  . DTaP 01/20/2011  . Hepatitis A 04/27/2006, 11/01/2006  . Hepatitis B 04/27/2006, 05/28/2006, 11/01/2006  . Influenza Split 03/27/2011, 04/03/2012  . Influenza Whole 03/19/2006, 03/17/2009  . Influenza, High Dose Seasonal PF 02/20/2018, 02/26/2019  . Influenza,inj,Quad PF,6+ Mos 04/14/2013  . Influenza-Unspecified 04/02/2014, 04/02/2015, 04/17/2017  . Meningococcal Polysaccharide 04/12/2006  . PPD Test 05/06/2012  . Pneumococcal Conjugate-13 05/20/2014  . Pneumococcal Polysaccharide-23 03/19/2004  . Td 03/19/2006  . Zoster 04/12/2006   4. Cervical cancer screening-passed age to based screening recommendations.  No vaginal discharge or bleeding-no pelvic plan today  5. Breast cancer screening-mammogram 02/27/2019- she is considering discontinuing screenings at this point or changing to every other year 6. Colon cancer screening - past age to based screening recommendations.  Normal stool cards in 2015 and 2017.  We have opted to discontinue screening. No blood in stool.  7. Skin cancer screening-sees Dr. Onalee Hua office.  8. Birth control/STD check- not sexually active 9. Osteoporosis screening at 63-see osteoporosis discussion below -Never smoker  Status of chronic or acute concerns  Hyperlipidemia- controlled on Lipitor 10mg .  Update lipid level today- unlikely to increase statin unless LDL above 130. couldbe more aggressive given carotid changes but with only mild velocity change do not feel strongly.   - did a life line screen and showd slight change in veliocity in right carotid- she is already on statin as above. They stated no PAD off ABIs and no AAA.   Osteoporosis-patient remains on Fosamax, c2alcium,  vitamin D- 1000 units she thinks.  We will check a vitamin D level.  Last bone density was July 2018.  We opted to repeat bone density at this time  Initial blood pressure elevated but previously controlled-on repeat still high normal- will continue to monitor yearly or sooner if she notes blood pressure high at home- consider getting home cuff especially if trends up  BP Readings from Last 3 Encounters:  03/27/19 138/78  03/18/18 114/62  08/28/17 120/68   UA normal last year- was done for Intermittent issues with urinary frequency- no longer happening- will not repeat  All her life- long term drooling issues- not worsening  Leukopenia - mild in past and nto noted on last labs (HIV negative 2015)- also has known thalessemia- update CBC diff  Recommended follow up: 1 year physical  Lab/Order associations: fasting  ICD-10-CM   1. Preventative health care  Z00.00 Comprehensive metabolic panel    Lipid panel    VITAMIN D 25 Hydroxy (Vit-D Deficiency, Fractures)    DG Bone Density    CBC with Differential/Platelet    CANCELED: POCT Urinalysis Dipstick (Automated)  2. Hyperlipidemia, unspecified hyperlipidemia type  E78.5 Comprehensive metabolic panel    Lipid panel    CBC with Differential/Platelet    CANCELED: POCT Urinalysis Dipstick (Automated)  3. Leukopenia, unspecified type  D72.819   4. Age-related osteoporosis without current pathological fracture  M81.0 VITAMIN D 25 Hydroxy (Vit-D Deficiency, Fractures)    DG Bone Density  5. Other thalassemia ([employer]) Chronic D56.8    Return precautions advised.  Garret Reddish, MD

## 2019-03-27 ENCOUNTER — Other Ambulatory Visit: Payer: Self-pay

## 2019-03-27 ENCOUNTER — Ambulatory Visit (INDEPENDENT_AMBULATORY_CARE_PROVIDER_SITE_OTHER): Payer: Medicare Other | Admitting: Family Medicine

## 2019-03-27 ENCOUNTER — Encounter: Payer: Self-pay | Admitting: Family Medicine

## 2019-03-27 VITALS — BP 138/78 | HR 77 | Temp 97.2°F | Ht 63.0 in | Wt 155.4 lb

## 2019-03-27 DIAGNOSIS — Z0000 Encounter for general adult medical examination without abnormal findings: Secondary | ICD-10-CM

## 2019-03-27 DIAGNOSIS — D72819 Decreased white blood cell count, unspecified: Secondary | ICD-10-CM

## 2019-03-27 DIAGNOSIS — E785 Hyperlipidemia, unspecified: Secondary | ICD-10-CM

## 2019-03-27 DIAGNOSIS — M810 Age-related osteoporosis without current pathological fracture: Secondary | ICD-10-CM

## 2019-03-27 DIAGNOSIS — D568 Other thalassemias: Secondary | ICD-10-CM

## 2019-03-27 LAB — COMPREHENSIVE METABOLIC PANEL
ALT: 18 U/L (ref 0–35)
AST: 21 U/L (ref 0–37)
Albumin: 4.5 g/dL (ref 3.5–5.2)
Alkaline Phosphatase: 56 U/L (ref 39–117)
BUN: 16 mg/dL (ref 6–23)
CO2: 28 mEq/L (ref 19–32)
Calcium: 9.2 mg/dL (ref 8.4–10.5)
Chloride: 105 mEq/L (ref 96–112)
Creatinine, Ser: 0.8 mg/dL (ref 0.40–1.20)
GFR: 68.89 mL/min (ref >60.00)
Glucose, Bld: 90 mg/dL (ref 70–99)
Potassium: 4.4 mEq/L (ref 3.5–5.1)
Sodium: 140 mEq/L (ref 135–145)
Total Bilirubin: 0.6 mg/dL (ref 0.2–1.2)
Total Protein: 6.6 g/dL (ref 6.0–8.3)

## 2019-03-27 LAB — CBC WITH DIFFERENTIAL/PLATELET
Basophils Absolute: 0 10*3/uL (ref 0.0–0.1)
Basophils Relative: 1 % (ref 0.0–3.0)
Eosinophils Absolute: 0.1 10*3/uL (ref 0.0–0.7)
Eosinophils Relative: 1.2 % (ref 0.0–5.0)
HCT: 36.9 % (ref 36.0–46.0)
Hemoglobin: 11.8 g/dL — ABNORMAL LOW (ref 12.0–15.0)
Lymphocytes Relative: 47.6 % — ABNORMAL HIGH (ref 12.0–46.0)
Lymphs Abs: 2.4 10*3/uL (ref 0.7–4.0)
MCHC: 32.1 g/dL (ref 30.0–36.0)
MCV: 67.2 fl — ABNORMAL LOW (ref 78.0–100.0)
Monocytes Absolute: 0.4 10*3/uL (ref 0.1–1.0)
Monocytes Relative: 9 % (ref 3.0–12.0)
Neutro Abs: 2 10*3/uL (ref 1.4–7.7)
Neutrophils Relative %: 41.2 % — ABNORMAL LOW (ref 43.0–77.0)
Platelets: 226 10*3/uL (ref 150.0–400.0)
RBC: 5.48 Mil/uL — ABNORMAL HIGH (ref 3.87–5.11)
RDW: 15.7 % — ABNORMAL HIGH (ref 11.5–15.5)
WBC: 5 10*3/uL (ref 4.0–10.5)

## 2019-03-27 LAB — LIPID PANEL
Cholesterol: 183 mg/dL (ref 0–200)
HDL: 57.9 mg/dL (ref >39.00)
LDL Cholesterol: 112 mg/dL — ABNORMAL HIGH (ref 0–99)
NonHDL: 124.97
Total CHOL/HDL Ratio: 3
Triglycerides: 67 mg/dL (ref 0.0–149.0)
VLDL: 13.4 mg/dL (ref 0.0–40.0)

## 2019-03-27 LAB — VITAMIN D 25 HYDROXY (VIT D DEFICIENCY, FRACTURES): VITD: 41.12 ng/mL (ref 30.00–100.00)

## 2019-03-28 ENCOUNTER — Other Ambulatory Visit: Payer: Self-pay

## 2019-03-28 MED ORDER — ATORVASTATIN CALCIUM 10 MG PO TABS: 10.0000 mg | ORAL_TABLET | Freq: Every day | ORAL | 3 refills | Status: DC

## 2019-03-31 ENCOUNTER — Other Ambulatory Visit: Payer: Self-pay | Admitting: Family Medicine

## 2019-04-02 ENCOUNTER — Other Ambulatory Visit: Payer: Self-pay

## 2019-04-02 ENCOUNTER — Ambulatory Visit (INDEPENDENT_AMBULATORY_CARE_PROVIDER_SITE_OTHER)
Admission: RE | Admit: 2019-04-02 | Discharge: 2019-04-02 | Disposition: A | Discharge status: Home / Self Care | Payer: Medicare Other | Source: Ambulatory Visit | Attending: Family Medicine | Admitting: Family Medicine

## 2019-04-02 DIAGNOSIS — Z0000 Encounter for general adult medical examination without abnormal findings: Secondary | ICD-10-CM

## 2019-04-02 DIAGNOSIS — M810 Age-related osteoporosis without current pathological fracture: Secondary | ICD-10-CM

## 2019-05-09 ENCOUNTER — Other Ambulatory Visit: Payer: Self-pay | Admitting: Family Medicine

## 2019-05-20 ENCOUNTER — Other Ambulatory Visit: Payer: Self-pay

## 2019-05-20 MED ORDER — ALENDRONATE SODIUM 70 MG PO TABS: ORAL_TABLET | ORAL | 0 refills | Status: DC

## 2019-06-25 ENCOUNTER — Encounter: Payer: Self-pay | Admitting: Family Medicine

## 2019-07-06 ENCOUNTER — Other Ambulatory Visit: Payer: Self-pay | Admitting: Family Medicine

## 2019-07-06 ENCOUNTER — Ambulatory Visit: Payer: Medicare Other | Attending: Internal Medicine

## 2019-07-06 DIAGNOSIS — Z23 Encounter for immunization: Secondary | ICD-10-CM

## 2019-07-06 NOTE — Progress Notes (Signed)
   Covid-19 Vaccination Clinic  Name:  Abigail Shah    MRN: [id number] DOB: 1941/06/15  07/06/2019  [name] was observed post Covid-19 immunization for 15 minutes without incidence. She was provided with Vaccine Information Sheet and instruction to access the V-Safe system.   [name] was instructed to call 911 with any severe reactions post vaccine: Marland Kitchen Difficulty breathing  . Swelling of your face and throat  . A fast heartbeat  . A bad rash all over your body  . Dizziness and weakness   Patient was discharged @ 11:20.

## 2019-07-15 IMAGING — MG DIGITAL SCREENING BILATERAL MAMMOGRAM WITH TOMO AND CAD
8 series · 9 of 24 positions shown · non-contrast
Comparison: Previous exam(s).

CLINICAL DATA: Screening.

EXAM:
DIGITAL SCREENING BILATERAL MAMMOGRAM WITH TOMO AND CAD

[L CC synth-2D]
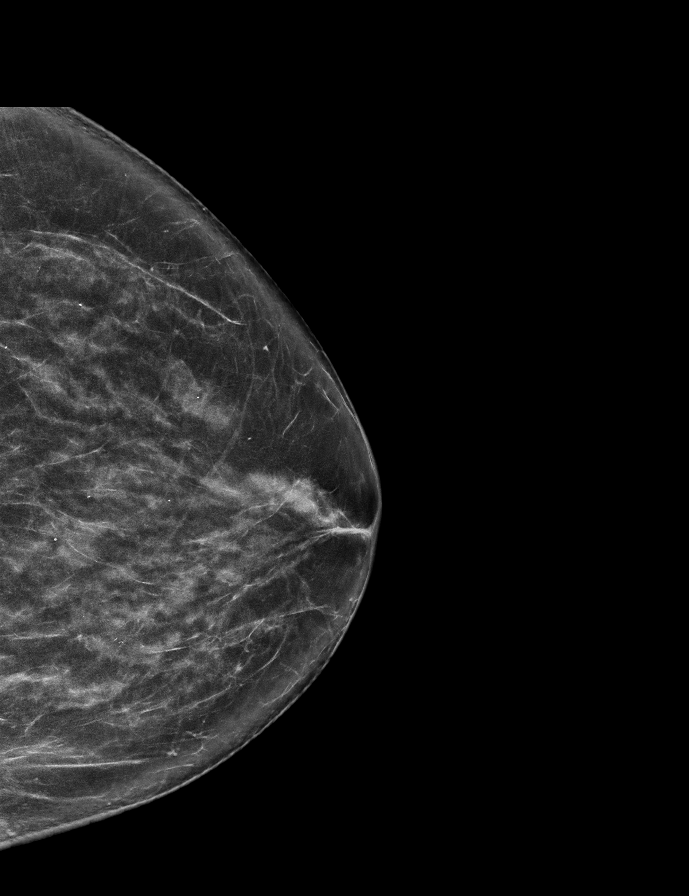

[R MLO synth-2D]
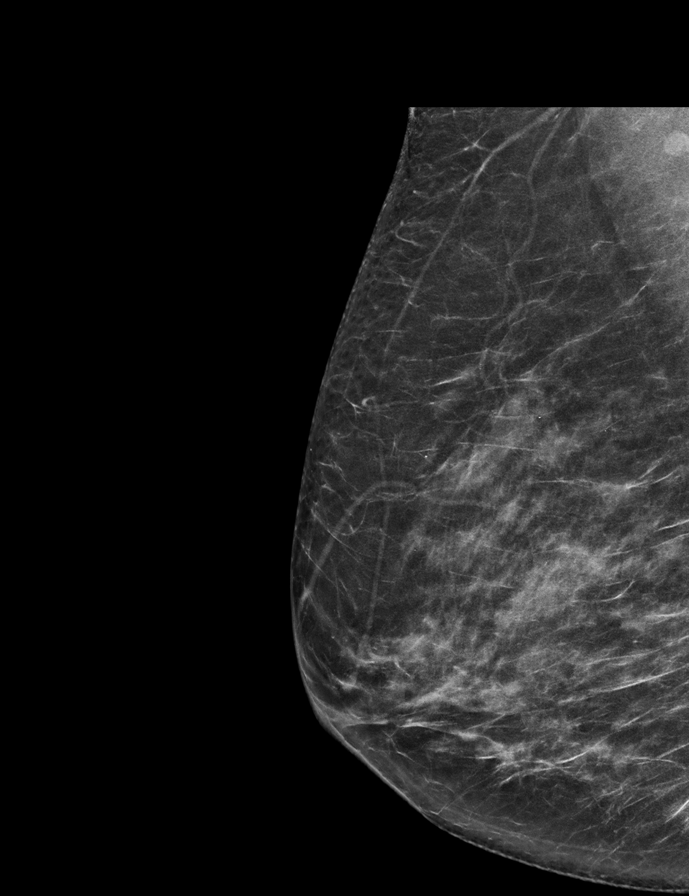

[L MLO synth-2D]
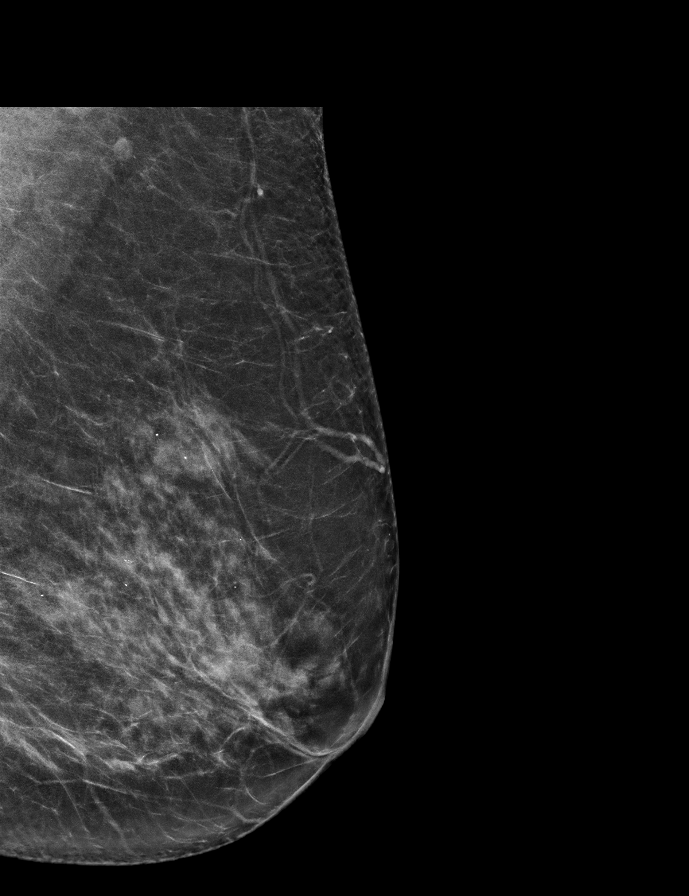

[R CC synth-2D]
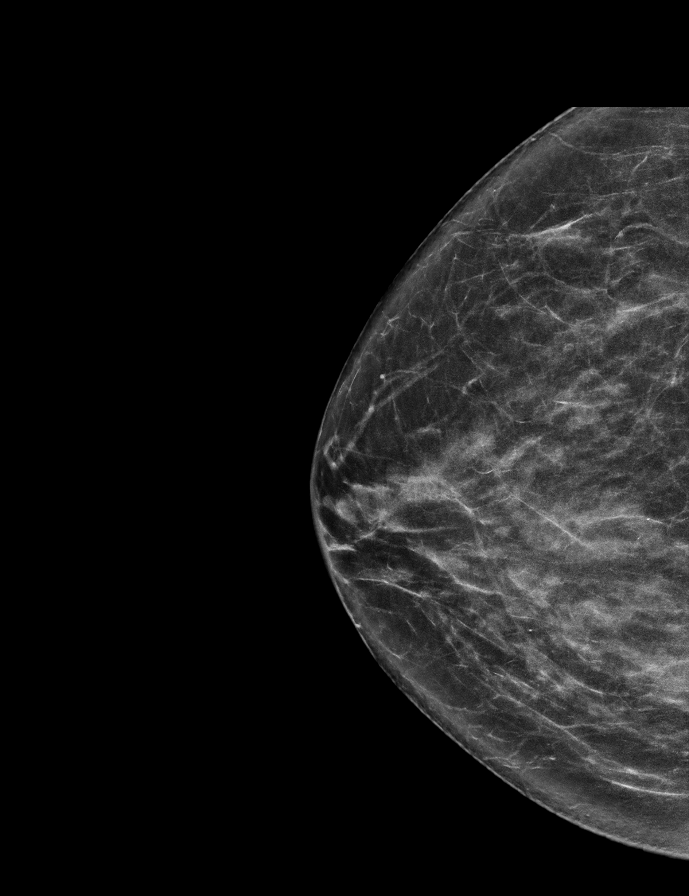

[L MLO tomo · 2 of 71 frames shown]
[frame 23/71]
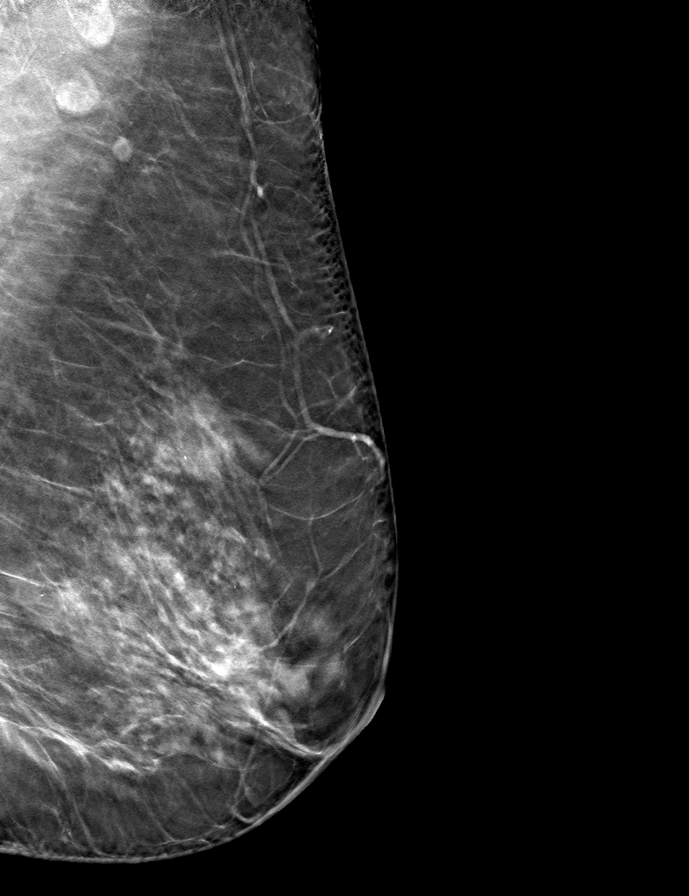
[frame 36/71]
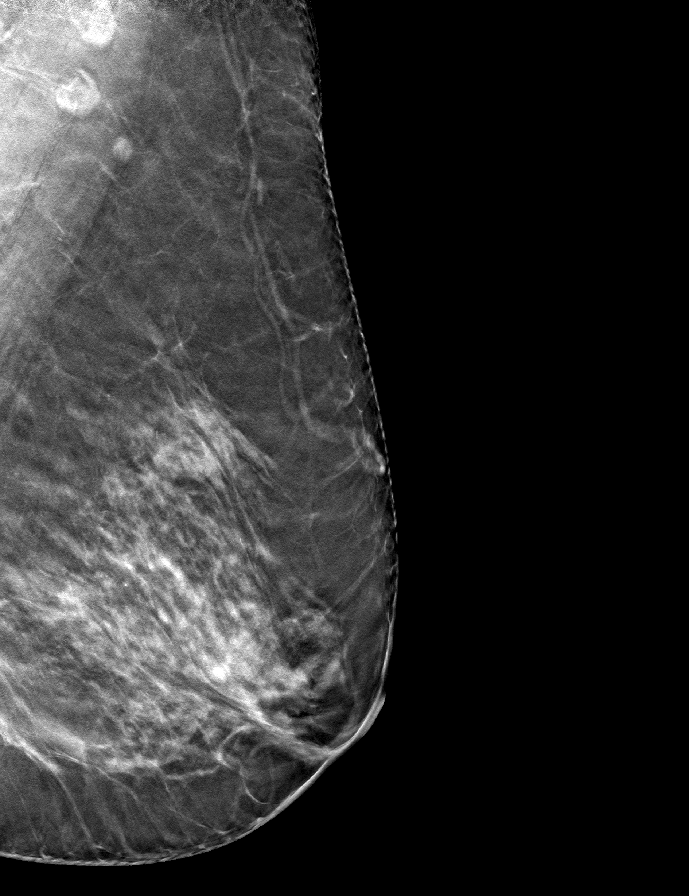

[R MLO tomo · tomo slice 34/67.0]
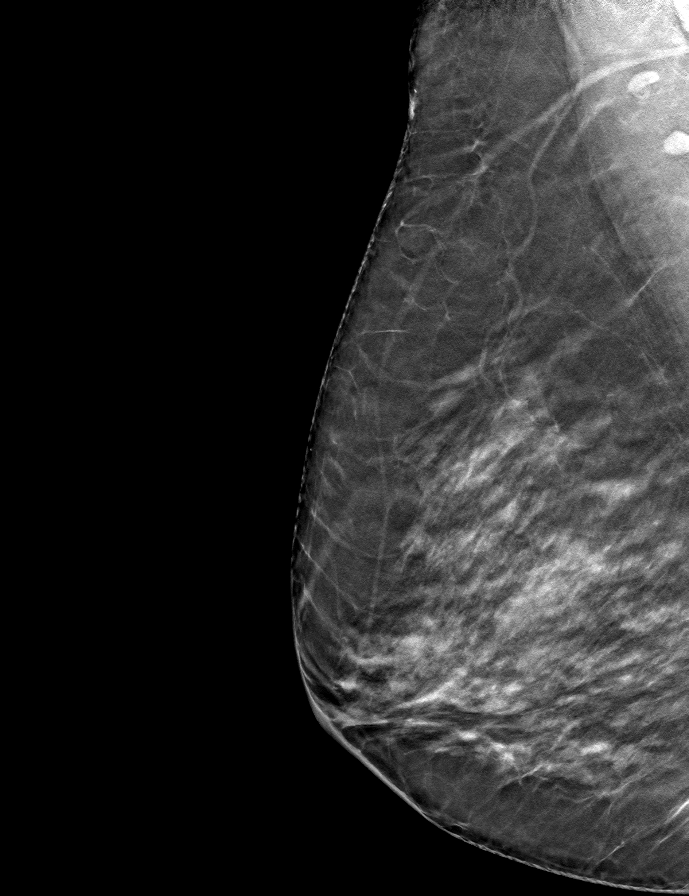

[L CC tomo · tomo slice 33/66.0]
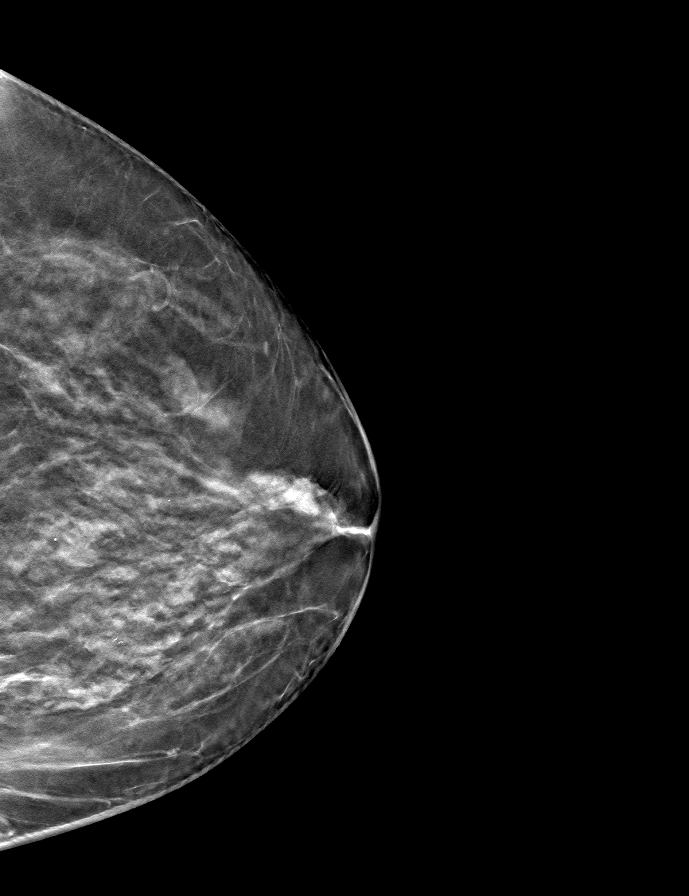

[R CC tomo · tomo slice 33/65.0]
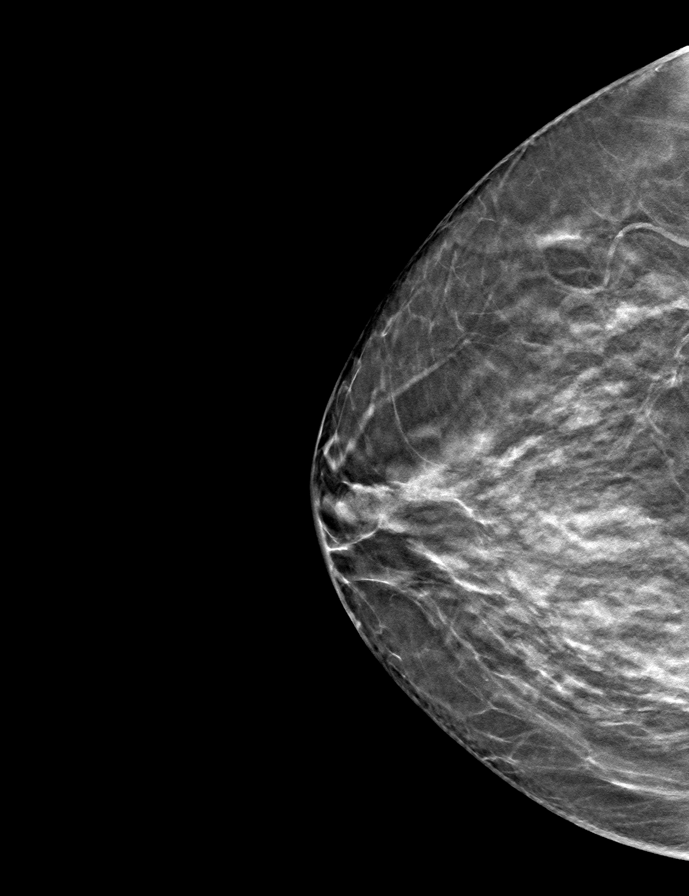

[9 of 24 positions shown; findings below may reference images not displayed]

ACR Breast Density Category c: The breast tissue is heterogeneously
dense, which may obscure small masses.
FINDINGS: There are no findings suspicious for malignancy. Images were
processed with CAD.
IMPRESSION: No mammographic evidence of malignancy. A result letter of this
screening mammogram will be mailed directly to the patient.

RECOMMENDATION:
Screening mammogram in one year. (Code:[id number])

BI-RADS CATEGORY  1: Negative.

## 2019-07-26 ENCOUNTER — Ambulatory Visit: Payer: Medicare Other | Attending: Internal Medicine

## 2019-07-26 DIAGNOSIS — Z23 Encounter for immunization: Secondary | ICD-10-CM | POA: Insufficient documentation

## 2019-07-26 NOTE — Progress Notes (Signed)
   Covid-19 Vaccination Clinic  Name:  Abigail Shah    MRN: [id number] DOB: 1940-09-03  07/26/2019  [name] was observed post Covid-19 immunization for 15 minutes without incidence. She was provided with Vaccine Information Sheet and instruction to access the V-Safe system.   [name] was instructed to call 911 with any severe reactions post vaccine: Marland Kitchen Difficulty breathing  . Swelling of your face and throat  . A fast heartbeat  . A bad rash all over your body  . Dizziness and weakness    Immunizations Administered    Name Date Dose VIS Date Route   Pfizer COVID-19 Vaccine 07/26/2019 11:14 AM 0.3 mL 05/30/2019 Intramuscular   Manufacturer: [place]   Lot: [id number]   [place]: [id number]

## 2019-07-29 ENCOUNTER — Encounter: Payer: Self-pay | Admitting: Family Medicine

## 2019-11-03 ENCOUNTER — Other Ambulatory Visit: Payer: Self-pay

## 2019-11-04 ENCOUNTER — Ambulatory Visit (INDEPENDENT_AMBULATORY_CARE_PROVIDER_SITE_OTHER): Payer: Medicare Other | Admitting: Family Medicine

## 2019-11-04 ENCOUNTER — Encounter: Payer: Self-pay | Admitting: Family Medicine

## 2019-11-04 VITALS — BP 128/64 | HR 81 | Temp 96.7°F | Ht 63.0 in | Wt 155.0 lb

## 2019-11-04 DIAGNOSIS — M79605 Pain in left leg: Secondary | ICD-10-CM | POA: Diagnosis not present

## 2019-11-04 DIAGNOSIS — E785 Hyperlipidemia, unspecified: Secondary | ICD-10-CM | POA: Diagnosis not present

## 2019-11-04 LAB — D-DIMER, QUANTITATIVE: D-Dimer, Quant: 0.24 mcg/mL FEU (ref <0.50)

## 2019-11-04 NOTE — Progress Notes (Signed)
Phone [phone] In person visit   Subjective:   Abigail Shah is a 79 y.o. year old very pleasant female patient who presents for/with See problem oriented charting Chief Complaint  Patient presents with  . Groin Pain   This visit occurred during the SARS-CoV-2 public health emergency.  Safety protocols were in place, including screening questions prior to the visit, additional usage of staff PPE, and extensive cleaning of exam room while observing appropriate contact time as indicated for disinfecting solutions.   Past Medical History-  Patient Active Problem List   Diagnosis Date Noted  . Hyperlipidemia 07/04/2007    Priority: Medium  . THALASSEMIA NEC 02/15/2007    Priority: Medium  . Postmenopausal bleeding 10/23/2014    Priority: Low  . Leukopenia 05/20/2014    Priority: Low  . Osteoporosis 08/12/2007    Priority: Low  . SKIN CANCER, HX OF 07/03/2007    Priority: Low    Medications- reviewed and updated Current Outpatient Medications  Medication Sig Dispense Refill  . alendronate (FOSAMAX) 70 MG tablet TAKE 1 TABLET BY MOUTH ONCE A WEEK ON AN EMPTY STOMACH WITH  A  FULL  GLASS  OF  WATER 4 tablet 5  . atorvastatin (LIPITOR) 10 MG tablet Take 1 tablet (10 mg total) by mouth daily. 90 tablet 3  . Calcium Carbonate-Vitamin D (CALCIUM + D PO) Take by mouth.    . Cyanocobalamin (B-12) 1000 MCG SUBL Place under the tongue.     No current facility-administered medications for this visit.     Objective:  BP 128/64   Pulse 81   Temp (!) 96.7 F (35.9 C) (Temporal)   Ht 5\' 3"  (1.6 m)   Wt 155 lb (70.3 kg)   LMP  (LMP Unknown)   SpO2 93%   BMI 27.46 kg/m  Gen: NAD, resting comfortably CV: RRR no murmurs rubs or gallops Lungs: CTAB no crackles, wheeze, rhonchi Ext: no edema Skin: warm, dry MSK: Good range of motion of the hips with no pain and good flexibility.  Some medial joint line pain on left knee.  Patient is tender in a band from above the knee to the  left upper leg-area of tenderness not more than 3 cm or so    Assessment and Plan   # Left upper leg pain S: Started 1 month ago. Sharp pains that comes and go- bothers her 2-3 days a week and may last all week. Hurts from inner upper leg to just above the knee. Mild pain. Yesterday it was a stronger pain but today had resolved until she pushed on the area.  Gets better with exercise. No falls or injuries  She is most worried about blood cot in the leg in relation to covid vaccine- she had pfizer vaccine and completed 07/26/2019 2nd shot. No swelling in calf or pain in the calf. No fever/chills/redness  Has not tried heat/ice/or any medication for this.  A/P: 79 year old female with left upper inner thigh pain radiating down to the knee.  I was unable to reproduce the pain with motion of the leg but she did have pain with direct palpation.   -She is most worried about a blood clot so we opted to get a D-dimer test to rule this out-I do not strongly suspect the blood clot -If the D-dimer is not elevated which would mean there is no clot: 1.  She is going to use heat on this area 20 minutes 3 times a day 2.  I  want her to try off the atorvastatin for 1 to 2 weeks 3.  She is going to try Tylenol arthritis twice a day separated by 8 hours 4.  She is going to take it easy with activity and exercise 5.  If not better in 1 to 2 weeks she agrees to let me know and I will do a referral to sports medicine  Pain is with palpation of musculature- I doubt this is bony abnormality so no x-ray performed. No rash to suggest shingles plus time course doesn't line up.   #hyperlipidemia S: Medication: atorvastatin 10mg   Lab Results  Component Value Date   CHOL 183 03/27/2019   HDL 57.90 03/27/2019   LDLCALC 112 (H) 03/27/2019   LDLDIRECT 179.8 05/06/2012   TRIG 67.0 03/27/2019   CHOLHDL 3 03/27/2019   A/P: Mild poor control of cholesterol-we have not feel strongly about increasing the dose of her medicine  for primary prevention at her age.  I also am going to try her off statin for 2 weeks in case contributing to leg myalgia (though in my experience usually is not so isolated)  Recommended follow up: Return for as needed for new, worsening, persistent symptoms.  Lab/Order associations:   ICD-10-CM   1. Left leg pain  M79.605 D-Dimer, Quantitative  2. Hyperlipidemia, unspecified hyperlipidemia type  E78.5    Return precautions advised.  Garret Reddish, MD

## 2019-11-04 NOTE — Patient Instructions (Addendum)
79 year old female with left upper inner thigh pain radiating down to the knee.  I was unable to reproduce the pain with motion of the leg but she did have pain with direct palpation.   -She is most worried about a blood clot so we opted to get a D-dimer test to rule this out-I do not strongly suspect the blood clot -If the D-dimer is not elevated which would mean there is no clot: 1.  She is going to use heat on this area 20 minutes 3 times a day 2.  I want her to try off the atorvastatin for 1 to 2 weeks 3.  She is going to try Tylenol arthritis twice a day separated by 8 hours 4.  She is going to take it easy with activity and exercise 5.  If not better in 1 to 2 weeks she agrees to let me know and I will do a referral to sports medicine   Recommended follow up: Return for as needed for new, worsening, persistent symptoms.

## 2019-12-29 ENCOUNTER — Other Ambulatory Visit: Payer: Self-pay

## 2019-12-29 ENCOUNTER — Telehealth: Payer: Self-pay | Admitting: Family Medicine

## 2019-12-29 DIAGNOSIS — M24549 Contracture, unspecified hand: Secondary | ICD-10-CM

## 2019-12-29 NOTE — Telephone Encounter (Signed)
Do you want sent to sports med or hand specialist?

## 2019-12-29 NOTE — Telephone Encounter (Signed)
I apologize- I had notes about her leg last visit- can she remind me what the hand issue is. I generally start with sports medicine so see if that's what I had mentioned (I may have specifically mentioned hand surgeon but I just dont see it in my notes to reference)

## 2019-12-29 NOTE — Telephone Encounter (Signed)
Patient called in and stated she would like a referral sent for a hand specialist she said she had discussed this with Dr. Yong Channel last time she was in the office.

## 2019-12-30 ENCOUNTER — Encounter: Payer: Self-pay | Admitting: Physician Assistant

## 2019-12-30 ENCOUNTER — Ambulatory Visit: Payer: Medicare Other | Admitting: Physician Assistant

## 2019-12-30 ENCOUNTER — Other Ambulatory Visit: Payer: Self-pay

## 2019-12-30 DIAGNOSIS — D485 Neoplasm of uncertain behavior of skin: Secondary | ICD-10-CM | POA: Diagnosis not present

## 2019-12-30 DIAGNOSIS — Z86018 Personal history of other benign neoplasm: Secondary | ICD-10-CM | POA: Diagnosis not present

## 2019-12-30 DIAGNOSIS — Z85828 Personal history of other malignant neoplasm of skin: Secondary | ICD-10-CM | POA: Diagnosis not present

## 2019-12-30 DIAGNOSIS — D489 Neoplasm of uncertain behavior, unspecified: Secondary | ICD-10-CM

## 2019-12-30 DIAGNOSIS — Z1283 Encounter for screening for malignant neoplasm of skin: Secondary | ICD-10-CM

## 2019-12-30 DIAGNOSIS — L720 Epidermal cyst: Secondary | ICD-10-CM | POA: Diagnosis not present

## 2019-12-30 NOTE — Progress Notes (Addendum)
   Follow-Up Visit   Subjective  Abigail Shah is a 79 y.o. female who presents for the following: Annual Exam (no new concerns). Crusty spots on back increasing in size and number. Patients children are worried about it.   The following portions of the chart were reviewed this encounter and updated as appropriate: Tobacco  Allergies  Meds  Problems  Med Hx  Surg Hx  Fam Hx      Objective  Well appearing patient in no apparent distress; mood and affect are within normal limits.  A full examination was performed including scalp, head, eyes, ears, nose, lips, neck, chest, axillae, abdomen, back, buttocks, bilateral upper extremities, bilateral lower extremities, hands, feet, fingers, toes, fingernails, and toenails. All findings within normal limits unless otherwise noted below.  Objective  Mid Back: Nodule with black comedome     Assessment & Plan  History of basal cell carcinoma (BCC) Head - Anterior (Face)  History of dysplastic nevus Neck - Anterior  Screening exam for skin cancer Head - Anterior (Face)  Neoplasm of uncertain behavior Mid Back  Epidermal / dermal shaving  Lesion diameter (cm):  2 Informed consent: discussed and consent obtained   Timeout: patient name, date of birth, surgical site, and procedure verified   Procedure prep:  Patient was prepped and draped in usual sterile fashion Prep type:  Chlorhexidine Anesthesia: the lesion was anesthetized in a standard fashion   Anesthetic:  1% lidocaine w/ epinephrine 1-100,000 local infiltration Instrument used: DermaBlade   Hemostasis achieved with: aluminum chloride   Outcome: patient tolerated procedure well   Post-procedure details: sterile dressing applied and wound care instructions given   Dressing type: petrolatum gauze, petrolatum and bandage    Specimen 1 - Surgical pathology Differential Diagnosis:epidermal cyst Check Margins: No   I, Zeya Balles, PA-C, have reviewed all  documentation's for this visit.  The documentation on 01/23/20 for the exam, diagnosis, procedures and orders are all accurate and complete.

## 2019-12-30 NOTE — Patient Instructions (Signed)

## 2019-12-31 ENCOUNTER — Encounter: Payer: Self-pay | Admitting: Family Medicine

## 2020-01-01 NOTE — Addendum Note (Signed)
Addended by: Marian Sorrow on: 01/01/2020 03:35 PM   Modules accepted: Orders

## 2020-01-01 NOTE — Telephone Encounter (Signed)
Spoke to pt told her Dr. Yong Channel apologizes he does not remember about your hand. 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 Told her I will let Dr. Yong Channel know and call her back whether we are sending her to Sports Medicine or Hand Specialist. Pt verbalized understanding.

## 2020-01-01 NOTE — Telephone Encounter (Signed)
Spoke to pt told her discussed with Dr. Yong Channel and he said to send you to Sports Medicine. Told pt I will place the referral and someone will contact you to schedule an appt. Pt verbalized understanding. Order for referral put in [place].

## 2020-01-07 ENCOUNTER — Ambulatory Visit: Payer: Medicare Other | Admitting: Family Medicine

## 2020-01-07 ENCOUNTER — Encounter: Payer: Self-pay | Admitting: Family Medicine

## 2020-01-07 ENCOUNTER — Other Ambulatory Visit: Payer: Self-pay

## 2020-01-07 ENCOUNTER — Ambulatory Visit: Payer: Self-pay

## 2020-01-07 VITALS — BP 124/78 | HR 74 | Ht 63.0 in | Wt 158.6 lb

## 2020-01-07 DIAGNOSIS — M720 Palmar fascial fibromatosis [Dupuytren]: Secondary | ICD-10-CM | POA: Diagnosis not present

## 2020-01-07 DIAGNOSIS — M79641 Pain in right hand: Secondary | ICD-10-CM | POA: Diagnosis not present

## 2020-01-07 NOTE — Patient Instructions (Signed)
Thank you for coming in today. Plan for hand PT.  I will get an opinion with Dr Amedeo Plenty who is a Copy.  Recheck with me if needed.

## 2020-01-07 NOTE — Progress Notes (Signed)
    Subjective:    CC: R hand pain  I, Molly Weber, LAT, ATC, am serving as scribe for Dr. Lynne Leader.  HPI: Pt is a 79 y/o female presenting w/ c/o R hand stiffness/decreased ROM.  She is a little bit of symptoms in her left hand as well.  She plays the organ and is worried about her stiffness worsening limiting her ability to play a musical instrument.  She denies significant pain.  R hand swelling: No Aggravating factors: playing piano Treatments tried: Nothing  Pertinent review of Systems: No fevers or chills  Relevant historical information: Osteoporosis   Objective:    Vitals:   01/07/20 1454  BP: 124/78  Pulse: 74  SpO2: 96%   General: Well Developed, well nourished, and in no acute distress.   MSK: Right hand palpable cord fourth digit palmar aspect with very minimal contracture.  No nodules palpated. Left hand palpable cord fourth digit palmar aspect with very minimal contracture no parotid nodules palpated.     Impression and Recommendations:    Assessment and Plan: 79 y.o. female with early Dupuytren's contracture bilaterally right worse than left.  At this point should treat with hand therapy.  Also will have consultation with hand surgery to discuss potential surgical or percutaneous tenotomy or even collagenase injection.  At this point she does not have a significant nodule that would be amenable to steroid injection.  We will avoid steroid injections for now.  Recheck back with me.  [facility]er to [facility] for hand therapy and Dr. Amedeo Plenty for hand surgery evaluation..   Orders Placed This Encounter  Procedures  . Ambulatory referral to Physical Therapy    Referral Priority:   Routine    Referral Type:   Physical Medicine    Referral Reason:   Specialty Services Required    Requested Specialty:   Physical Therapy    Number of Visits Requested:   1  . Ambulatory referral to Orthopedic Surgery    Referral Priority:   Routine    Referral Type:    Surgical    Referral Reason:   Specialty Services Required    Referred to Provider:   Roseanne Kaufman, MD    Requested Specialty:   Orthopedic Surgery    Number of Visits Requested:   1   No orders of the defined types were placed in this encounter.   Discussed warning signs or symptoms. Please see discharge instructions. Patient expresses understanding.   The above documentation has been reviewed and is accurate and complete Lynne Leader, M.D.

## 2020-01-13 ENCOUNTER — Ambulatory Visit (INDEPENDENT_AMBULATORY_CARE_PROVIDER_SITE_OTHER): Payer: Medicare Other

## 2020-01-13 ENCOUNTER — Other Ambulatory Visit: Payer: Self-pay

## 2020-01-13 DIAGNOSIS — L7211 Pilar cyst: Secondary | ICD-10-CM

## 2020-01-14 DIAGNOSIS — M25641 Stiffness of right hand, not elsewhere classified: Secondary | ICD-10-CM | POA: Diagnosis not present

## 2020-01-23 ENCOUNTER — Encounter: Payer: Self-pay | Admitting: Physician Assistant

## 2020-01-28 ENCOUNTER — Other Ambulatory Visit: Payer: Self-pay | Admitting: Family Medicine

## 2020-02-17 NOTE — Progress Notes (Signed)
No signs or symptoms of infection

## 2020-03-22 ENCOUNTER — Other Ambulatory Visit: Payer: Self-pay | Admitting: Family Medicine
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
## 2020-04-15 NOTE — Patient Instructions (Addendum)
Please stop by lab before you go If you have mychart- we will send your results within 3 business days of [place] receiving them.  If you do not have mychart- we will call you about results within 5 business days of [place] receiving them.  *please note we are currently using [facility] which has a longer processing time than [facility] typically so labs may not come back as quickly as in the past *please also note that you will see labs on mychart as soon as they post. I will later go in and write notes on them- will say "notes from Dr. Yong Channel"  Health Maintenance Due  Topic Date Due  . INFLUENZA VACCINE In office flu shot today high dose 01/18/2020   Wait perhaps 2 weeks then-  Please check with your pharmacy to see if they have the shingrix vaccine. If they do- please get this immunization and update [place] by phone call or mychart with dates you receive the vaccine  continue calcium and vitamin D but since she has taken fosamax for 5 years opted to stop fosamax and see how bone density does next year- likely stay off medicine for at least 5 years unless significant worsening

## 2020-04-16 ENCOUNTER — Other Ambulatory Visit: Payer: Self-pay | Admitting: *Deleted

## 2020-04-16 ENCOUNTER — Encounter: Payer: Self-pay | Admitting: Family Medicine

## 2020-04-16 ENCOUNTER — Other Ambulatory Visit: Payer: Self-pay

## 2020-04-16 ENCOUNTER — Ambulatory Visit (INDEPENDENT_AMBULATORY_CARE_PROVIDER_SITE_OTHER): Payer: Medicare Other | Admitting: Family Medicine

## 2020-04-16 VITALS — BP 118/74 | HR 77 | Temp 98.7°F | Resp 18 | Ht 63.0 in | Wt 159.6 lb

## 2020-04-16 DIAGNOSIS — Z23 Encounter for immunization: Secondary | ICD-10-CM

## 2020-04-16 DIAGNOSIS — D568 Other thalassemias: Secondary | ICD-10-CM | POA: Diagnosis not present

## 2020-04-16 DIAGNOSIS — M810 Age-related osteoporosis without current pathological fracture: Secondary | ICD-10-CM

## 2020-04-16 DIAGNOSIS — E785 Hyperlipidemia, unspecified: Secondary | ICD-10-CM

## 2020-04-16 DIAGNOSIS — Z0000 Encounter for general adult medical examination without abnormal findings: Secondary | ICD-10-CM

## 2020-04-17 ENCOUNTER — Other Ambulatory Visit: Payer: Self-pay | Admitting: Family Medicine

## 2020-04-17 LAB — COMPLETE METABOLIC PANEL WITH GFR
AG Ratio: 2.1 (calc) (ref 1.0–2.5)
ALT: 14 U/L (ref 6–29)
AST: 20 U/L (ref 10–35)
Albumin: 4.7 g/dL (ref 3.6–5.1)
Alkaline phosphatase (APISO): 54 U/L (ref 37–153)
BUN: 17 mg/dL (ref 7–25)
CO2: 27 mmol/L (ref 20–32)
Calcium: 9.6 mg/dL (ref 8.6–10.4)
Chloride: 104 mmol/L (ref 98–110)
Creat: 0.84 mg/dL (ref 0.60–0.88)
GFR, Est African American: 76 mL/min/{1.73_m2} (ref >=60)
GFR, Est Non African American: 65 mL/min/{1.73_m2} (ref >=60)
Globulin: 2.2 g/dL (calc) (ref 1.9–3.7)
Glucose, Bld: 97 mg/dL (ref 65–99)
Potassium: 4.7 mmol/L (ref 3.5–5.3)
Sodium: 140 mmol/L (ref 135–146)
Total Bilirubin: 0.8 mg/dL (ref 0.2–1.2)
Total Protein: 6.9 g/dL (ref 6.1–8.1)

## 2020-04-17 LAB — CBC WITH DIFFERENTIAL/PLATELET
Absolute Monocytes: 397 cells/uL (ref 200–950)
Basophils Absolute: 49 cells/uL (ref 0–200)
Basophils Relative: 1 %
Eosinophils Absolute: 69 cells/uL (ref 15–500)
Eosinophils Relative: 1.4 %
HCT: 37.1 % (ref 35.0–45.0)
Hemoglobin: 11.8 g/dL (ref 11.7–15.5)
Lymphs Abs: 2396 cells/uL (ref 850–3900)
MCH: 21.9 pg — ABNORMAL LOW (ref 27.0–33.0)
MCHC: 31.8 g/dL — ABNORMAL LOW (ref 32.0–36.0)
MCV: 68.7 fL — ABNORMAL LOW (ref 80.0–100.0)
MPV: 10.7 fL (ref 7.5–12.5)
Monocytes Relative: 8.1 %
Neutro Abs: 1989 cells/uL (ref 1500–7800)
Neutrophils Relative %: 40.6 %
Platelets: 246 10*3/uL (ref 140–400)
RBC: 5.4 10*6/uL — ABNORMAL HIGH (ref 3.80–5.10)
RDW: 15.4 % — ABNORMAL HIGH (ref 11.0–15.0)
Total Lymphocyte: 48.9 %
WBC: 4.9 10*3/uL (ref 3.8–10.8)

## 2020-04-17 LAB — LIPID PANEL
Cholesterol: 305 mg/dL — ABNORMAL HIGH (ref <200)
HDL: 57 mg/dL (ref >=50)
LDL Cholesterol (Calc): 215 mg/dL (calc) — ABNORMAL HIGH
Non-HDL Cholesterol (Calc): 248 mg/dL (calc) — ABNORMAL HIGH (ref <130)
Total CHOL/HDL Ratio: 5.4 (calc) — ABNORMAL HIGH (ref <5.0)
Triglycerides: 161 mg/dL — ABNORMAL HIGH (ref <150)

## 2020-04-17 LAB — VITAMIN D 25 HYDROXY (VIT D DEFICIENCY, FRACTURES): Vit D, 25-Hydroxy: 26 ng/mL — ABNORMAL LOW (ref 30–100)

## 2020-04-17 MED ORDER — VITAMIN D (ERGOCALCIFEROL) 1.25 MG (50000 UNIT) PO CAPS: 50000.0000 [IU] | ORAL_CAPSULE | ORAL | 0 refills | Status: DC

## 2020-04-19 ENCOUNTER — Telehealth: Payer: Self-pay

## 2020-04-19 NOTE — Telephone Encounter (Signed)
See below

## 2020-04-19 NOTE — Telephone Encounter (Signed)
Patient is calling in stating she was here last week for a physical, and states she forgot to mention to Dr.Hunter that she take 2 tablets of 500 mg of Calcium and taking D3. Wants to know if she needs to take the vitamin D that was sent in or if she can continue with her D3

## 2020-04-20 NOTE — Telephone Encounter (Signed)
Called and lm for pt tcb. 

## 2020-04-20 NOTE — Telephone Encounter (Signed)
If the calcium and vitamin D are separate pills-have her take the calcium. Have her hold her regular vitamin d in favor of high dose- once she finishes high dose can resume her regular dose vitamin D

## 2020-04-21 ENCOUNTER — Other Ambulatory Visit: Payer: Self-pay

## 2020-04-21 MED ORDER — ATORVASTATIN CALCIUM 10 MG PO TABS: 10.0000 mg | ORAL_TABLET | Freq: Every day | ORAL | 3 refills | Status: DC

## 2020-04-21 NOTE — Telephone Encounter (Signed)
Spoke to Abigail Shah and relayed Dr. Ronney Lion note. Abigail Shah has a chew that is a combination of her calcium and vitamin D. Abigail Shah is going to find a tablet that is just calcium and take that with the Higher dose of the vitamin d and stop the lower dose of Vitamin D. She verbalized understanding that once she finishes the higher dose of vitamin D, to continue the lower dose of Vitamin D.

## 2020-04-21 NOTE — Telephone Encounter (Signed)
Noted! Thank you

## 2020-04-30 ENCOUNTER — Other Ambulatory Visit: Payer: Self-pay

## 2020-08-02 DIAGNOSIS — M2042 Other hammer toe(s) (acquired), left foot: Secondary | ICD-10-CM | POA: Diagnosis not present

## 2020-08-02 DIAGNOSIS — M2030 Hallux varus (acquired), unspecified foot: Secondary | ICD-10-CM | POA: Insufficient documentation

## 2020-08-02 DIAGNOSIS — M79672 Pain in left foot: Secondary | ICD-10-CM | POA: Diagnosis not present

## 2020-08-02 DIAGNOSIS — M2031 Hallux varus (acquired), right foot: Secondary | ICD-10-CM | POA: Diagnosis not present

## 2020-08-02 DIAGNOSIS — M2040 Other hammer toe(s) (acquired), unspecified foot: Secondary | ICD-10-CM | POA: Insufficient documentation

## 2020-08-02 DIAGNOSIS — M2032 Hallux varus (acquired), left foot: Secondary | ICD-10-CM | POA: Diagnosis not present

## 2020-09-10 ENCOUNTER — Ambulatory Visit: Payer: Medicare Other

## 2020-09-10 DIAGNOSIS — Z0000 Encounter for general adult medical examination without abnormal findings: Secondary | ICD-10-CM

## 2020-09-29 DIAGNOSIS — D3132 Benign neoplasm of left choroid: Secondary | ICD-10-CM | POA: Diagnosis not present

## 2020-11-03 ENCOUNTER — Other Ambulatory Visit: Payer: Self-pay

## 2020-11-03 ENCOUNTER — Ambulatory Visit: Payer: Medicare Other | Admitting: Physician Assistant

## 2020-11-03 ENCOUNTER — Encounter: Payer: Self-pay | Admitting: Physician Assistant

## 2020-11-03 DIAGNOSIS — L814 Other melanin hyperpigmentation: Secondary | ICD-10-CM

## 2020-11-03 DIAGNOSIS — D1800 Hemangioma unspecified site: Secondary | ICD-10-CM | POA: Diagnosis not present

## 2020-11-03 DIAGNOSIS — L578 Other skin changes due to chronic exposure to nonionizing radiation: Secondary | ICD-10-CM

## 2020-11-03 DIAGNOSIS — I781 Nevus, non-neoplastic: Secondary | ICD-10-CM

## 2020-11-03 DIAGNOSIS — Z1283 Encounter for screening for malignant neoplasm of skin: Secondary | ICD-10-CM | POA: Diagnosis not present

## 2020-11-03 DIAGNOSIS — L821 Other seborrheic keratosis: Secondary | ICD-10-CM

## 2020-11-03 NOTE — Progress Notes (Signed)
   Follow-Up Visit   Subjective  Abigail Shah is a 80 y.o. female who presents for the following: Annual Exam (Here for annual skin exam. Concerns left eyebrow. Has been there awhile. No bleeding. Also patient has red dots that come around her nose area. She has been using cortisone at night. H/o of bcc and slight to moderate atypical moles).   The following portions of the chart were reviewed this encounter and updated as appropriate:  Tobacco  Allergies  Meds  Problems  Med Hx  Surg Hx  Fam Hx      Objective  Well appearing patient in no apparent distress; mood and affect are within normal limits.  All skin waist up examined.  Objective  both cheeks: Small pink vessels that blanch   Assessment & Plan  Telangiectasia both cheeks  Discontinue hydrocortisone.  Lentigines - Scattered tan macules - Discussed due to sun exposure - Benign, observe - Call for any changes  Seborrheic Keratoses- back - Stuck-on, waxy, tan-brown papules and plaques  - Discussed benign etiology and prognosis. - Observe - Call for any changes  Hemangiomas - Red papules - Discussed benign nature - Observe - Call for any changes  Actinic Damage - diffuse scaly erythematous macules with underlying dyspigmentation - Recommend daily broad spectrum sunscreen SPF 30+ to sun-exposed areas, reapply every 2 hours as needed.  - Call for new or changing lesions.  Skin cancer screening performed today.    I, Pax Reasoner, PA-C, have reviewed all documentation's for this visit.  The documentation on 11/03/20 for the exam, diagnosis, procedures and orders are all accurate and complete.

## 2020-12-28 ENCOUNTER — Telehealth: Payer: Self-pay

## 2020-12-28 ENCOUNTER — Encounter: Payer: Self-pay | Admitting: Family Medicine

## 2020-12-28 ENCOUNTER — Telehealth (INDEPENDENT_AMBULATORY_CARE_PROVIDER_SITE_OTHER): Payer: Medicare Other | Admitting: Family Medicine

## 2020-12-28 VITALS — Wt 155.0 lb

## 2020-12-28 DIAGNOSIS — R42 Dizziness and giddiness: Secondary | ICD-10-CM | POA: Diagnosis not present

## 2020-12-28 NOTE — Patient Instructions (Signed)
Please see the link below for more information and a treatment for vertigo.  [email or web address]  Please do not drive with vertigo.  I hope you are feeling better soon!  Seek in person care promptly if your symptoms worsen, new concerns arise or if symptoms do not resole with treatment today.  It was nice to meet you today. I help [facility] out with telemedicine visits on Tuesdays and Thursdays and am available for visits on those days. If you have any concerns or questions following this visit please schedule a follow up visit with your Primary Care doctor or seek care at a local urgent care clinic to avoid delays in care.

## 2020-12-28 NOTE — Progress Notes (Signed)
Virtual Visit via Video Note  I connected with Abigail Shah  on 12/28/20 at  3:00 PM EDT by a video enabled telemedicine application and verified that I am speaking with the correct person using two identifiers.  Location patient: home, [place] Location provider:work or home office Persons participating in the virtual visit: patient, provider  I discussed the limitations of evaluation and management by telemedicine and the availability of in person appointments. The patient expressed understanding and agreed to proceed.   HPI:  Acute telemedicine visit for dizziness: -Onset: last night -Symptoms include: brief spells of dizziness when she would turn to the L get up - now improving this afternoon -worked out yesterday and felt fine -she had vertigo like this once a long time ago that resolved on its own -Denies: fevers, CP, SOB, HA, palpitations, vision changes, weakness, numbness, comiting -Pertinent past medical history: see below -Pertinent medication allergies:No Known Allergies  ROS: See pertinent positives and negatives per HPI.  Past Medical History:  Diagnosis Date   Anemia    Atypical nevus 05/21/2003   Right Fifth Toe-Slight   Atypical nevus 11/27/2012   Right Hip-Moderate   Atypical nevus 12/27/2016   Left Neck-Moderate(widershave)   Atypical nevus x 2 10/21/2001   Left Lower Back-Slight to Moderate and Right Post Upper Arm- Slight   BCC (basal cell carcinoma of skin) 10/21/2001   Left Side Scalp   BCC (basal cell carcinoma of skin) 11/23/2009   Left Forehead(curet and cautery)   BCC (basal cell carcinoma of skin) 08/01/2011   Left Upper Lip(curet)   Hyperlipidemia    Osteopenia    Skin cancer    hx of scalp   Thalassemia     Past Surgical History:  Procedure Laterality Date   APPENDECTOMY     BUNIONECTOMY     complicated with fractured toe x 2 feet   CATARACT EXTRACTION     bilateral   CESAREAN SECTION     x2   COLONOSCOPY  2003   DILATION AND CURETTAGE OF  UTERUS     miscarriage     x2   skin cancer scalp     TONSILLECTOMY AND ADENOIDECTOMY     TUBAL LIGATION       Current Outpatient Medications:    atorvastatin (LIPITOR) 10 MG tablet, Take 1 tablet (10 mg total) by mouth daily., Disp: 90 tablet, Rfl: 3   Calcium Carbonate-Vitamin D (CALCIUM + D PO), Take by mouth., Disp: , Rfl:    Cyanocobalamin (B-12) 1000 MCG SUBL, Place under the tongue., Disp: , Rfl:   EXAM:  VITALS per patient if applicable:  GENERAL: alert, oriented, appears well and in no acute distress  HEENT: atraumatic, conjunttiva clear, no obvious abnormalities on inspection of external nose and ears  NECK: normal movements of the head and neck  LUNGS: on inspection no signs of respiratory distress, breathing rate appears normal, no obvious gross SOB, gasping or wheezing  CV: no obvious cyanosis  MS: moves all visible extremities without noticeable abnormality  PSYCH/NEURO: pleasant and cooperative, no obvious depression or anxiety, speech and thought processing grossly intact  ASSESSMENT AND PLAN:  Discussed the following assessment and plan:  Vertigo  -we discussed possible serious and likely etiologies, options for evaluation and workup, limitations of telemedicine visit vs in person visit, treatment, treatment risks and precautions. Pt prefers to treat via telemedicine empirically rather than in person at this moment. Query BPPV vs other given her symptomatic report. She looks well on exam and reports  she is doing much better now. She opted to try home vertigo exercises - link provided for possible BPPV and seek inperson care if symptoms to not resolve, worsen or new symptoms occur.   Discussed options for inperson care if PCP office not available. Did let this patient know that I only do telemedicine on Tuesdays and Thursdays for [facility]. Advised to schedule follow up visit with PCP or [facility] if any further questions or concerns to avoid delays in care.   I  discussed the assessment and treatment plan with the patient. The patient was provided an opportunity to ask questions and all were answered. The patient agreed with the plan and demonstrated an understanding of the instructions.     Lucretia Kern, DO

## 2020-12-28 NOTE — Telephone Encounter (Signed)
Nurse Assessment Nurse: Raenette Rover, RN, Zella Ball Date/Time (Eastern Time): 12/28/2020 9:22:51 AM Confirm and document reason for call. If symptomatic, describe symptoms. ---Caller has vertigo. Began around 1 am and went back to bed and with turning over in bed the room just moves. Having to hold onto things to walk. Does the patient have any new or worsening symptoms? ---Yes Will a triage be completed? ---Yes Related visit to physician within the last 2 weeks? ---No Does the PT have any chronic conditions? (i.e. diabetes, asthma, this includes High risk factors for pregnancy, etc.) ---No Is this a behavioral health or substance abuse call? ---No Guidelines Guideline Title Affirmed Question Affirmed Notes Nurse Date/Time (Eastern Time) Dizziness - Vertigo SEVERE dizziness (vertigo) (e.g., unable to walk without assistance) Raenette Rover, RN, [facility] 12/28/2020 9:23:59 AM Disp. Time Eilene Ghazi Time) Disposition Final User 12/28/2020 9:27:51 AM Go to ED Now (or PCP triage) Yes Raenette Rover, RN, Zella Ball PLEASE NOTE: All timestamps contained within this report are represented as [place] Standard Time. CONFIDENTIALTY NOTICE: This fax transmission is intended only for the addressee. It contains information that is legally privileged, confidential or otherwise protected from use or disclosure. If you are not the intended recipient, you are strictly prohibited from reviewing, disclosing, copying using or disseminating any of this information or taking any action in reliance on or regarding this information. If you have received this fax in error, please notify us immediately by telephone so that we can arrange for its return to [place]. Phone: [phone], Toll-Free: [phone], Fax: [phone] Page: 2 of 2 Call Id: [id number] [place] Disagree/Comply Comply Caller Understands Yes PreDisposition Did not know what to do Care Advice Given Per Guideline GO TO ED NOW (OR PCP TRIAGE): * Another adult should drive. CARE  ADVICE given per Dizziness - Vertigo (Adult) guideline. Comments User: Wilson Singer, RN Date/Time Eilene Ghazi Time): 12/28/2020 9:30:31 AM Patient doesnt want to go to the ER. The outcome could also be UC or PCP, transferred to back line for urgent appt. Referrals Warm transfer to backline

## 2020-12-28 NOTE — Telephone Encounter (Signed)
Pt scheduled with Dr. Maudie Mercury for today.

## 2021-01-05 ENCOUNTER — Other Ambulatory Visit (HOSPITAL_BASED_OUTPATIENT_CLINIC_OR_DEPARTMENT_OTHER): Payer: Self-pay

## 2021-01-05 ENCOUNTER — Ambulatory Visit: Payer: Medicare Other | Attending: Internal Medicine

## 2021-01-05 DIAGNOSIS — Z23 Encounter for immunization: Secondary | ICD-10-CM

## 2021-01-05 MED ORDER — PFIZER-BIONT COVID-19 VAC-TRIS 30 MCG/0.3ML IM SUSP
INTRAMUSCULAR | 0 refills | Status: DC
  Filled 2021-01-05: qty 0.3, 1d supply, fill #0

## 2021-01-05 NOTE — Progress Notes (Signed)
   Covid-19 Vaccination Clinic  Name:  Abigail Shah    MRN: [id number] DOB: April 01, 1941  01/05/2021  Abigail Shah was observed post Covid-19 immunization for 15 minutes without incident. She was provided with Vaccine Information Sheet and instruction to access the V-Safe system.   Abigail Shah was instructed to call 911 with any severe reactions post vaccine: Difficulty breathing  Swelling of face and throat  A fast heartbeat  A bad rash all over body  Dizziness and weakness   Immunizations Administered     Name Date Dose VIS Date Route   PFIZER Comrnaty(Gray TOP) Covid-19 Vaccine 01/05/2021 10:32 AM 0.3 mL 05/27/2020 Intramuscular   Manufacturer: [place]   Lot: [id number]   [place]: [phone]

## 2021-01-06 ENCOUNTER — Ambulatory Visit: Payer: Medicare Other

## 2021-02-24 ENCOUNTER — Encounter: Payer: Self-pay | Admitting: Family

## 2021-02-24 ENCOUNTER — Other Ambulatory Visit: Payer: Self-pay

## 2021-02-24 ENCOUNTER — Ambulatory Visit (INDEPENDENT_AMBULATORY_CARE_PROVIDER_SITE_OTHER): Payer: Medicare Other | Admitting: Family

## 2021-02-24 VITALS — BP 150/72 | HR 83 | Temp 98.1°F | Ht 63.0 in | Wt 155.8 lb

## 2021-02-24 DIAGNOSIS — E785 Hyperlipidemia, unspecified: Secondary | ICD-10-CM

## 2021-02-24 DIAGNOSIS — R42 Dizziness and giddiness: Secondary | ICD-10-CM | POA: Diagnosis not present

## 2021-02-24 LAB — COMPREHENSIVE METABOLIC PANEL
ALT: 13 U/L (ref 0–35)
AST: 17 U/L (ref 0–37)
Albumin: 4.4 g/dL (ref 3.5–5.2)
Alkaline Phosphatase: 68 U/L (ref 39–117)
BUN: 22 mg/dL (ref 6–23)
CO2: 29 mEq/L (ref 19–32)
Calcium: 9.7 mg/dL (ref 8.4–10.5)
Chloride: 103 mEq/L (ref 96–112)
Creatinine, Ser: 0.89 mg/dL (ref 0.40–1.20)
GFR: 60.29 mL/min (ref >60.00)
Glucose, Bld: 91 mg/dL (ref 70–99)
Potassium: 4.3 mEq/L (ref 3.5–5.1)
Sodium: 139 mEq/L (ref 135–145)
Total Bilirubin: 0.5 mg/dL (ref 0.2–1.2)
Total Protein: 6.6 g/dL (ref 6.0–8.3)

## 2021-02-24 LAB — POCT URINALYSIS DIPSTICK
Bilirubin, UA: NEGATIVE
Blood, UA: NEGATIVE
Glucose, UA: NEGATIVE
Ketones, UA: NEGATIVE
Leukocytes, UA: NEGATIVE
Nitrite, UA: NEGATIVE
Protein, UA: NEGATIVE
Spec Grav, UA: 1.015 (ref 1.010–1.025)
Urobilinogen, UA: 0.2 E.U./dL
pH, UA: 6 (ref 5.0–8.0)

## 2021-02-24 LAB — LIPID PANEL
Cholesterol: 198 mg/dL (ref 0–200)
HDL: 59.4 mg/dL (ref >39.00)
LDL Cholesterol: 108 mg/dL — ABNORMAL HIGH (ref 0–99)
NonHDL: 138.31
Total CHOL/HDL Ratio: 3
Triglycerides: 154 mg/dL — ABNORMAL HIGH (ref 0.0–149.0)
VLDL: 30.8 mg/dL (ref 0.0–40.0)

## 2021-02-24 LAB — CBC WITH DIFFERENTIAL/PLATELET
Basophils Absolute: 0.1 10*3/uL (ref 0.0–0.1)
Basophils Relative: 0.8 % (ref 0.0–3.0)
Eosinophils Absolute: 0.1 10*3/uL (ref 0.0–0.7)
Eosinophils Relative: 1.2 % (ref 0.0–5.0)
HCT: 36.1 % (ref 36.0–46.0)
Hemoglobin: 11.7 g/dL — ABNORMAL LOW (ref 12.0–15.0)
Lymphocytes Relative: 45.1 % (ref 12.0–46.0)
Lymphs Abs: 3.1 10*3/uL (ref 0.7–4.0)
MCHC: 32.3 g/dL (ref 30.0–36.0)
MCV: 65.3 fl — ABNORMAL LOW (ref 78.0–100.0)
Monocytes Absolute: 0.5 10*3/uL (ref 0.1–1.0)
Monocytes Relative: 7.8 % (ref 3.0–12.0)
Neutro Abs: 3.1 10*3/uL (ref 1.4–7.7)
Neutrophils Relative %: 45.1 % (ref 43.0–77.0)
Platelets: 233 10*3/uL (ref 150.0–400.0)
RBC: 5.53 Mil/uL — ABNORMAL HIGH (ref 3.87–5.11)
RDW: 15.7 % — ABNORMAL HIGH (ref 11.5–15.5)
WBC: 6.9 10*3/uL (ref 4.0–10.5)

## 2021-02-24 LAB — TSH: TSH: 1.38 u[IU]/mL (ref 0.35–5.50)

## 2021-02-24 NOTE — Progress Notes (Signed)
Acute Office Visit  Subjective:    Patient ID: Abigail Shah, female    DOB: 12-02-1940, 80 y.o.   MRN: [id number]  Chief Complaint  Patient presents with  . Dizziness    Pt c/o vertigo x57monthand she states she fell in church on Sunday during church and she c/o headache since the fall.    HPI Patient is in today with complaints of vertigo off and on x1 month.  Patient reports that the vertigo is positional typically first thing in the morning when she arises from her bed.  Throughout the day, she typically does not experience any vertigo.  However, this past Monday reports leaning over to drop her communion cup into the basket and falling over.  She does not remember any particular dizziness or vertigo.  Denies any injury.  No head injury.  Denies any nausea, vomiting, chest pain, or palpitations.  Reports that she has had increased stress as she [place]  However, she is excited about the move but the preparation is causing stress.  Past Medical History:  Diagnosis Date  . Anemia   . Atypical nevus 05/21/2003   Right Fifth Toe-Slight  . Atypical nevus 11/27/2012   Right Hip-Moderate  . Atypical nevus 12/27/2016   Left Neck-Moderate(widershave)  . Atypical nevus x 2 10/21/2001   Left Lower Back-Slight to Moderate and Right Post Upper Arm- Slight  . BCC (basal cell carcinoma of skin) 10/21/2001   Left Side Scalp  . BCC (basal cell carcinoma of skin) 11/23/2009   Left Forehead(curet and cautery)  . BCC (basal cell carcinoma of skin) 08/01/2011   Left Upper Lip(curet)  . Hyperlipidemia   . Osteopenia   . Skin cancer    hx of scalp  . Thalassemia     Past Surgical History:  Procedure Laterality Date  . APPENDECTOMY    . BUNIONECTOMY     complicated with fractured toe x 2 feet  . CATARACT EXTRACTION     bilateral  . CESAREAN SECTION     x2  . COLONOSCOPY  2003  . DILATION AND CURETTAGE OF UTERUS    . miscarriage     x2  . skin  cancer scalp    . TONSILLECTOMY AND ADENOIDECTOMY    . TUBAL LIGATION      Family History  Problem Relation Age of Onset  . Dementia Mother   . Hyperlipidemia Father   . Heart attack Father 784 . Multiple sclerosis Brother        actually radiation poisoning  . Hyperlipidemia Other   . Stroke Other   . Breast cancer Neg Hx     Social History   Socioeconomic History  . Marital status: Widowed    Spouse name: Not on file  . Number of children: Not on file  . Years of education: Not on file  . Highest education level: Not on file  Occupational History  . Not on file  Tobacco Use  . Smoking status: Never  . Smokeless tobacco: Never  Substance and Sexual Activity  . Alcohol use: No  . Drug use: No  . Sexual activity: Never    Birth control/protection: Post-menopausal  Other Topics Concern  . Not on file  Social History Narrative   Lives alone. Widowed 1987. 2 children. 4 grandkids. No greatgrandkids.    [place]ives[place] for a few years)  Retired from Audiological scientist. Still doing a lot.       Hobbies: a lot of travel because son lives overseas mostly, daughter in [place].    Same church since 1962.       Advanced directives. HCPOA- both children. DNR   Social Determinants of Health   Financial Resource Strain: Not on file  Food Insecurity: Not on file  Transportation Needs: Not on file  Physical Activity: Not on file  Stress: Not on file  Social Connections: Not on file  Intimate Partner Violence: Not on file    Outpatient Medications Prior to Visit  Medication Sig Dispense Refill  . atorvastatin (LIPITOR) 10 MG tablet Take 1 tablet (10 mg total) by mouth daily. 90 tablet 3  . Calcium Carbonate-Vitamin D (CALCIUM + D PO) Take by mouth.    Marland Kitchen COVID-19 mRNA Vac-TriS, Pfizer, (PFIZER-BIONT COVID-19 VAC-TRIS) SUSP injection Inject into the muscle. 0.3 mL 0  . Cyanocobalamin (B-12) 1000 MCG SUBL Place  under the tongue.     No facility-administered medications prior to visit.    No Known Allergies  Review of Systems  Constitutional:  Negative for fatigue and fever.  Cardiovascular:  Negative for chest pain and palpitations.  Gastrointestinal:  Negative for nausea and vomiting.  Genitourinary:  Negative for frequency and urgency.  Musculoskeletal: Negative.   Allergic/Immunologic: Negative.   Neurological:  Positive for dizziness. Negative for weakness and numbness.  Psychiatric/Behavioral: Negative.    All other systems reviewed and are negative.     Objective:    Physical Exam Vitals and nursing note reviewed.  Constitutional:      Appearance: Normal appearance. She is normal weight.  HENT:     Head: Normocephalic and atraumatic.     Right Ear: Tympanic membrane, ear canal and external ear normal. There is no impacted cerumen.     Left Ear: Tympanic membrane, ear canal and external ear normal.     Mouth/Throat:     Mouth: Mucous membranes are dry.  Eyes:     Extraocular Movements: Extraocular movements intact.     Pupils: Pupils are equal, round, and reactive to light.  Cardiovascular:     Rate and Rhythm: Normal rate and regular rhythm.     Pulses: Normal pulses.  Pulmonary:     Effort: Pulmonary effort is normal.     Breath sounds: Normal breath sounds.  Abdominal:     General: Abdomen is flat.     Palpations: Abdomen is soft.  Musculoskeletal:        General: Normal range of motion.     Cervical back: Normal range of motion and neck supple.  Skin:    General: Skin is warm and dry.  Neurological:     General: No focal deficit present.     Mental Status: She is alert and oriented to person, place, and time.     Motor: No weakness.     Gait: Gait normal.     Deep Tendon Reflexes: Reflexes normal.  Psychiatric:        Mood and Affect: Mood normal.   BP (!) 150/72   Pulse 83   Temp 98.1 F (36.7 C)   Ht _0  (1.6 m)   Wt 155 lb 12.8 oz (70.7 kg)   LMP   (LMP Unknown)   SpO2 96%   BMI 27.60 kg/m  Wt Readings from Last 3 Encounters:  02/24/21 155 lb 12.8 oz (70.7 kg)  12/28/20 155 lb (70.3 kg)  04/16/20 159 lb 9.6 oz (72.4 kg)    Health Maintenance Due  Topic Date Due  . Zoster Vaccines- Shingrix (1 of 2) Never done  . INFLUENZA VACCINE  01/17/2021  . TETANUS/TDAP  01/19/2021    There are no preventive care reminders to display for this patient.   Lab Results  Component Value Date   TSH 2.25 05/26/2014   Lab Results  Component Value Date   WBC 4.9 04/16/2020   HGB 11.8 04/16/2020   HCT 37.1 04/16/2020   MCV 68.7 (L) 04/16/2020   PLT 246 04/16/2020   Lab Results  Component Value Date   NA 140 04/16/2020   K 4.7 04/16/2020   CO2 27 04/16/2020   GLUCOSE 97 04/16/2020   BUN 17 04/16/2020   CREATININE 0.84 04/16/2020   BILITOT 0.8 04/16/2020   ALKPHOS 56 03/27/2019   AST 20 04/16/2020   ALT 14 04/16/2020   PROT 6.9 04/16/2020   ALBUMIN 4.5 03/27/2019   CALCIUM 9.6 04/16/2020   GFR 68.89 03/27/2019   Lab Results  Component Value Date   CHOL 305 (H) 04/16/2020   Lab Results  Component Value Date   HDL 57 04/16/2020   Lab Results  Component Value Date   LDLCALC 215 (H) 04/16/2020   Lab Results  Component Value Date   TRIG 161 (H) 04/16/2020   Lab Results  Component Value Date   CHOLHDL 5.4 (H) 04/16/2020   No results found for: HGBA1C     Assessment & Plan:   Problem List Items Addressed This Visit     Hyperlipidemia   Relevant Orders   Comp Met (CMET)   TSH   Lipid Profile   Other Visit Diagnoses     Vertigo    -  Primary   Relevant Orders   Comp Met (CMET)   CBC w/Diff   TSH   Lipid Profile   POC Urinalysis Dipstick       Plan: We will obtain labs today notify patient pending results.  Offered Antivert, since vertigo is positional patient declines.  Increase intake of fluids.  Rest.  Ambulate with caution.  Follow-up pending labs, as scheduled, and sooner as  needed.    Kennyth Arnold, FNP

## 2021-02-24 NOTE — Patient Instructions (Signed)
Vertigo Vertigo is the feeling that you or your surroundings are moving when they are not. This feeling can come and go at any time. Vertigo often goes away on its own. Vertigo can be dangerous if it occurs while you are doing something thatcould endanger yourself or others, such as driving or operating machinery. Your health care provider will do tests to try to determine the cause of your vertigo. Tests will also help your health care provider decide how best totreat your condition. Follow these instructions at home: Eating and drinking     Dehydration can make vertigo worse. Drink enough fluid to keep your urine pale yellow. Do not drink alcohol. Activity Return to your normal activities as told by your health care provider. Ask your health care provider what activities are safe for you. In the morning, first sit up on the side of the bed. When you feel okay, stand slowly while you hold onto something until you know that your balance is fine. Move slowly. Avoid sudden body or head movements or certain positions, as told by your health care provider. If you have trouble walking or keeping your balance, try using a cane for stability. If you feel dizzy or unstable, sit down right away. Avoid doing any tasks that would cause danger to you or others if vertigo occurs. Avoid bending down if you feel dizzy. Place items in your home so that they are easy for you to reach without bending or leaning over. Do not drive or use machinery if you feel dizzy. General instructions Take over-the-counter and prescription medicines only as told by your health care provider. Keep all follow-up visits. This is important. Contact a health care provider if: Your medicines do not relieve your vertigo or they make it worse. Your condition gets worse or you develop new symptoms. You have a fever. You develop nausea or vomiting, or if nausea gets worse. Your family or friends notice any behavioral changes. You  have numbness or a prickling and tingling sensation in part of your body. Get help right away if you: Are always dizzy or you faint. Develop severe headaches. Develop a stiff neck. Develop sensitivity to light. Have difficulty moving or speaking. Have weakness in your hands, arms, or legs. Have changes in your hearing or vision. These symptoms may represent a serious problem that is an emergency. Do not wait to see if the symptoms will go away. Get medical help right away. Call your local emergency services (911 in the [place] Do not drive yourself to the hospital. Summary Vertigo is the feeling that you or your surroundings are moving when they are not. Your health care provider will do tests to try to determine the cause of your vertigo. Follow instructions for home care. You may be told to avoid certain tasks, positions, or movements. Contact a health care provider if your medicines do not relieve your symptoms, or if you have a fever, nausea, vomiting, or changes in behavior. Get help right away if you have severe headaches or difficulty speaking, or you develop hearing or vision problems. This information is not intended to replace advice given to you by your health care provider. Make sure you discuss any questions you have with your healthcare provider. Document Revised: 05/05/2020 Document Reviewed: 05/05/2020 Elsevier Patient Education  2022 Elsevier Inc.  

## 2021-03-02 ENCOUNTER — Other Ambulatory Visit: Payer: Self-pay

## 2021-03-02 ENCOUNTER — Telehealth: Payer: Self-pay

## 2021-03-02 DIAGNOSIS — R42 Dizziness and giddiness: Secondary | ICD-10-CM

## 2021-03-02 NOTE — Telephone Encounter (Signed)
Pt called asking for a referral to Dr Wilburn Cornelia. She stated that she has come in twice to [place] for her vertigo. She stated that she heard that Dr Wilburn Cornelia treats people with vertigo. Can the referral be placed? Please Advise.

## 2021-03-02 NOTE — Telephone Encounter (Signed)
Referral has been placed. 

## 2021-03-10 ENCOUNTER — Ambulatory Visit: Payer: Medicare Other | Admitting: Audiology

## 2021-04-11 NOTE — Progress Notes (Signed)
Phone [phone]   Subjective:  Patient presents today for their annual physical. Chief complaint-noted.   See problem oriented charting- ROS- full  review of systems was completed and negative except for: drooling per baseline, vertigo   The following were reviewed and entered/updated in epic: Past Medical History:  Diagnosis Date   Anemia    Atypical nevus 05/21/2003   Right Fifth Toe-Slight   Atypical nevus 11/27/2012   Right Hip-Moderate   Atypical nevus 12/27/2016   Left Neck-Moderate(widershave)   Atypical nevus x 2 10/21/2001   Left Lower Back-Slight to Moderate and Right Post Upper Arm- Slight   BCC (basal cell carcinoma of skin) 10/21/2001   Left Side Scalp   BCC (basal cell carcinoma of skin) 11/23/2009   Left Forehead(curet and cautery)   BCC (basal cell carcinoma of skin) 08/01/2011   Left Upper Lip(curet)   Hyperlipidemia    Osteopenia    Skin cancer    hx of scalp   Thalassemia    Patient Active Problem List   Diagnosis Date Noted   Hyperlipidemia 07/04/2007    Priority: Medium    THALASSEMIA NEC 02/15/2007    Priority: Medium    Postmenopausal bleeding 10/23/2014    Priority: Low   Leukopenia 05/20/2014    Priority: Low   Osteoporosis 08/12/2007    Priority: Low   SKIN CANCER, HX OF 07/03/2007    Priority: Low   Past Surgical History:  Procedure Laterality Date   APPENDECTOMY     BUNIONECTOMY     complicated with fractured toe x 2 feet   CATARACT EXTRACTION     bilateral   CESAREAN SECTION     x2   COLONOSCOPY  2003   DILATION AND CURETTAGE OF UTERUS     miscarriage     x2   skin cancer scalp     TONSILLECTOMY AND ADENOIDECTOMY     TUBAL LIGATION     vertigo      Family History  Problem Relation Age of Onset   Dementia Mother    Hyperlipidemia Father    Heart attack Father 82   Multiple sclerosis Brother        actually radiation poisoning   Hyperlipidemia Other    Stroke Other    Breast cancer Neg Hx      Medications- reviewed and updated Current Outpatient Medications  Medication Sig Dispense Refill   Calcium Carbonate-Vitamin D (CALCIUM + D PO) Take by mouth.     Cyanocobalamin (B-12) 1000 MCG SUBL Place under the tongue.     atorvastatin (LIPITOR) 10 MG tablet Take 1 tablet (10 mg total) by mouth daily. 90 tablet 3   No current facility-administered medications for this visit.    Allergies-reviewed and updated No Known Allergies  Social History   Social History Narrative   Lives alone. Widowed 1987. 2 children. 4 grandkids. No greatgrandkids.    Lived in [place] since going to [employer] college (livesd away for a few years)      Retired from Audiological scientist. Still doing a lot.       Hobbies: a lot of travel because son lives overseas mostly, daughter in [place].    Same church since 1962.       Advanced directives. HCPOA- both children. DNR   Objective  Objective:  BP 122/66   Pulse 77   Temp 97.8 F (36.6 C)   Ht 5\' 2"  (1.575 m)   Wt 155 lb 12.8 oz (  70.7 kg)   LMP  (LMP Unknown)   SpO2 95%   BMI 28.50 kg/m  Gen: NAD, resting comfortably HEENT: Mucous membranes are moist. Oropharynx normal Neck: no thyromegaly CV: RRR no murmurs rubs or gallops Lungs: CTAB no crackles, wheeze, rhonchi Abdomen: soft/nontender/nondistended/normal bowel sounds. No rebound or guarding.  Ext: no edema Skin: warm, dry Neuro: grossly normal, moves all extremities, PERRLA   Assessment and Plan   80 y.o. female presenting for annual physical.  Health Maintenance counseling: 1. Anticipatory guidance: Patient counseled regarding regular dental exams -q6 months, eye exams - yearly recommended,  avoiding smoking and second hand smoke , limiting alcohol to 1 beverage per day- occasional white white with dinner- small amount few oz. No illicit drugs.   2. Risk factor reduction:  Advised patient of need for regular exercise and diet rich and fruits and vegetables to reduce risk  of heart attack and stroke. Exercise- [employer] machines 3 days a week.  Diet-Down 4 pounds over the last year- reasonably healthy diet- has reduced calories Wt Readings from Last 3 Encounters:  04/20/21 155 lb 12.8 oz (70.7 kg)  02/24/21 155 lb 12.8 oz (70.7 kg)  12/28/20 155 lb (70.3 kg)  3. Immunizations/screenings/ancillary studies- already had flu shot DISCUSSED:  -Shingrix vaccination #1- recommended at pharmacy -TDAP vaccination (last one 08/12) - recommended at pharmacy -covid booster - recommended bivalent vaccination at pharmacy Immunization History  Administered Date(s) Administered   DTaP 01/20/2011   Fluad Quad(high Dose 65+) 04/16/2020   Hepatitis A 04/27/2006, 11/01/2006   Hepatitis B 04/27/2006, 05/28/2006, 11/01/2006   Influenza Split 03/27/2011, 04/03/2012   Influenza Whole 03/19/2006, 03/17/2009   Influenza, High Dose Seasonal PF 02/20/2018, 02/26/2019   Influenza,inj,Quad PF,6+ Mos 04/14/2013   Influenza-Unspecified 04/02/2015, 04/17/2017, 04/16/2021   Meningococcal Polysaccharide 04/12/2006   PFIZER Comirnaty(Gray Top)Covid-19 Tri-Sucrose Vaccine 01/05/2021   PFIZER(Purple Top)SARS-COV-2 Vaccination 07/06/2019, 07/26/2019, 03/26/2020   PPD Test 05/06/2012   Pneumococcal Conjugate-13 05/20/2014   Pneumococcal Polysaccharide-23 03/19/2004   Td 03/19/2006   Zoster, Live 04/12/2006  4. Cervical cancer screening-  past age based screening recommendations.  No vaginal discharge or bleeding today-no pelvic plan today-we opted out of pelvic exam 5. Breast cancer screening-patient discontinued screening in 2020-past age based screening recommendations  6. Colon cancer screening - colonoscopy 06/19/2001 -  past age based screening recommendations.  Normal stool cards in 2015 and 2017.  We have opted to discontinue screening.  Denies blood in stool. No melena  7. Skin cancer screening-follows with dermatology  advised regular sunscreen use. Denies worrisome, changing, or  new skin lesions.  8. Birth control/STD check- not sexually active  9. Osteoporosis screening at 46- DEXA 04/02/2019 -Never smoker  Status of chronic or acute concerns   #Vertigo positional sounds like BPPV-patient seen for visit on 02/24/2021 at our office-labs were reassuring.  Patient declined Antivert-vestibular rehab was ordered on 03/02/2021 and patient was referred to ENT . Had been seen as far back 12/28/20 for video visits.   - patient states primary issues right now are with turning over at night but overall improved. Offered referral to vestibular rehab- she wants to hold off for now but may contact us later. Unfortunately this referral was cancelled due to also having ENT referral (and patient was never contacted about ENT referral) - I wanted her to have both so we are both disappointed  (she was only called about a hearing test which she did not need- working with someone else on hearing)  #hyperlipidemia  S: Medication:Atorvastatin 10Mg   daily Lab Results  Component Value Date   CHOL 198 02/24/2021   HDL 59.40 02/24/2021   LDLCALC 108 (H) 02/24/2021   LDLDIRECT 179.8 05/06/2012   TRIG 154.0 (H) 02/24/2021   CHOLHDL 3 02/24/2021   A/P: Cholesterol mildly elevated but under her age for primary prevention do not feel strongly about increasing dose   # Osteoporosis S: Last DEXA: 04/02/2019 Medication (bisphosphonate or prolia): Fosamax started 10/23/2014 through 2021   Calcium: 1200mg  (through diet ok) recommended  Vitamin D: 1000 units a day recommended A/P: Osteoporosis hopefully stable-did 5 years of Fosamax through last year-update bone density at this time-likely only restart if there is significant worsening. Continue calcium/vitamin D and weight bearing exercise - vit D- prefers to recheck next year- had labs in September and this was not checked- slightly low last year but has been on vit d      #Drooling-long-term issues-worsened with invasalign- still with retainer now   Continued to monitor   #Leukopenia noted in the past but not on last labs.  Patient also has thalassemia which causes anemia Lab Results  Component Value Date   WBC 6.9 02/24/2021   HGB 11.7 (L) 02/24/2021   HCT 36.1 02/24/2021   MCV 65.3 Repeated and verified X2. (L) 02/24/2021   PLT 233.0 02/24/2021    # dupuytren on right hand- seen Dr. Georgina Snell and Dr.Gramig I believe in past. No surgery recommended- no worsening  Recommended follow up: Return in about 1 year (around 04/20/2022) for physical or sooner if needed. Future Appointments  Date Time Provider [place]  11/03/2021 10:00 AM [place], Ronalee Red, PA-C [facility]   Lab/Order associations:NOT fasting   ICD-10-CM   1. Preventative health care  Z00.00     2. Hyperlipidemia, unspecified hyperlipidemia type  E78.5     3. Age-related osteoporosis without current pathological fracture  M81.0 DG Bone Density    4. Other thalassemia ([place]) Chronic D56.8       Meds ordered this encounter  Medications   atorvastatin (LIPITOR) 10 MG tablet    Sig: Take 1 tablet (10 mg total) by mouth daily.    Dispense:  90 tablet    Refill:  3    I,Jada Bradford,acting as a scribe for Garret Reddish, MD.,have documented all relevant documentation on the behalf of Garret Reddish, MD,as directed by  Garret Reddish, MD while in the presence of Garret Reddish, MD.  I, Garret Reddish, MD, have reviewed all documentation for this visit. The documentation on 04/20/21 for the exam, diagnosis, procedures, and orders are all accurate and complete.  Return precautions advised.  Garret Reddish, MD

## 2021-04-20 ENCOUNTER — Other Ambulatory Visit: Payer: Self-pay

## 2021-04-20 ENCOUNTER — Ambulatory Visit (INDEPENDENT_AMBULATORY_CARE_PROVIDER_SITE_OTHER): Payer: Medicare Other | Admitting: Family Medicine

## 2021-04-20 ENCOUNTER — Encounter: Payer: Self-pay | Admitting: Family Medicine

## 2021-04-20 VITALS — BP 122/66 | HR 77 | Temp 97.8°F | Ht 62.0 in | Wt 155.8 lb

## 2021-04-20 DIAGNOSIS — Z0000 Encounter for general adult medical examination without abnormal findings: Secondary | ICD-10-CM

## 2021-04-20 DIAGNOSIS — E785 Hyperlipidemia, unspecified: Secondary | ICD-10-CM | POA: Diagnosis not present

## 2021-04-20 DIAGNOSIS — D568 Other thalassemias: Secondary | ICD-10-CM | POA: Diagnosis not present

## 2021-04-20 DIAGNOSIS — M810 Age-related osteoporosis without current pathological fracture: Secondary | ICD-10-CM

## 2021-04-20 DIAGNOSIS — Z23 Encounter for immunization: Secondary | ICD-10-CM

## 2021-04-20 MED ORDER — ATORVASTATIN CALCIUM 10 MG PO TABS: 10.0000 mg | ORAL_TABLET | Freq: Every day | ORAL | 3 refills | Status: DC

## 2021-04-20 NOTE — Patient Instructions (Addendum)
-  Shingrix vaccination #1- recommended at [place] vaccination (last one 01/2011) - recommended at pharmacy -covid booster - recommended bivalent vaccination at pharmacy  Schedule bone density at the desk before you leave today OR you can call back to schedule  Recommended follow up: Return in about 1 year (around 04/20/2022) for physical or sooner if needed.

## 2021-04-26 ENCOUNTER — Other Ambulatory Visit: Payer: Self-pay

## 2021-04-26 ENCOUNTER — Ambulatory Visit (INDEPENDENT_AMBULATORY_CARE_PROVIDER_SITE_OTHER)
Admission: RE | Admit: 2021-04-26 | Discharge: 2021-04-26 | Disposition: A | Discharge status: Home / Self Care | Payer: Medicare Other | Source: Ambulatory Visit | Attending: Family Medicine | Admitting: Family Medicine

## 2021-04-26 DIAGNOSIS — M810 Age-related osteoporosis without current pathological fracture: Secondary | ICD-10-CM

## 2021-06-22 NOTE — Progress Notes (Incomplete)
Phone [phone] In person visit   Subjective:   Abigail Shah is a 81 y.o. year old very pleasant female patient who presents for/with See problem oriented charting No chief complaint on file.   This visit occurred during the SARS-CoV-2 public health emergency.  Safety protocols were in place, including screening questions prior to the visit, additional usage of staff PPE, and extensive cleaning of exam room while observing appropriate contact time as indicated for disinfecting solutions.   Past Medical History-  Patient Active Problem List   Diagnosis Date Noted   Postmenopausal bleeding 10/23/2014   Leukopenia 05/20/2014   Osteoporosis 08/12/2007   Hyperlipidemia 07/04/2007   SKIN CANCER, HX OF 07/03/2007   THALASSEMIA NEC 02/15/2007    Medications- reviewed and updated Current Outpatient Medications  Medication Sig Dispense Refill   atorvastatin (LIPITOR) 10 MG tablet Take 1 tablet (10 mg total) by mouth daily. 90 tablet 3   Calcium Carbonate-Vitamin D (CALCIUM + D PO) Take by mouth.     Cyanocobalamin (B-12) 1000 MCG SUBL Place under the tongue.     No current facility-administered medications for this visit.     Objective:  LMP  (LMP Unknown)  Gen: NAD, resting comfortably CV: RRR no murmurs rubs or gallops Lungs: CTAB no crackles, wheeze, rhonchi Abdomen: soft/nontender/nondistended/normal bowel sounds. No rebound or guarding.  Ext: no edema Skin: warm, dry Neuro: grossly normal, moves all extremities  ***    Assessment and Plan   #Vertigo positional sounds like BPPV-patient seen for visit on 02/24/2021 at our office-labs were reassured.  Patient declined Antivert-vestibular rehab was ordered on 03/02/2021 and patient was referred to ENT . Had been seen as far back 12/28/20 for video visits.   - patient stated primary issues on 04/20/21 visit, which were turning over at night but overall improved. Offered referral to vestibular rehab- she wanted to hold off  around the timebut may contact us later. Unfortunately this referral was cancelled due to also having ENT referral (and patient was never contacted about ENT referral) - I wanted her to have both so we are both disappointed  (she was only called about a hearing test which she did not need- working with someone else on hearing)   #hyperlipidemia S: Medication:Atorvastatin 10 mg daily Lab Results  Component Value Date   CHOL 198 02/24/2021   HDL 59.40 02/24/2021   LDLCALC 108 (H) 02/24/2021   LDLDIRECT 179.8 05/06/2012   TRIG 154.0 (H) 02/24/2021   CHOLHDL 3 02/24/2021   A/P: ***   # Osteoporosis S: Last DEXA: 04/02/2019 Medication (bisphosphonate or prolia): Fosamax started 10/23/2014 through 2021   Calcium: 1200mg  (through diet ok) recommended  Vitamin D: 1000 units a day recommended A/P: ***    #Drooling-long-term issues-worsened with invasalign- still with retainer around the time.Continued to monitor   #Leukopenia noted in the past but not on last labs.  Patient also has thalassemia which caused anemia   # dupuytren on right hand- seen Dr. Georgina Snell and Dr.Gramig I believe in past. No surgery recommended- not worsened.     Health Maintenance Due  Topic Date Due   Zoster Vaccines- Shingrix (1 of 2) Never done   TETANUS/TDAP  01/19/2021   COVID-19 Vaccine (5 - Booster for Pfizer series) 03/02/2021   Recommended follow up: No follow-ups on file. Future Appointments  Date Time Provider [place]  07/04/2021  8:20 AM Marin Olp, MD [facility]  11/03/2021 10:00 AM Warren Danes, PA-C [facility]  04/25/2022  8:20 AM Yong Channel Brayton Mars, MD [facility]    Lab/Order associations: No diagnosis found.  No orders of the defined types were placed in this encounter.  I,Jada Bradford,acting as a scribe for Garret Reddish, MD.,have documented all relevant documentation on the behalf of Garret Reddish, MD,as directed by  Garret Reddish, MD while in the presence of  Garret Reddish, MD.  ***  Return precautions advised.  Burnett Corrente

## 2021-07-04 ENCOUNTER — Ambulatory Visit: Payer: Medicare Other | Admitting: Family Medicine

## 2021-07-04 DIAGNOSIS — E785 Hyperlipidemia, unspecified: Secondary | ICD-10-CM

## 2021-07-04 DIAGNOSIS — M810 Age-related osteoporosis without current pathological fracture: Secondary | ICD-10-CM

## 2021-07-04 DIAGNOSIS — R42 Dizziness and giddiness: Secondary | ICD-10-CM

## 2021-08-05 DIAGNOSIS — H9193 Unspecified hearing loss, bilateral: Secondary | ICD-10-CM | POA: Insufficient documentation

## 2021-08-05 DIAGNOSIS — H8110 Benign paroxysmal vertigo, unspecified ear: Secondary | ICD-10-CM | POA: Insufficient documentation

## 2021-08-22 DIAGNOSIS — H8111 Benign paroxysmal vertigo, right ear: Secondary | ICD-10-CM | POA: Diagnosis not present

## 2021-08-26 ENCOUNTER — Telehealth: Payer: Self-pay | Admitting: Family Medicine

## 2021-08-26 NOTE — Telephone Encounter (Signed)
Pt is wanting to have her immunizations updated with the [place] registry, specifically the one on 05/20/2014 for Pneumococcal Conjugate-13.  ? ?

## 2021-08-26 NOTE — Telephone Encounter (Signed)
NCIR has been updated.  ?

## 2021-09-08 ENCOUNTER — Telehealth: Payer: Self-pay

## 2021-09-08 NOTE — Telephone Encounter (Signed)
Type of form received:Health Information paperwork  ? ? ?Form should be Faxed to: [facility] - [phone] ? ? ?Form placed:  [facility] Folder  ? ?Attach charge sheet.  ? ?Individual made aware of 3-5 business day turn around (Y/N)? ? ?

## 2021-09-08 NOTE — Telephone Encounter (Signed)
Noted  

## 2021-10-03 DIAGNOSIS — D3132 Benign neoplasm of left choroid: Secondary | ICD-10-CM | POA: Diagnosis not present

## 2021-10-10 ENCOUNTER — Telehealth: Payer: Self-pay | Admitting: Family Medicine

## 2021-10-10 NOTE — Telephone Encounter (Signed)
Spoke with patient she req CB in June 2023 ?

## 2021-11-03 ENCOUNTER — Encounter: Payer: Self-pay | Admitting: Physician Assistant

## 2021-11-03 ENCOUNTER — Ambulatory Visit: Payer: Medicare Other | Admitting: Physician Assistant

## 2021-11-03 DIAGNOSIS — Z86018 Personal history of other benign neoplasm: Secondary | ICD-10-CM

## 2021-11-03 DIAGNOSIS — L821 Other seborrheic keratosis: Secondary | ICD-10-CM

## 2021-11-03 DIAGNOSIS — Z85828 Personal history of other malignant neoplasm of skin: Secondary | ICD-10-CM

## 2021-11-03 DIAGNOSIS — Z1283 Encounter for screening for malignant neoplasm of skin: Secondary | ICD-10-CM | POA: Diagnosis not present

## 2021-11-03 DIAGNOSIS — L570 Actinic keratosis: Secondary | ICD-10-CM | POA: Diagnosis not present

## 2021-11-03 DIAGNOSIS — L814 Other melanin hyperpigmentation: Secondary | ICD-10-CM

## 2021-11-03 NOTE — Progress Notes (Signed)
   Follow-Up Visit   Subjective  Abigail Shah is a 81 y.o. female who presents for the following: Annual Exam (Here for full body skin exam. Wants her back checked. History of non mole skin cancers. History of atypical mole. ).   The following portions of the chart were reviewed this encounter and updated as appropriate:  Tobacco  Allergies  Meds  Problems  Med Hx  Surg Hx  Fam Hx      Objective  Well appearing patient in no apparent distress; mood and affect are within normal limits.  All skin waist up examined.  Waist up skin examination- No atypical nevi or signs of NMSC noted at the time of the visit.   mid nose (2) Erythematous patches with gritty scale.   Assessment & Plan  Encounter for screening for malignant neoplasm of skin  Yearly skin examinations  AK (actinic keratosis) (2) mid nose  Destruction of lesion - mid nose Complexity: simple   Destruction method: cryotherapy   Informed consent: discussed and consent obtained   Timeout:  patient name, date of birth, surgical site, and procedure verified Lesion destroyed using liquid nitrogen: Yes   Cryotherapy cycles:  3 Outcome: patient tolerated procedure well with no complications    Lentigines - Scattered tan macules - Discussed due to sun exposure - Benign, observe - Call for any changes  Seborrheic Keratoses- back - Stuck-on, waxy, tan-brown papules and plaques  - Discussed benign etiology and prognosis. - Observe- ok to leave if stable - Call for any changes   I, Dung Prien, PA-C, have reviewed all documentation's for this visit.  The documentation on 11/03/21 for the exam, diagnosis, procedures and orders are all accurate and complete.

## 2021-11-09 ENCOUNTER — Telehealth: Payer: Self-pay | Admitting: Family Medicine

## 2021-11-09 NOTE — Telephone Encounter (Signed)
Copied from [place] [id number]. Topic: Medicare AWV >> Nov 09, 2021 12:55 PM Harris-Coley, Hannah Beat wrote: Reason for CRM: Left message for patient to schedule Annual Wellness Visit.  Please schedule with Nurse Health Advisor Charlott Rakes, RN at [facility].  Please call [phone] ask for [facility]

## 2022-01-13 ENCOUNTER — Telehealth: Payer: Self-pay | Admitting: Family Medicine

## 2022-01-13 DIAGNOSIS — R42 Dizziness and giddiness: Secondary | ICD-10-CM

## 2022-01-13 NOTE — Telephone Encounter (Signed)
Referral has been placed, will call pt once this has been submitted by Lattie Haw.

## 2022-01-13 NOTE — Telephone Encounter (Signed)
Patient states she just moved to [facility] and found they offer PT. Patient is requesting a verbal order from PCP in order to participate in PT for her hand. States the physical therapist she is interested in at the site is [facility]. The fax number provided is [phone]. Patient also requests a callback at [phone] once completed.

## 2022-03-13 ENCOUNTER — Encounter: Payer: Self-pay | Admitting: *Deleted

## 2022-04-17 ENCOUNTER — Telehealth (INDEPENDENT_AMBULATORY_CARE_PROVIDER_SITE_OTHER): Payer: Medicare Other | Admitting: Family Medicine

## 2022-04-17 ENCOUNTER — Encounter: Payer: Self-pay | Admitting: Family Medicine

## 2022-04-17 VITALS — Ht 62.0 in | Wt 160.0 lb

## 2022-04-17 DIAGNOSIS — U071 COVID-19: Secondary | ICD-10-CM

## 2022-04-17 DIAGNOSIS — E785 Hyperlipidemia, unspecified: Secondary | ICD-10-CM

## 2022-04-17 MED ORDER — NIRMATRELVIR/RITONAVIR (PAXLOVID)TABLET: 3.0000 | ORAL_TABLET | Freq: Two times a day (BID) | ORAL | 0 refills | Status: AC

## 2022-04-17 NOTE — Patient Instructions (Signed)
Health Maintenance Due  Topic Date Due   Zoster Vaccines- Shingrix (1 of 2) Never done   Medicare Annual Wellness (AWV)  01/01/2018   TETANUS/TDAP  01/19/2021   COVID-19 Vaccine (5 - Pfizer risk series) 03/02/2021   INFLUENZA VACCINE  01/17/2022    Recommended follow up: No follow-ups on file.

## 2022-04-17 NOTE — Progress Notes (Signed)
Phone [phone] Virtual visit via Video note   Subjective:  Chief complaint: Chief Complaint  Abigail Shah presents with   Covid Positive    Pt states she tested positive yesterday she c/o sneezing and cough with fever on Saturday that was 102.    This visit type was conducted due to national recommendations for restrictions regarding the COVID-19 Pandemic (e.g. social distancing).  This format is felt to be most appropriate for this Abigail Shah at this time balancing risks to Abigail Shah and risks to population by having him in for in person visit.  No physical exam was performed (except for noted visual exam or audio findings with Telehealth visits).    Our team/I connected with Arnette Norris at 10:20 AM EDT by a video enabled telemedicine application (doxy.me or caregility through epic) and verified that I am speaking with the correct person using two identifiers.  Location Abigail Shah: Home[place] Location provider: [facility], office Persons participating in the virtual visit:  Abigail Shah  Our team/I discussed the limitations of evaluation and management by telemedicine and the availability of in person appointments. In light of current covid-19 pandemic, Abigail Shah also understands that we are trying to protect them by minimizing in office contact if at all possible.  The Abigail Shah expressed consent for telemedicine visit and agreed to proceed. Abigail Shah understands insurance will be billed.   Past Medical History-  Abigail Shah Active Problem List   Diagnosis Date Noted   Osteoporosis 08/12/2007    Priority: Medium    Hyperlipidemia 07/04/2007    Priority: Medium    THALASSEMIA NEC 02/15/2007    Priority: Medium    Postmenopausal bleeding 10/23/2014    Priority: Low   Leukopenia 05/20/2014    Priority: Low   SKIN CANCER, HX OF 07/03/2007    Priority: Low    Medications- reviewed and updated Current Outpatient Medications  Medication Sig Dispense Refill   atorvastatin (LIPITOR) 10 MG tablet Take 1  tablet (10 mg total) by mouth daily. 90 tablet 3   Calcium Carbonate-Vitamin D (CALCIUM + D PO) Take by mouth.     Calcium Carbonate-Vitamin D 500-3.125 MG-MCG TABS Take by mouth.     Cyanocobalamin (B-12) 1000 MCG SUBL Place under the tongue.     No current facility-administered medications for this visit.     Objective:  Ht '5\' 2"'$  (1.575 m)   Wt 160 lb (72.6 kg)   LMP  (LMP Unknown)   BMI 29.26 kg/m  self reported vitals Gen: NAD, resting comfortably Lungs: nonlabored, normal respiratory rate  Skin: appears dry, no obvious rash     Assessment and Plan   # Covid 19 S:traveled to [place]- got home Thursday night and started getting sick that night.  Mucinex helps some. Did have fever up to Saturday of 102 but none since that time. Was having more sneezing and cough but improving. No shortness of breath  A/P: Abigail Shah with testing confirming covid 19 with first day of covid 19 symptoms on 04/13/22 . Tested positive 04/16/22. Was having sneezing fits and cough but now better. Had temperature on Saturday and now doing better.  Vaccination status: up to date including most recent immunization on 03/24/22- team will log Immunization History  Administered Date(s) Administered   DTaP 01/20/2011   Fluad Quad(high Dose 65+) 04/16/2020   Hepatitis A 04/27/2006, 11/01/2006   Hepatitis B 04/27/2006, 05/28/2006, 11/01/2006   Influenza Split 03/27/2011, 04/03/2012   Influenza Whole 03/19/2006, 03/17/2009   Influenza, High Dose Seasonal PF 02/20/2018, 02/26/2019  Influenza,inj,Quad PF,6+ Mos 04/14/2013   Influenza-Unspecified 04/02/2015, 04/17/2017, 04/16/2021   Meningococcal Polysaccharide 04/12/2006   PFIZER Comirnaty(Gray Top)Covid-19 Tri-Sucrose Vaccine 01/05/2021, 03/24/2022   PFIZER(Purple Top)SARS-COV-2 Vaccination 07/06/2019, 07/26/2019, 03/26/2020   PPD Test 05/06/2012   Pneumococcal Conjugate-13 05/20/2014   Pneumococcal Polysaccharide-23 03/19/2004   Td 03/19/2006   Zoster, Live  04/12/2006    Therefore: - recommended Abigail Shah watch closely for shortness of breath or confusion or worsening symptoms and if those occur Abigail Shah should contact us immediately or seek care in the emergency department -recommended Abigail Shah consider purchasing pulse oximeter and if levels 94% or below persistently- seek care at the hospital - Abigail Shah needs to self isolate  for at least 5 days since first symptom AND at least 24 hours fever free without fever reducing medications AND have improvement in respiratory symptoms . After 5 days can end self isolation but still needs to wear mask for additional 5 days.  -Abigail Shah should inform close contacts about exposure (anyone Abigail Shah been around unmasked for more than 15 minutes)   If High risk for complications-we discussed outpatient therapeutic options including paxlovid (and risk of rebound), molnupiravir, MAB infusion - Abigail Shah opted for paxlovid  #hyperlipidemia S: Medication:atorvastatin 10 mg took last night  Lab Results  Component Value Date   CHOL 198 02/24/2021   HDL 59.40 02/24/2021   LDLCALC 108 (H) 02/24/2021   LDLDIRECT 179.8 05/06/2012   TRIG 154.0 (H) 02/24/2021   CHOLHDL 3 02/24/2021   A/P: mild poor control but ok for her age- continue current meds but - will need to hold statin for 24 hours before oaxlovid (first dose tonight) and at least 24 hours after   Recommended follow up: keep next visit or sooner if needed Future Appointments  Date Time Provider [place]  04/25/2022  8:20 AM Marin Olp, MD [facility]   Lab/Order associations:   ICD-10-CM   1. COVID-19  U07.1     2. Hyperlipidemia, unspecified hyperlipidemia type  E78.5      Meds ordered this encounter  Medications   nirmatrelvir/ritonavir EUA (PAXLOVID) 20 x 150 MG & 10 x '100MG'$  TABS    Sig: Take 3 tablets by mouth 2 (two) times daily for 5 days. (Take nirmatrelvir 150 mg two tablets twice daily for 5 days and ritonavir 100 mg one tablet  twice daily for 5 days) Abigail Shah GFR is >60    Dispense:  30 tablet    Refill:  0   Return precautions advised.  Garret Reddish, MD

## 2022-04-25 ENCOUNTER — Ambulatory Visit (INDEPENDENT_AMBULATORY_CARE_PROVIDER_SITE_OTHER): Payer: Medicare Other | Admitting: Family Medicine

## 2022-04-25 ENCOUNTER — Encounter: Payer: Self-pay | Admitting: Family Medicine

## 2022-04-25 VITALS — BP 130/62 | HR 82 | Temp 97.4°F | Ht 62.0 in | Wt 161.2 lb

## 2022-04-25 DIAGNOSIS — E785 Hyperlipidemia, unspecified: Secondary | ICD-10-CM

## 2022-04-25 DIAGNOSIS — M810 Age-related osteoporosis without current pathological fracture: Secondary | ICD-10-CM | POA: Diagnosis not present

## 2022-04-25 DIAGNOSIS — Z0000 Encounter for general adult medical examination without abnormal findings: Secondary | ICD-10-CM | POA: Diagnosis not present

## 2022-04-25 DIAGNOSIS — D568 Other thalassemias: Secondary | ICD-10-CM | POA: Diagnosis not present

## 2022-04-25 LAB — LIPID PANEL
Cholesterol: 225 mg/dL — ABNORMAL HIGH (ref 0–200)
HDL: 44.8 mg/dL (ref >39.00)
NonHDL: 179.79
Total CHOL/HDL Ratio: 5
Triglycerides: 231 mg/dL — ABNORMAL HIGH (ref 0.0–149.0)
VLDL: 46.2 mg/dL — ABNORMAL HIGH (ref 0.0–40.0)

## 2022-04-25 LAB — COMPREHENSIVE METABOLIC PANEL
ALT: 25 U/L (ref 0–35)
AST: 22 U/L (ref 0–37)
Albumin: 4.2 g/dL (ref 3.5–5.2)
Alkaline Phosphatase: 61 U/L (ref 39–117)
BUN: 20 mg/dL (ref 6–23)
CO2: 29 mEq/L (ref 19–32)
Calcium: 9.2 mg/dL (ref 8.4–10.5)
Chloride: 102 mEq/L (ref 96–112)
Creatinine, Ser: 0.83 mg/dL (ref 0.40–1.20)
GFR: 65.03 mL/min (ref >60.00)
Glucose, Bld: 96 mg/dL (ref 70–99)
Potassium: 4.3 mEq/L (ref 3.5–5.1)
Sodium: 138 mEq/L (ref 135–145)
Total Bilirubin: 0.5 mg/dL (ref 0.2–1.2)
Total Protein: 6.4 g/dL (ref 6.0–8.3)

## 2022-04-25 LAB — VITAMIN D 25 HYDROXY (VIT D DEFICIENCY, FRACTURES): VITD: 28.77 ng/mL — ABNORMAL LOW (ref 30.00–100.00)

## 2022-04-25 LAB — CBC WITH DIFFERENTIAL/PLATELET
Basophils Absolute: 0.1 10*3/uL (ref 0.0–0.1)
Basophils Relative: 1.2 % (ref 0.0–3.0)
Eosinophils Absolute: 0.1 10*3/uL (ref 0.0–0.7)
Eosinophils Relative: 1.4 % (ref 0.0–5.0)
HCT: 34.7 % — ABNORMAL LOW (ref 36.0–46.0)
Hemoglobin: 11 g/dL — ABNORMAL LOW (ref 12.0–15.0)
Lymphocytes Relative: 42 % (ref 12.0–46.0)
Lymphs Abs: 2.4 10*3/uL (ref 0.7–4.0)
MCHC: 31.8 g/dL (ref 30.0–36.0)
MCV: 64.5 fl — ABNORMAL LOW (ref 78.0–100.0)
Monocytes Absolute: 0.6 10*3/uL (ref 0.1–1.0)
Monocytes Relative: 9.6 % (ref 3.0–12.0)
Neutro Abs: 2.6 10*3/uL (ref 1.4–7.7)
Neutrophils Relative %: 45.8 % (ref 43.0–77.0)
Platelets: 268 10*3/uL (ref 150.0–400.0)
RBC: 5.38 Mil/uL — ABNORMAL HIGH (ref 3.87–5.11)
RDW: 15.3 % (ref 11.5–15.5)
WBC: 5.7 10*3/uL (ref 4.0–10.5)

## 2022-04-25 LAB — LDL CHOLESTEROL, DIRECT: Direct LDL: 134 mg/dL

## 2022-04-25 NOTE — Progress Notes (Signed)
Phone [phone]   Subjective:  Patient presents today for their annual physical. Chief complaint-noted.   See problem oriented charting- ROS- full  review of systems was completed and negative except for: fatigue post covid  The following were reviewed and entered/updated in epic: Past Medical History:  Diagnosis Date   Anemia    Atypical nevus 05/21/2003   Right Fifth Toe-Slight   Atypical nevus 11/27/2012   Right Hip-Moderate   Atypical nevus 12/27/2016   Left Neck-Moderate(widershave)   Atypical nevus x 2 10/21/2001   Left Lower Back-Slight to Moderate and Right Post Upper Arm- Slight   BCC (basal cell carcinoma of skin) 10/21/2001   Left Side Scalp   BCC (basal cell carcinoma of skin) 11/23/2009   Left Forehead(curet and cautery)   BCC (basal cell carcinoma of skin) 08/01/2011   Left Upper Lip(curet)   Hyperlipidemia    Osteopenia    Skin cancer    hx of scalp   Thalassemia    Patient Active Problem List   Diagnosis Date Noted   Osteoporosis 08/12/2007    Priority: Medium    Hyperlipidemia 07/04/2007    Priority: Medium    THALASSEMIA NEC 02/15/2007    Priority: Medium    Postmenopausal bleeding 10/23/2014    Priority: Low   Leukopenia 05/20/2014    Priority: Low   SKIN CANCER, HX OF 07/03/2007    Priority: Low   Past Surgical History:  Procedure Laterality Date   APPENDECTOMY     BUNIONECTOMY     complicated with fractured toe x 2 feet   CATARACT EXTRACTION     bilateral   CESAREAN SECTION     x2   COLONOSCOPY  2003   DILATION AND CURETTAGE OF UTERUS     miscarriage     x2   skin cancer scalp     TONSILLECTOMY AND ADENOIDECTOMY     TUBAL LIGATION     vertigo      Family History  Problem Relation Age of Onset   Dementia Mother    Hyperlipidemia Father    Heart attack Father 33   Multiple sclerosis Brother        actually radiation poisoning   Hyperlipidemia Other    Stroke Other    Breast cancer Neg Hx     Medications- reviewed  and updated Current Outpatient Medications  Medication Sig Dispense Refill   atorvastatin (LIPITOR) 10 MG tablet Take 1 tablet (10 mg total) by mouth daily. 90 tablet 3   Calcium Carbonate-Vitamin D (CALCIUM + D PO) Take by mouth.     Calcium Carbonate-Vitamin D 500-3.125 MG-MCG TABS Take by mouth.     Cyanocobalamin (B-12) 1000 MCG SUBL Place under the tongue.     No current facility-administered medications for this visit.    Allergies-reviewed and updated No Known Allergies  Social History   Social History Narrative   Lives alone. Widowed 1987. 2 children. 4 grandkids. No greatgrandkids.    Lived in [place] since going to [employer] college (livesd away for a few years)      Retired from Audiological scientist. Still doing a lot.       Hobbies: a lot of travel because son lives overseas mostly, daughter in [place].    Same church since 1962.       Advanced directives. HCPOA- both children. DNR   Objective  Objective:  BP 130/62   Pulse 82   Temp (!) 97.4 F (36.3 C)   Ht  $'5\' 2"'n$  (1.575 m)   Wt 161 lb 3.2 oz (73.1 kg)   LMP  (LMP Unknown)   SpO2 95%   BMI 29.48 kg/m  Gen: NAD, resting comfortably  HEENT: Mucous membranes are moist. Oropharynx normal Neck: no thyromegaly CV: RRR no murmurs rubs or gallops Lungs: CTAB no crackles, wheeze, rhonchi Abdomen: soft/nontender/nondistended/normal bowel sounds. No rebound or guarding.  Ext: minimal edema L >R  Skin: warm, dry Neuro: grossly normal, moves all extremities, PERRLA   Assessment and Plan   81 y.o. female presenting for annual physical.  Health Maintenance counseling: 1. Anticipatory guidance: Patient counseled regarding regular dental exams -q6 months, eye exams - yearly,  avoiding smoking and second hand smoke , limiting alcohol to 1 beverage per day- very rare , no illicit drugs .   2. Risk factor reduction:  Advised patient of need for regular exercise and diet rich and fruits and vegetables to reduce risk  of heart attack and stroke.  Exercise- tries to walk to dining room 3-4 x a day and has exercise available 6 days that wants to start.  Diet/weight management-harder to eat at [employer]- one good meal a day- wonders if hgb down as a result of change in nutrition- discussed trying to add at least one more good meal with protein a day. Within 6 lbs of last year - recent travels contributed  Wt Readings from Last 3 Encounters:  04/25/22 161 lb 3.2 oz (73.1 kg)  04/17/22 160 lb (72.6 kg)  04/20/21 155 lb 12.8 oz (70.7 kg)  3. Immunizations/screenings/ancillary studies-just had COVID despite recent vaccination but recovered well, discussed RSV, discussed Shingrix, also due for Tdap- but she think she had  Immunization History  Administered Date(s) Administered   DTaP 01/20/2011   Fluad Quad(high Dose 65+) 04/16/2020   Hepatitis A 04/27/2006, 11/01/2006   Hepatitis B 04/27/2006, 05/28/2006, 11/01/2006   Influenza Split 03/27/2011, 04/03/2012   Influenza Whole 03/19/2006, 03/17/2009   Influenza, High Dose Seasonal PF 02/20/2018, 02/26/2019, 03/24/2022   Influenza,inj,Quad PF,6+ Mos 04/14/2013   Influenza-Unspecified 04/02/2015, 04/17/2017, 04/16/2021   Meningococcal Polysaccharide 04/12/2006   PFIZER Comirnaty(Gray Top)Covid-19 Tri-Sucrose Vaccine 01/05/2021, 03/24/2022   PFIZER(Purple Top)SARS-COV-2 Vaccination 07/06/2019, 07/26/2019, 03/26/2020   PPD Test 05/06/2012   Pneumococcal Conjugate-13 05/20/2014   Pneumococcal Polysaccharide-23 03/19/2004   Td 03/19/2006   Zoster, Live 04/12/2006  4. Cervical cancer screening-  past age based screening recommendations .  No vaginal discharge or bleeding today-no pelvic plan today-we opted out of pelvic exam 5. Breast cancer screening-patient discontinued screening in 2020-past age based screening recommendations   6. Colon cancer screening - colonoscopy 06/19/2001 -  past age based screening recommendations.  Normal stool cards in 2015 and 2017.  We  have opted to discon tinue screening.  Denies blood in stool. No melena  7. Skin cancer screening-follows with dermatology yearly .  advised regular sunscreen use. Denies worrisome, changing, or new skin lesions.  8. Birth control/STD check- not sexually active  9. Osteoporosis screening at 65-see below -Never smoker  Status of chronic or acute concerns   #Recent covid in october- lingering fatigue but has tested negative  #hyperlipidemia S: Medication:atorvastatin 10 mg  Lab Results  Component Value Date   CHOL 198 02/24/2021   HDL 59.40 02/24/2021   LDLCALC 108 (H) 02/24/2021   LDLDIRECT 179.8 05/06/2012   TRIG 154.0 (H) 02/24/2021   CHOLHDL 3 02/24/2021  A/P: reasonable control/improvement considering her age- unlikely to increase dose unless drastic worsening- particularly over age 1   #  Osteoporosis S: Last DEXA: 04/26/2021 largely stable despite being off Fosamax we opted for 2-year repeat  Medication (bisphosphonate or prolia): Fosamax May 2016 through 2021  Calcium: '1200mg'$  (through diet ok) recommended - yes Vitamin D: 1000 units a day recommended- yes but has missed some. Has needed boost in the past- 5k units recently- takes 2 at a time when does take -gets nice walks to the dining hall-about 10 minutes each time 3-5x a day A/P: suspect stable- update dexa next year likely   #Drooling-long-term issue worsened with Invisalign previously and then retainer- no worsening  #Thalassemia-mild anemia-we will monitor- she thinks culd be slightly worse -Has had leukopenia in the past but normal last year   #Dupuytren contracture on right hand-has seen Dr. Georgina Snell and Dr. Amedeo Plenty I believe in the past.  No surgery recommended-monitor for now - no recent issues/worsening- feels piano has helped   Recommended follow up: Return in about 1 year (around 04/26/2023) for physical or sooner if needed.Schedule b4 you leave.  Lab/Order associations: fasting   ICD-10-CM   1. Preventative  health care  Z00.00     2. Age-related osteoporosis without current pathological fracture  M81.0 Vitamin D (25 hydroxy)    3. Hyperlipidemia, unspecified hyperlipidemia type  E78.5 CBC with Differential/Platelet    Comprehensive metabolic panel    Lipid panel    4. Other thalassemia ([place]) Chronic D56.8      No orders of the defined types were placed in this encounter.  Return precautions advised.  Garret Reddish, MD

## 2022-04-25 NOTE — Patient Instructions (Addendum)
Let us know when you get your covid and tdap at the pharmacy.  Let us know date of first shingrix and also when you get the 2nd and which pharmacy for both- probably wait until you feel less fatigued  Check your records with pharmacy on Tdap or DTap- last we have on file is 2012 and past due if that is true  Please stop by lab before you go If you have mychart- we will send your results within 3 business days of [place] receiving them.  If you do not have mychart- we will call you about results within 5 business days of [place] receiving them.  *please also note that you will see labs on mychart as soon as they post. I will later go in and write notes on them- will say "notes from Dr. Yong Channel"   You are eligible to schedule your annual wellness visit with our nurse specialist Otila Kluver.  Please consider scheduling this before you leave today- could schedule a few months out to check in on health maintenance items and your weight loss goals  Recommended follow up: Return in about 1 year (around 04/26/2023) for physical or sooner if needed.Schedule b4 you leave.

## 2022-04-28 ENCOUNTER — Telehealth: Payer: Self-pay | Admitting: Family Medicine

## 2022-04-28 MED ORDER — ATORVASTATIN CALCIUM 10 MG PO TABS: 10.0000 mg | ORAL_TABLET | Freq: Every day | ORAL | 3 refills | Status: DC

## 2022-04-28 NOTE — Telephone Encounter (Signed)
    LAST APPOINTMENT DATE:  04/25/22 CPE   NEXT APPOINTMENT DATE: 04/27/23 CPE   MEDICATION: atorvastatin (LIPITOR) 10 MG tablet [[id number]]    Is the patient out of medication?  No   PHARMACY: [facility] at  [place], [place] [address] [address]: [employer] [facility] [place]

## 2022-04-28 NOTE — Telephone Encounter (Signed)
Rx refilled.

## 2022-05-01 ENCOUNTER — Telehealth: Payer: Self-pay | Admitting: Family Medicine

## 2022-05-01 NOTE — Telephone Encounter (Signed)
Patient called stating that she had not received her Atorvastatin. I advised that it was sent to [facility] on Friday. She will go to pharmacy to pick it up.

## 2022-10-28 DIAGNOSIS — M25512 Pain in left shoulder: Secondary | ICD-10-CM | POA: Diagnosis not present

## 2022-11-01 DIAGNOSIS — M25521 Pain in right elbow: Secondary | ICD-10-CM | POA: Diagnosis not present

## 2022-11-01 DIAGNOSIS — M6281 Muscle weakness (generalized): Secondary | ICD-10-CM | POA: Diagnosis not present

## 2022-11-03 DIAGNOSIS — M6281 Muscle weakness (generalized): Secondary | ICD-10-CM | POA: Diagnosis not present

## 2022-11-03 DIAGNOSIS — M25521 Pain in right elbow: Secondary | ICD-10-CM | POA: Diagnosis not present

## 2022-11-07 ENCOUNTER — Ambulatory Visit: Payer: Medicare Other | Admitting: Physician Assistant

## 2022-11-07 DIAGNOSIS — M6281 Muscle weakness (generalized): Secondary | ICD-10-CM | POA: Diagnosis not present

## 2022-11-07 DIAGNOSIS — M25521 Pain in right elbow: Secondary | ICD-10-CM | POA: Diagnosis not present

## 2022-11-10 DIAGNOSIS — M25521 Pain in right elbow: Secondary | ICD-10-CM | POA: Diagnosis not present

## 2022-11-10 DIAGNOSIS — M6281 Muscle weakness (generalized): Secondary | ICD-10-CM | POA: Diagnosis not present

## 2022-11-13 DIAGNOSIS — M25521 Pain in right elbow: Secondary | ICD-10-CM | POA: Diagnosis not present

## 2022-11-13 DIAGNOSIS — M6281 Muscle weakness (generalized): Secondary | ICD-10-CM | POA: Diagnosis not present

## 2022-11-17 DIAGNOSIS — M25521 Pain in right elbow: Secondary | ICD-10-CM | POA: Diagnosis not present

## 2022-11-17 DIAGNOSIS — M6281 Muscle weakness (generalized): Secondary | ICD-10-CM | POA: Diagnosis not present

## 2022-11-20 ENCOUNTER — Emergency Department (HOSPITAL_COMMUNITY): Payer: Medicare Other

## 2022-11-20 ENCOUNTER — Encounter (HOSPITAL_COMMUNITY): Payer: Self-pay

## 2022-11-20 ENCOUNTER — Other Ambulatory Visit: Payer: Self-pay

## 2022-11-20 ENCOUNTER — Emergency Department (HOSPITAL_COMMUNITY)
Admission: EM | Admit: 2022-11-20 | Discharge: 2022-11-21 | Disposition: A | Discharge status: Home / Self Care | Payer: Medicare Other | Attending: Emergency Medicine | Admitting: Emergency Medicine

## 2022-11-20 DIAGNOSIS — R110 Nausea: Secondary | ICD-10-CM | POA: Diagnosis not present

## 2022-11-20 DIAGNOSIS — R609 Edema, unspecified: Secondary | ICD-10-CM | POA: Diagnosis not present

## 2022-11-20 DIAGNOSIS — Y92099 Unspecified place in other non-institutional residence as the place of occurrence of the external cause: Secondary | ICD-10-CM | POA: Diagnosis not present

## 2022-11-20 DIAGNOSIS — W1839XA Other fall on same level, initial encounter: Secondary | ICD-10-CM | POA: Diagnosis not present

## 2022-11-20 DIAGNOSIS — S4990XA Unspecified injury of shoulder and upper arm, unspecified arm, initial encounter: Secondary | ICD-10-CM | POA: Diagnosis not present

## 2022-11-20 DIAGNOSIS — S42351A Displaced comminuted fracture of shaft of humerus, right arm, initial encounter for closed fracture: Secondary | ICD-10-CM | POA: Diagnosis not present

## 2022-11-20 DIAGNOSIS — S42291A Other displaced fracture of upper end of right humerus, initial encounter for closed fracture: Secondary | ICD-10-CM | POA: Diagnosis not present

## 2022-11-20 DIAGNOSIS — S42201A Unspecified fracture of upper end of right humerus, initial encounter for closed fracture: Secondary | ICD-10-CM | POA: Diagnosis not present

## 2022-11-20 DIAGNOSIS — M25511 Pain in right shoulder: Secondary | ICD-10-CM | POA: Diagnosis present

## 2022-11-20 DIAGNOSIS — W19XXXA Unspecified fall, initial encounter: Secondary | ICD-10-CM | POA: Diagnosis not present

## 2022-11-20 DIAGNOSIS — S42251A Displaced fracture of greater tuberosity of right humerus, initial encounter for closed fracture: Secondary | ICD-10-CM | POA: Diagnosis not present

## 2022-11-20 LAB — BASIC METABOLIC PANEL
Anion gap: 10 (ref 5–15)
BUN: 21 mg/dL (ref 8–23)
CO2: 24 mmol/L (ref 22–32)
Calcium: 9.1 mg/dL (ref 8.9–10.3)
Chloride: 96 mmol/L — ABNORMAL LOW (ref 98–111)
Creatinine, Ser: 0.78 mg/dL (ref 0.44–1.00)
GFR, Estimated: >60 mL/min (ref >60)
Glucose, Bld: 126 mg/dL — ABNORMAL HIGH (ref 70–99)
Potassium: 4.1 mmol/L (ref 3.5–5.1)
Sodium: 130 mmol/L — ABNORMAL LOW (ref 135–145)

## 2022-11-20 LAB — CBC WITH DIFFERENTIAL/PLATELET
Abs Immature Granulocytes: 0.02 10*3/uL (ref 0.00–0.07)
Basophils Absolute: 0.1 10*3/uL (ref 0.0–0.1)
Basophils Relative: 1 %
Eosinophils Absolute: 0.1 10*3/uL (ref 0.0–0.5)
Eosinophils Relative: 1 %
HCT: 33.5 % — ABNORMAL LOW (ref 36.0–46.0)
Hemoglobin: 10.6 g/dL — ABNORMAL LOW (ref 12.0–15.0)
Immature Granulocytes: 0 %
Lymphocytes Relative: 17 %
Lymphs Abs: 1.3 10*3/uL (ref 0.7–4.0)
MCH: 20.7 pg — ABNORMAL LOW (ref 26.0–34.0)
MCHC: 31.6 g/dL (ref 30.0–36.0)
MCV: 65.6 fL — ABNORMAL LOW (ref 80.0–100.0)
Monocytes Absolute: 0.5 10*3/uL (ref 0.1–1.0)
Monocytes Relative: 7 %
Neutro Abs: 5.6 10*3/uL (ref 1.7–7.7)
Neutrophils Relative %: 74 %
Platelets: 197 10*3/uL (ref 150–400)
RBC: 5.11 MIL/uL (ref 3.87–5.11)
RDW: 16.2 % — ABNORMAL HIGH (ref 11.5–15.5)
WBC: 7.5 10*3/uL (ref 4.0–10.5)
nRBC: 0 % (ref 0.0–0.2)

## 2022-11-20 MED ORDER — IBUPROFEN 200 MG PO TABS
600.0000 mg | ORAL_TABLET | Freq: Four times a day (QID) | ORAL | Status: DC | PRN
  Administered 2022-11-21: 600 mg via ORAL
  Filled 2022-11-20: qty 3

## 2022-11-20 MED ORDER — MORPHINE SULFATE (PF) 4 MG/ML IV SOLN
4.0000 mg | Freq: Once | INTRAVENOUS | Status: AC
  Administered 2022-11-20: 4 mg via INTRAVENOUS
  Filled 2022-11-20: qty 1

## 2022-11-20 MED ORDER — ONDANSETRON HCL 4 MG/2ML IJ SOLN
4.0000 mg | Freq: Once | INTRAMUSCULAR | Status: AC
  Administered 2022-11-20: 4 mg via INTRAVENOUS
  Filled 2022-11-20: qty 2

## 2022-11-20 MED ORDER — ACETAMINOPHEN 325 MG PO TABS: 650.0000 mg | ORAL_TABLET | Freq: Four times a day (QID) | ORAL | Status: DC | PRN

## 2022-11-20 NOTE — ED Notes (Signed)
Assisted pt to the restroom in a wheelchair needed assistance pulling pants down and up

## 2022-11-20 NOTE — ED Provider Notes (Signed)
[facility] EMERGENCY DEPARTMENT AT [facility] Provider Note   CSN: [id number] Arrival date & time: 11/20/22  1925     History  Chief Complaint  Patient presents with   Arm Injury    Right upper arm    Abigail Shah is a 82 y.o. female presented to ED with a mechanical fall at home onto her right shoulder.  Now having pain and limited mobility in her right shoulder and right upper arm.  Denies striking her head.  Denies neck pain.  She is not on anticoagulation.  She reports the reason for her fall was that she sprained or injured her knee several days ago and has been wobbling giving out on her, and also slipped and give out on her again today.  She lives in independent living.  HPI     Home Medications Prior to Admission medications   Medication Sig Start Date End Date Taking? Authorizing Provider  atorvastatin (LIPITOR) 10 MG tablet Take 1 tablet (10 mg total) by mouth daily. Patient taking differently: Take 10 mg by mouth every evening. 04/28/22  Yes Shelva Majestic, MD  Calcium Carbonate-Vitamin D 500-3.125 MG-MCG TABS Take by mouth.   Yes [provider]  Cyanocobalamin (B-12) 1000 MCG SUBL Place under the tongue.   Yes [provider]      Allergies    Patient has no known allergies.    Review of Systems   Review of Systems  Physical Exam Updated Vital Signs BP (!) 151/75 (BP Location: Left Arm)   Pulse 81   Temp 98.1 F (36.7 C) (Oral)   Resp 18   Ht 5\' 3"  (1.6 m)   Wt 69.4 kg   LMP  (LMP Unknown)   BMI 27.10 kg/m  Physical Exam Constitutional:      General: She is not in acute distress. HENT:     Head: Normocephalic and atraumatic.  Eyes:     Conjunctiva/sclera: Conjunctivae normal.     Pupils: Pupils are equal, round, and reactive to light.  Cardiovascular:     Rate and Rhythm: Normal rate and regular rhythm.  Pulmonary:     Effort: Pulmonary effort is normal. No respiratory distress.  Abdominal:     General:  There is no distension.     Tenderness: There is no abdominal tenderness.  Musculoskeletal:     Cervical back: Normal range of motion and neck supple.     Comments: Tenderness mild edema of the right proximal arm near the humeral head, no visible deformity, patient is unable to lift arm due to pain.  Distal right arm and remaining extremities and pelvis unremarkable on exam.  Clavicles without any tenderness.  Skin:    General: Skin is warm and dry.  Neurological:     General: No focal deficit present.     Mental Status: She is alert and oriented to person, place, and time. Mental status is at baseline.  Psychiatric:        Mood and Affect: Mood normal.        Behavior: Behavior normal.     ED Results / Procedures / Treatments   Labs (all labs ordered are listed, but only abnormal results are displayed) Labs Reviewed  BASIC METABOLIC PANEL - Abnormal; Notable for the following components:      Result Value   Sodium 130 (*)    Chloride 96 (*)    Glucose, Bld 126 (*)    All other components within normal limits  CBC WITH DIFFERENTIAL/PLATELET - Abnormal; Notable for the following components:   Hemoglobin 10.6 (*)    HCT 33.5 (*)    MCV 65.6 (*)    MCH 20.7 (*)    RDW 16.2 (*)    All other components within normal limits    EKG None  Radiology DG Humerus Right  Result Date: 11/20/2022 CLINICAL DATA:  Fall onto shoulder, suspect humeral fracture. EXAM: RIGHT HUMERUS - 2+ VIEW; RIGHT SHOULDER - 1 VIEW COMPARISON:  None Available. FINDINGS: There is a comminuted displaced fracture of the humeral neck and greater tuberosity. No dislocation is seen. Hypertrophic degenerative changes are present at the acromioclavicular joint. The soft tissues are unremarkable. IMPRESSION: Mildly displaced comminuted fracture involving the humeral neck and greater tuberosity. No dislocation. Electronically Signed   By: Thornell Sartorius M.D.   On: 11/20/2022 20:52   DG Shoulder Right Portable  Result  Date: 11/20/2022 CLINICAL DATA:  Fall onto shoulder, suspect humeral fracture. EXAM: RIGHT HUMERUS - 2+ VIEW; RIGHT SHOULDER - 1 VIEW COMPARISON:  None Available. FINDINGS: There is a comminuted displaced fracture of the humeral neck and greater tuberosity. No dislocation is seen. Hypertrophic degenerative changes are present at the acromioclavicular joint. The soft tissues are unremarkable. IMPRESSION: Mildly displaced comminuted fracture involving the humeral neck and greater tuberosity. No dislocation. Electronically Signed   By: Thornell Sartorius M.D.   On: 11/20/2022 20:52    Procedures Procedures    Medications Ordered in ED Medications  ibuprofen (ADVIL) tablet 600 mg (has no administration in time range)  acetaminophen (TYLENOL) tablet 650 mg (has no administration in time range)  morphine (PF) 4 MG/ML injection 4 mg (4 mg Intravenous Given 11/20/22 2039)    ED Course/ Medical Decision Making/ A&P                             Medical Decision Making Amount and/or Complexity of Data Reviewed Labs: ordered. Radiology: ordered.  Risk OTC drugs. Prescription drug management.   Patient is here with mechanical fall and isolated injury to the right shoulder.  X-ray was ordered and personally viewed interpreted, showing proximal right humeral fracture.  No other emergent findings in the patient's blood work.  No indication for further trauma imaging based on this presentation.  The patient was placed in a shoulder sling.  IV pain medication given.  She will need outpatient follow-up with an orthopedic doctor and is requesting [facility] whom she has seen in the past.  At this time, however, she is not steady enough on her feet, likely due to the morphine medication, and I am concerned that she may be a higher fall risk as she lives by herself and has no immediate family available to pick her up.  However, was able to speak to her daughter Merlene Laughter, who lives in state but is at some  distance now, and we are planning to have a family member come tomorrow morning to pick up the patient.  This will either be the patient's niece or potentially her brother who lives in [place].  Merlene Laughter is making calls to arrange for this.  They will need to stay with the patient at least the next 24 hours and ensure the patient is stable.  The patient typically does not need assistance with walking but would benefit from using a cane in her left arm.  The patient herself is very much wanting to go home, but understands that she may  need additional support the next few days, and will be waiting for the family.        Final Clinical Impression(s) / ED Diagnoses Final diagnoses:  Closed fracture of proximal end of right humerus, unspecified fracture morphology, initial encounter    Rx / DC Orders ED Discharge Orders     None         Terald Sleeper, MD 11/20/22 2222

## 2022-11-20 NOTE — ED Triage Notes (Signed)
[facility] on [address]. Mechanical fall, right arm upper/shoulder. 1hr ago. Right knee pain from previous fall.

## 2022-11-21 ENCOUNTER — Telehealth: Payer: Self-pay | Admitting: Family Medicine

## 2022-11-21 DIAGNOSIS — M25561 Pain in right knee: Secondary | ICD-10-CM | POA: Diagnosis not present

## 2022-11-21 DIAGNOSIS — S42201A Unspecified fracture of upper end of right humerus, initial encounter for closed fracture: Secondary | ICD-10-CM | POA: Diagnosis not present

## 2022-11-21 DIAGNOSIS — M25512 Pain in left shoulder: Secondary | ICD-10-CM | POA: Diagnosis not present

## 2022-11-21 MED ORDER — OXYCODONE HCL 5 MG PO TABS: 5.0000 mg | ORAL_TABLET | ORAL | 0 refills | Status: DC | PRN

## 2022-11-21 MED ORDER — NAPROXEN 375 MG PO TABS: 375.0000 mg | ORAL_TABLET | Freq: Two times a day (BID) | ORAL | 0 refills | Status: DC

## 2022-11-21 NOTE — Telephone Encounter (Signed)
Form has been given to [place] to fax.

## 2022-11-21 NOTE — ED Notes (Signed)
Assisted pt to the restroom in a wheelchair, assisted with pulling her pants down then up RN present

## 2022-11-21 NOTE — ED Notes (Signed)
Assisted pt to the restroom in a wheelchair, assisted with pulling pants down then up after use RN present

## 2022-11-21 NOTE — Telephone Encounter (Signed)
Caller states: -Patient fell yesterday, set for hospital d/c today  - States without FL2 form signed they will send her home instead of to the care facility.  - Patient is not in a place to care for herself   - Form needs to be signed only by 11:30am today if possible   Currently awaiting form via fax.

## 2022-11-22 DIAGNOSIS — S42201A Unspecified fracture of upper end of right humerus, initial encounter for closed fracture: Secondary | ICD-10-CM | POA: Diagnosis not present

## 2022-11-22 DIAGNOSIS — R2689 Other abnormalities of gait and mobility: Secondary | ICD-10-CM | POA: Diagnosis not present

## 2022-11-22 DIAGNOSIS — M6281 Muscle weakness (generalized): Secondary | ICD-10-CM | POA: Diagnosis not present

## 2022-11-22 DIAGNOSIS — E785 Hyperlipidemia, unspecified: Secondary | ICD-10-CM | POA: Diagnosis not present

## 2022-11-22 DIAGNOSIS — S42201D Unspecified fracture of upper end of right humerus, subsequent encounter for fracture with routine healing: Secondary | ICD-10-CM | POA: Diagnosis not present

## 2022-11-23 ENCOUNTER — Telehealth: Payer: Self-pay

## 2022-11-23 DIAGNOSIS — R112 Nausea with vomiting, unspecified: Secondary | ICD-10-CM | POA: Diagnosis not present

## 2022-11-23 NOTE — Transitions of Care (Post Inpatient/ED Visit) (Signed)
   11/23/2022  Name: Abigail Shah MRN: [id number] DOB: 13-Aug-1940  Today's TOC FU Call Status: Today's TOC FU Call Status:: Successful TOC FU Call Competed TOC FU Call Complete Date: 11/23/22  Red on EMMI-ED Discharge Alert Date & Reason:11/22/22 "Scheduled follow-up appt? No"   Transition Care Management Follow-up Telephone Call Date of Discharge: 11/21/22 Discharge Facility: [facility]) Type of Discharge: Emergency Department Reason for ED Visit: Other: ("closed fx of proximal end of right humerus") How have you been since you were released from the hospital?: Same (Pt reports she is having some n&V since yest. States staff has given her something for it. Pt reports she left ED and returned to [facility] assisted part and unsure if she will be able to return to independent  living.Call deferred accordingly.) Any questions or concerns?: No  Items Reviewed: Did you receive and understand the discharge instructions provided?: No Medications obtained,verified, and reconciled?: No Medications Not Reviewed Reasons:: Other: (pt resdient at facility)  Medications Reviewed Today: Medications Reviewed Today     Reviewed by Patience Musca, CPhT (Pharmacy Technician) on 11/20/22 at 2152  Med List Status: Complete   Medication Order Taking? Sig Documenting Provider Last Dose Status Informant  atorvastatin (LIPITOR) 10 MG tablet [id number] Yes Take 1 tablet (10 mg total) by mouth daily.  Patient taking differently: Take 10 mg by mouth every evening.   Shelva Majestic, MD 11/19/2022 Active Self, Pharmacy Records  Calcium Carbonate-Vitamin D 500-3.125 MG-MCG TABS [id number] Yes Take by mouth. [provider] 11/19/2022 Active Self, Pharmacy Records  Cyanocobalamin (B-12) 1000 MCG SUBL [id number] Yes Place under the tongue. [provider] 11/19/2022 Active Self, Pharmacy Records            Home Care and Equipment/Supplies:    Functional Questionnaire:     Follow up appointments reviewed:    SDOH Interventions Today    Flowsheet Row Most Recent Value  SDOH Interventions   Food Insecurity Interventions Intervention Not Indicated  Transportation Interventions Intervention Not Indicated      TOC Interventions Today    Flowsheet Row Most Recent Value  TOC Interventions   TOC Interventions Discussed/Reviewed TOC Interventions Discussed, Post discharge activity limitations per provider      Interventions Today    Flowsheet Row Most Recent Value  General Interventions   General Interventions Discussed/Reviewed General Interventions Discussed, Doctor Visits  Doctor Visits Discussed/Reviewed Doctor Visits Discussed, Specialist, PCP  PCP/Specialist Visits Compliance with follow-up visit  Education Interventions   Education Provided Provided Education  Provided Verbal Education On Nutrition, When to see the doctor, Medication  Nutrition Interventions   Nutrition Discussed/Reviewed Nutrition Discussed  Pharmacy Interventions   Pharmacy Dicussed/Reviewed Pharmacy Topics Discussed  Safety Interventions   Safety Discussed/Reviewed Safety Discussed        [facility] Care Management Care Management Community Coordinator Direct Phone: [phone] Toll Free: [phone] Fax: [phone]

## 2022-11-24 ENCOUNTER — Inpatient Hospital Stay (HOSPITAL_COMMUNITY)
Admission: EM | Admit: 2022-11-24 | Discharge: 2022-11-27 | DRG: 522 | Disposition: A | Discharge status: Skilled Nursing Facility | Payer: Medicare Other | Attending: Internal Medicine | Admitting: Internal Medicine

## 2022-11-24 ENCOUNTER — Emergency Department (HOSPITAL_COMMUNITY): Payer: Medicare Other

## 2022-11-24 ENCOUNTER — Encounter (HOSPITAL_COMMUNITY): Payer: Self-pay | Admitting: Emergency Medicine

## 2022-11-24 ENCOUNTER — Ambulatory Visit: Payer: Self-pay | Admitting: Student

## 2022-11-24 ENCOUNTER — Inpatient Hospital Stay (HOSPITAL_COMMUNITY): Payer: Medicare Other

## 2022-11-24 ENCOUNTER — Other Ambulatory Visit: Payer: Self-pay

## 2022-11-24 DIAGNOSIS — Z85828 Personal history of other malignant neoplasm of skin: Secondary | ICD-10-CM | POA: Diagnosis not present

## 2022-11-24 DIAGNOSIS — S42201D Unspecified fracture of upper end of right humerus, subsequent encounter for fracture with routine healing: Secondary | ICD-10-CM | POA: Diagnosis not present

## 2022-11-24 DIAGNOSIS — R6889 Other general symptoms and signs: Secondary | ICD-10-CM | POA: Diagnosis not present

## 2022-11-24 DIAGNOSIS — W1789XA Other fall from one level to another, initial encounter: Secondary | ICD-10-CM | POA: Diagnosis present

## 2022-11-24 DIAGNOSIS — Z471 Aftercare following joint replacement surgery: Secondary | ICD-10-CM | POA: Diagnosis not present

## 2022-11-24 DIAGNOSIS — R9431 Abnormal electrocardiogram [ECG] [EKG]: Secondary | ICD-10-CM | POA: Diagnosis not present

## 2022-11-24 DIAGNOSIS — E7800 Pure hypercholesterolemia, unspecified: Secondary | ICD-10-CM | POA: Diagnosis not present

## 2022-11-24 DIAGNOSIS — I10 Essential (primary) hypertension: Secondary | ICD-10-CM | POA: Diagnosis not present

## 2022-11-24 DIAGNOSIS — Z83438 Family history of other disorder of lipoprotein metabolism and other lipidemia: Secondary | ICD-10-CM

## 2022-11-24 DIAGNOSIS — Z8249 Family history of ischemic heart disease and other diseases of the circulatory system: Secondary | ICD-10-CM | POA: Diagnosis not present

## 2022-11-24 DIAGNOSIS — Z823 Family history of stroke: Secondary | ICD-10-CM

## 2022-11-24 DIAGNOSIS — S72001A Fracture of unspecified part of neck of right femur, initial encounter for closed fracture: Secondary | ICD-10-CM | POA: Diagnosis not present

## 2022-11-24 DIAGNOSIS — R296 Repeated falls: Secondary | ICD-10-CM | POA: Diagnosis not present

## 2022-11-24 DIAGNOSIS — E559 Vitamin D deficiency, unspecified: Secondary | ICD-10-CM | POA: Diagnosis not present

## 2022-11-24 DIAGNOSIS — E861 Hypovolemia: Secondary | ICD-10-CM | POA: Diagnosis not present

## 2022-11-24 DIAGNOSIS — Z96641 Presence of right artificial hip joint: Secondary | ICD-10-CM | POA: Diagnosis not present

## 2022-11-24 DIAGNOSIS — E871 Hypo-osmolality and hyponatremia: Secondary | ICD-10-CM | POA: Diagnosis present

## 2022-11-24 DIAGNOSIS — M8000XA Age-related osteoporosis with current pathological fracture, unspecified site, initial encounter for fracture: Secondary | ICD-10-CM | POA: Diagnosis not present

## 2022-11-24 DIAGNOSIS — Z743 Need for continuous supervision: Secondary | ICD-10-CM | POA: Diagnosis not present

## 2022-11-24 DIAGNOSIS — Z7401 Bed confinement status: Secondary | ICD-10-CM | POA: Diagnosis not present

## 2022-11-24 DIAGNOSIS — S72011A Unspecified intracapsular fracture of right femur, initial encounter for closed fracture: Secondary | ICD-10-CM | POA: Diagnosis not present

## 2022-11-24 DIAGNOSIS — R112 Nausea with vomiting, unspecified: Secondary | ICD-10-CM | POA: Diagnosis not present

## 2022-11-24 DIAGNOSIS — Z9841 Cataract extraction status, right eye: Secondary | ICD-10-CM

## 2022-11-24 DIAGNOSIS — R600 Localized edema: Secondary | ICD-10-CM | POA: Diagnosis not present

## 2022-11-24 DIAGNOSIS — D62 Acute posthemorrhagic anemia: Secondary | ICD-10-CM | POA: Diagnosis not present

## 2022-11-24 DIAGNOSIS — S72031A Displaced midcervical fracture of right femur, initial encounter for closed fracture: Secondary | ICD-10-CM | POA: Diagnosis not present

## 2022-11-24 DIAGNOSIS — Z9842 Cataract extraction status, left eye: Secondary | ICD-10-CM

## 2022-11-24 DIAGNOSIS — R0902 Hypoxemia: Secondary | ICD-10-CM | POA: Diagnosis not present

## 2022-11-24 DIAGNOSIS — S42201A Unspecified fracture of upper end of right humerus, initial encounter for closed fracture: Secondary | ICD-10-CM | POA: Diagnosis not present

## 2022-11-24 DIAGNOSIS — E785 Hyperlipidemia, unspecified: Secondary | ICD-10-CM | POA: Diagnosis present

## 2022-11-24 DIAGNOSIS — R278 Other lack of coordination: Secondary | ICD-10-CM | POA: Diagnosis not present

## 2022-11-24 DIAGNOSIS — M6281 Muscle weakness (generalized): Secondary | ICD-10-CM | POA: Diagnosis not present

## 2022-11-24 DIAGNOSIS — Z79899 Other long term (current) drug therapy: Secondary | ICD-10-CM

## 2022-11-24 DIAGNOSIS — R079 Chest pain, unspecified: Secondary | ICD-10-CM | POA: Diagnosis not present

## 2022-11-24 DIAGNOSIS — Z66 Do not resuscitate: Secondary | ICD-10-CM | POA: Diagnosis present

## 2022-11-24 DIAGNOSIS — W19XXXA Unspecified fall, initial encounter: Secondary | ICD-10-CM | POA: Diagnosis not present

## 2022-11-24 DIAGNOSIS — R1110 Vomiting, unspecified: Secondary | ICD-10-CM

## 2022-11-24 DIAGNOSIS — M25561 Pain in right knee: Secondary | ICD-10-CM | POA: Diagnosis not present

## 2022-11-24 DIAGNOSIS — E876 Hypokalemia: Secondary | ICD-10-CM | POA: Diagnosis not present

## 2022-11-24 DIAGNOSIS — R52 Pain, unspecified: Secondary | ICD-10-CM | POA: Diagnosis not present

## 2022-11-24 DIAGNOSIS — Z820 Family history of epilepsy and other diseases of the nervous system: Secondary | ICD-10-CM | POA: Diagnosis not present

## 2022-11-24 DIAGNOSIS — R531 Weakness: Secondary | ICD-10-CM | POA: Diagnosis not present

## 2022-11-24 DIAGNOSIS — S72041A Displaced fracture of base of neck of right femur, initial encounter for closed fracture: Secondary | ICD-10-CM | POA: Diagnosis not present

## 2022-11-24 DIAGNOSIS — Y92009 Unspecified place in unspecified non-institutional (private) residence as the place of occurrence of the external cause: Secondary | ICD-10-CM | POA: Diagnosis not present

## 2022-11-24 DIAGNOSIS — R2689 Other abnormalities of gait and mobility: Secondary | ICD-10-CM | POA: Diagnosis not present

## 2022-11-24 DIAGNOSIS — M810 Age-related osteoporosis without current pathological fracture: Secondary | ICD-10-CM | POA: Diagnosis present

## 2022-11-24 DIAGNOSIS — D649 Anemia, unspecified: Secondary | ICD-10-CM | POA: Diagnosis not present

## 2022-11-24 LAB — CBC WITH DIFFERENTIAL/PLATELET
Abs Immature Granulocytes: 0.03 10*3/uL (ref 0.00–0.07)
Basophils Absolute: 0 10*3/uL (ref 0.0–0.1)
Basophils Relative: 0 %
Eosinophils Absolute: 0 10*3/uL (ref 0.0–0.5)
Eosinophils Relative: 0 %
HCT: 30.1 % — ABNORMAL LOW (ref 36.0–46.0)
Hemoglobin: 10.2 g/dL — ABNORMAL LOW (ref 12.0–15.0)
Immature Granulocytes: 0 %
Lymphocytes Relative: 14 %
Lymphs Abs: 1.4 10*3/uL (ref 0.7–4.0)
MCH: 21.4 pg — ABNORMAL LOW (ref 26.0–34.0)
MCHC: 33.9 g/dL (ref 30.0–36.0)
MCV: 63.2 fL — ABNORMAL LOW (ref 80.0–100.0)
Monocytes Absolute: 1.1 10*3/uL — ABNORMAL HIGH (ref 0.1–1.0)
Monocytes Relative: 12 %
Neutro Abs: 7 10*3/uL (ref 1.7–7.7)
Neutrophils Relative %: 74 %
Platelets: 183 10*3/uL (ref 150–400)
RBC: 4.76 MIL/uL (ref 3.87–5.11)
RDW: 15.6 % — ABNORMAL HIGH (ref 11.5–15.5)
WBC: 9.5 10*3/uL (ref 4.0–10.5)
nRBC: 0 % (ref 0.0–0.2)

## 2022-11-24 LAB — COMPREHENSIVE METABOLIC PANEL
ALT: 25 U/L (ref 0–44)
AST: 27 U/L (ref 15–41)
Albumin: 3.8 g/dL (ref 3.5–5.0)
Alkaline Phosphatase: 45 U/L (ref 38–126)
Anion gap: 8 (ref 5–15)
BUN: 10 mg/dL (ref 8–23)
CO2: 25 mmol/L (ref 22–32)
Calcium: 8.4 mg/dL — ABNORMAL LOW (ref 8.9–10.3)
Chloride: 88 mmol/L — ABNORMAL LOW (ref 98–111)
Creatinine, Ser: 0.55 mg/dL (ref 0.44–1.00)
GFR, Estimated: >60 mL/min (ref >60)
Glucose, Bld: 135 mg/dL — ABNORMAL HIGH (ref 70–99)
Potassium: 3.6 mmol/L (ref 3.5–5.1)
Sodium: 121 mmol/L — ABNORMAL LOW (ref 135–145)
Total Bilirubin: 0.9 mg/dL (ref 0.3–1.2)
Total Protein: 6.2 g/dL — ABNORMAL LOW (ref 6.5–8.1)

## 2022-11-24 LAB — SODIUM, URINE, RANDOM: Sodium, Ur: 30 mmol/L

## 2022-11-24 LAB — SURGICAL PCR SCREEN
MRSA, PCR: NEGATIVE
Staphylococcus aureus: NEGATIVE

## 2022-11-24 LAB — CREATININE, URINE, RANDOM: Creatinine, Urine: 21 mg/dL

## 2022-11-24 LAB — OSMOLALITY: Osmolality: 265 mOsm/kg — ABNORMAL LOW (ref 275–295)

## 2022-11-24 MED ORDER — POLYETHYLENE GLYCOL 3350 17 G PO PACK: 17.0000 g | PACK | Freq: Every day | ORAL | Status: DC | PRN

## 2022-11-24 MED ORDER — ALBUTEROL SULFATE (2.5 MG/3ML) 0.083% IN NEBU: 2.5000 mg | INHALATION_SOLUTION | RESPIRATORY_TRACT | Status: DC | PRN

## 2022-11-24 MED ORDER — FENTANYL CITRATE PF 50 MCG/ML IJ SOSY
50.0000 ug | PREFILLED_SYRINGE | Freq: Once | INTRAMUSCULAR | Status: AC
  Administered 2022-11-24: 50 ug via INTRAVENOUS
  Filled 2022-11-24: qty 1

## 2022-11-24 MED ORDER — FENTANYL CITRATE PF 50 MCG/ML IJ SOSY
25.0000 ug | PREFILLED_SYRINGE | Freq: Once | INTRAMUSCULAR | Status: AC
  Administered 2022-11-24: 25 ug via INTRAVENOUS
  Filled 2022-11-24: qty 1

## 2022-11-24 MED ORDER — TRAZODONE HCL 50 MG PO TABS: 25.0000 mg | ORAL_TABLET | Freq: Every evening | ORAL | Status: DC | PRN

## 2022-11-24 MED ORDER — ONDANSETRON HCL 4 MG/2ML IJ SOLN: 4.0000 mg | Freq: Four times a day (QID) | INTRAMUSCULAR | Status: DC | PRN

## 2022-11-24 MED ORDER — SODIUM CHLORIDE 0.9 % IV SOLN: INTRAVENOUS | Status: DC

## 2022-11-24 MED ORDER — ATORVASTATIN CALCIUM 10 MG PO TABS
10.0000 mg | ORAL_TABLET | Freq: Every evening | ORAL | Status: DC
  Administered 2022-11-24 – 2022-11-26 (×3): 10 mg via ORAL
  Filled 2022-11-24 (×3): qty 1

## 2022-11-24 MED ORDER — ACETAMINOPHEN 650 MG RE SUPP: 650.0000 mg | Freq: Four times a day (QID) | RECTAL | Status: DC | PRN

## 2022-11-24 MED ORDER — MORPHINE SULFATE (PF) 2 MG/ML IV SOLN
2.0000 mg | INTRAVENOUS | Status: DC | PRN
  Administered 2022-11-24 – 2022-11-25 (×2): 2 mg via INTRAVENOUS
  Filled 2022-11-24 (×2): qty 1

## 2022-11-24 MED ORDER — ACETAMINOPHEN 325 MG PO TABS
650.0000 mg | ORAL_TABLET | Freq: Four times a day (QID) | ORAL | Status: DC | PRN
  Administered 2022-11-24: 650 mg via ORAL
  Filled 2022-11-24: qty 2

## 2022-11-24 MED ORDER — DOCUSATE SODIUM 100 MG PO CAPS
100.0000 mg | ORAL_CAPSULE | Freq: Two times a day (BID) | ORAL | Status: DC
  Administered 2022-11-24: 100 mg via ORAL
  Filled 2022-11-24: qty 1

## 2022-11-24 MED ORDER — OXYCODONE HCL 5 MG PO TABS
5.0000 mg | ORAL_TABLET | ORAL | Status: DC | PRN
  Administered 2022-11-24 – 2022-11-25 (×2): 5 mg via ORAL
  Filled 2022-11-24 (×2): qty 1

## 2022-11-24 MED ORDER — METOPROLOL TARTRATE 5 MG/5ML IV SOLN: 5.0000 mg | Freq: Four times a day (QID) | INTRAVENOUS | Status: DC | PRN

## 2022-11-24 MED ORDER — ONDANSETRON HCL 4 MG PO TABS: 4.0000 mg | ORAL_TABLET | Freq: Four times a day (QID) | ORAL | Status: DC | PRN

## 2022-11-24 NOTE — Consult Note (Signed)
  ORTHOPAEDIC CONSULTATION  REQUESTING PHYSICIAN: Ikramullah, Mir M, MD  PCP:  Hunter, Stephen O, MD  Chief Complaint: Right hip and knee pain from fall   HPI: Abigail Shah is a 82 y.o. female osteopenia, hyperlipidemia being admitted to the hospital with multiple falls, complaints of right knee pain, and acute right subcapital femoral neck fracture. She has had several falls recently, she was actually seen in the ER on 6/3 for a fall at home resulting in proximal right humeral fracture. She was seen at [facility] as outpatient the following day, currently wearing sling. Late last night, she was being assisted into her wheelchair and her right knee buckled as it has been doing. She sort of fell back into the wheelchair. She continued to have pretty severe right knee pain, never had any hip pain and so was brought to the ER this morning for evaluation. Imaging in the emergency department is consistent with subcapital acute right femoral neck fracture. Right knee without fracture. Orthopedics was consulted for right hip fracture and right knee pain. CT scan ordered of the right knee, negative for acute fracture or dislocation. Patient denies any hip pain only right knee pain. She denies any tingling or numbness in LE bilaterally. She denies any anticoagulant use.  She lives at independent living at [facility]. She was recently transferred to the care center at [facility] due to right humerus fracture, currently in sling. She states normally she does not use any assistive devices.   Past Medical History:  Diagnosis Date   Anemia    Atypical nevus 05/21/2003   Right Fifth Toe-Slight   Atypical nevus 11/27/2012   Right Hip-Moderate   Atypical nevus 12/27/2016   Left Neck-Moderate(widershave)   Atypical nevus x 2 10/21/2001   Left Lower Back-Slight to Moderate and Right Post Upper Arm- Slight   BCC (basal cell carcinoma of skin) 10/21/2001   Left Side Scalp   BCC (basal cell carcinoma of  skin) 11/23/2009   Left Forehead(curet and cautery)   BCC (basal cell carcinoma of skin) 08/01/2011   Left Upper Lip(curet)   Hyperlipidemia    Osteopenia    Skin cancer    hx of scalp   Thalassemia    Past Surgical History:  Procedure Laterality Date   APPENDECTOMY     BUNIONECTOMY     complicated with fractured toe x 2 feet   CATARACT EXTRACTION     bilateral   CESAREAN SECTION     x2   COLONOSCOPY  2003   DILATION AND CURETTAGE OF UTERUS     miscarriage     x2   skin cancer scalp     TONSILLECTOMY AND ADENOIDECTOMY     TUBAL LIGATION     vertigo     Social History   Socioeconomic History   Marital status: Widowed    Spouse name: Not on file   Number of children: Not on file   Years of education: Not on file   Highest education level: Not on file  Occupational History   Not on file  Tobacco Use   Smoking status: Never   Smokeless tobacco: Never  Vaping Use   Vaping Use: Never used  Substance and Sexual Activity   Alcohol use: No   Drug use: No   Sexual activity: Never    Birth control/protection: Post-menopausal  Other Topics Concern   Not on file  Social History Narrative   Lives alone. Widowed 1987. 2 children. 4 grandkids. No greatgrandkids.      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       Hobbies: a lot of travel because son lives overseas mostly, daughter in [place].    Same church since 1962.       Advanced directives. HCPOA- both children. DNR   Social Determinants of Health   Financial Resource Strain: Not on file  Food Insecurity: No Food Insecurity (11/24/2022)   Hunger Vital Sign    Worried About Running Out of Food in the Last Year: Never true    Ran Out of Food in the Last Year: Never true  Transportation Needs: No Transportation Needs (11/24/2022)   PRAPARE - Transportation    Lack of Transportation (Medical): No    Lack of  Transportation (Non-Medical): No  Physical Activity: Not on file  Stress: Not on file  Social Connections: Not on file   Family History  Problem Relation Age of Onset   Dementia Mother    Hyperlipidemia Father    Heart attack Father 77   Multiple sclerosis Brother        actually radiation poisoning   Hyperlipidemia Other    Stroke Other    Breast cancer Neg Hx    No Known Allergies Prior to Admission medications   Medication Sig Start Date End Date Taking? Authorizing Provider  acetaminophen (TYLENOL) 650 MG CR tablet Take 650 mg by mouth every 6 (six) hours as needed for pain.   Yes [provider]  atorvastatin (LIPITOR) 10 MG tablet Take 1 tablet (10 mg total) by mouth daily. Patient taking differently: Take 10 mg by mouth every evening. 04/28/22  Yes Hunter, Stephen O, MD  bisacodyl (DULCOLAX) 10 MG suppository Place 10 mg rectally as needed for moderate constipation.   Yes [provider]  Calcium Carbonate-Vitamin D 500-3.125 MG-MCG TABS Take by mouth.   Yes [provider]  Cyanocobalamin (B-12) 1000 MCG SUBL Place under the tongue.   Yes [provider]  ibuprofen (ADVIL) 600 MG tablet Take 600 mg by mouth every 6 (six) hours as needed for mild pain.   Yes [provider]  naproxen (NAPROSYN) 375 MG tablet Take 1 tablet (375 mg total) by mouth 2 (two) times daily. 11/21/22  Yes Kommor, Madison, MD  ondansetron (ZOFRAN) 4 MG tablet Take 4 mg by mouth every 6 (six) hours as needed for nausea or vomiting.   Yes [provider]  oxyCODONE (ROXICODONE) 5 MG immediate release tablet Take 1 tablet (5 mg total) by mouth every 4 (four) hours as needed for breakthrough pain. 11/21/22  Yes Kommor, Madison, MD  polyethylene glycol (MIRALAX / GLYCOLAX) 17 g packet Take 17 g by mouth daily.   Yes [provider]  promethazine (PHENERGAN) 12.5 MG suppository Place 12.5 mg rectally once. 11/22/22  Yes [provider]   CT KNEE  RIGHT WO CONTRAST  Result Date: 11/24/2022 CLINICAL DATA:  Fall, right femoral neck fracture, clinical concern for knee fracture occult on radiography. EXAM: CT OF THE RIGHT KNEE WITHOUT CONTRAST TECHNIQUE: Multidetector CT imaging of the right knee was performed according to the standard protocol. Multiplanar CT image reconstructions were also generated. RADIATION DOSE REDUCTION: This exam was performed according to the departmental dose-optimization program which includes automated exposure control, adjustment of the mA and/or kV according to patient size and/or use of iterative reconstruction technique. COMPARISON:  Radiograph 11/24/2022 FINDINGS:   Bones/Joint/Cartilage Tricompartmental osteoarthritis with subcortical cyst formation and marginal spurring. Well corticated chronically fragmented lateral patellar spurs. No acute fracture identified. No knee joint effusion to further suggest occult fracture. Ligaments Suboptimally assessed by CT. Muscles and Tendons Unremarkable Soft tissues Mild prepatellar subcutaneous edema. IMPRESSION: 1. No acute fracture identified. 2. Tricompartmental osteoarthritis. 3. Mild prepatellar subcutaneous edema. Electronically Signed   By: Walter  Liebkemann M.D.   On: 11/24/2022 11:14   DG Pelvis Portable  Result Date: 11/24/2022 CLINICAL DATA:  Status post fall. EXAM: PORTABLE PELVIS 1-2 VIEWS COMPARISON:  None Available. FINDINGS: There is an acute right subcapital femoral neck fracture. Superior displacement of the distal fracture fragments noted. No sign of dislocation. Left hip appears intact and located. No signs of pelvic fracture or diastasis. IMPRESSION: Acute right subcapital femoral neck fracture. Electronically Signed   By: Taylor  Stroud M.D.   On: 11/24/2022 07:22   DG Knee Right Port  Result Date: 11/24/2022 CLINICAL DATA:  Right knee pain.  Fall. EXAM: PORTABLE RIGHT KNEE - 1-2 VIEW COMPARISON:  None Available. FINDINGS: No significant joint effusion. No signs  of acute fracture or dislocation. Moderate tricompartment joint space narrowing with marginal spur formation and sharpening of the tibial spines. Soft tissues appear unremarkable. IMPRESSION: 1. No acute findings. 2. Moderate tricompartment osteoarthritis. Electronically Signed   By: Taylor  Stroud M.D.   On: 11/24/2022 07:01   DG Chest Portable 1 View  Result Date: 11/24/2022 CLINICAL DATA:  Fall.  Knee pain.  Weakness. EXAM: PORTABLE CHEST 1 VIEW COMPARISON:  None Available. FINDINGS: Normal heart size. Streaky opacities noted within the left base compatible with atelectasis versus scarring. No pleural effusion, and interstitial edema or pneumothorax. There is an acute fracture involving the humeral neck and greater tuberosity as noted on films from 11/20/2022. IMPRESSION: 1. Left base atelectasis versus scarring. 2. Acute fracture involving the left humeral neck and greater tuberosity. Electronically Signed   By: Taylor  Stroud M.D.   On: 11/24/2022 06:59    Positive ROS: All other systems have been reviewed and were otherwise negative with the exception of those mentioned in the HPI and as above.  Physical Exam: General: Alert, no acute distress Cardiovascular: No pedal edema Respiratory: No cyanosis, no use of accessory musculature GI: No organomegaly, abdomen is soft and non-tender Skin: No lesions in the area of chief complaint Neurologic: Sensation intact distally Psychiatric: Patient is competent for consent with normal mood and affect Lymphatic: No axillary or cervical lymphadenopathy  MUSCULOSKELETAL:  Right shoulder, patient in sling.   Examination of the right hip reveals no skin wounds or lesions. Contusion noted to lateral hip. Pain with movement and palpation. Slight external rotation and shortening noted.   Examination of the right knee reveals no skin wounds or lesions. Anterior contusion noted. Pain with palpation over the periretinacular tissues, medial joint line. Pain and  focal swelling over the pes anserine insertion. Patient guarded during examination, limiting ROM and ligamentous testing.   Sensory and motor function intact in LE bilaterally including plantar flexion, dorsiflexion, and EHL. Distal pedal pulses 2+ bilaterally. Compartments soft and non-tender. No significant pedal edema in LE bilaterally. Capillary refill <2 seconds.   Assessment: Displaced right femoral neck fracture.  Right knee pain.  Right humerus fracture prior to admission.   Plan: I discussed the findings with the patient. We reviewed her x-rays. She has a displaced right femoral neck fracture that will require surgical treatment for pain control and allow immediate mobilization out of bed. [facility]   has been consulted for admission and perioperative risk stratification. Patient needs right total hip arthroplasty. Due to lack of OR availability systemwide, surgery scheduled for tomorrow am. Discussed R/B/A. See statement of risk. NPO after MN tonight. Hold chemical DVT ppx.    In regards to her right knee. CT negative for acute fracture or dislocation. Exam limited today due to guarding during examination. Continue ice and elevation. Will continue to monitor. All questions solicited and answered.    The risks, benefits, and alternatives were discussed with the patient. There are risks associated with the surgery including, but not limited to, problems with anesthesia (death), infection, differences in leg length/angulation/rotation, fracture of bones, loosening or failure of implants, malunion, nonunion, hematoma (blood accumulation) which may require surgical drainage, blood clots, pulmonary embolism, nerve injury (foot drop), and blood vessel injury. The patient understands these risks and elects to proceed.   Felipe Paluch S Nekeya Briski, PA-C    11/24/2022 5:13 PM  

## 2022-11-24 NOTE — H&P (View-Only) (Signed)
ORTHOPAEDIC CONSULTATION  REQUESTING PHYSICIAN: Maryln Gottron, MD  PCP:  Shelva Majestic, MD  Chief Complaint: Right hip and knee pain from fall   HPI: Abigail Shah is a 82 y.o. female osteopenia, hyperlipidemia being admitted to the hospital with multiple falls, complaints of right knee pain, and acute right subcapital femoral neck fracture. She has had several falls recently, she was actually seen in the ER on 6/3 for a fall at home resulting in proximal right humeral fracture. She was seen at [facility] as outpatient the following day, currently wearing sling. Late last night, she was being assisted into her wheelchair and her right knee buckled as it has been doing. She sort of fell back into the wheelchair. She continued to have pretty severe right knee pain, never had any hip pain and so was brought to the ER this morning for evaluation. Imaging in the emergency department is consistent with subcapital acute right femoral neck fracture. Right knee without fracture. Orthopedics was consulted for right hip fracture and right knee pain. CT scan ordered of the right knee, negative for acute fracture or dislocation. Patient denies any hip pain only right knee pain. She denies any tingling or numbness in LE bilaterally. She denies any anticoagulant use.  She lives at independent living at [place]. She was recently transferred to the care center at [facility] due to right humerus fracture, currently in sling. She states normally she does not use any assistive devices.   Past Medical History:  Diagnosis Date   Anemia    Atypical nevus 05/21/2003   Right Fifth Toe-Slight   Atypical nevus 11/27/2012   Right Hip-Moderate   Atypical nevus 12/27/2016   Left Neck-Moderate(widershave)   Atypical nevus x 2 10/21/2001   Left Lower Back-Slight to Moderate and Right Post Upper Arm- Slight   BCC (basal cell carcinoma of skin) 10/21/2001   Left Side Scalp   BCC (basal cell carcinoma of  skin) 11/23/2009   Left Forehead(curet and cautery)   BCC (basal cell carcinoma of skin) 08/01/2011   Left Upper Lip(curet)   Hyperlipidemia    Osteopenia    Skin cancer    hx of scalp   Thalassemia    Past Surgical History:  Procedure Laterality Date   APPENDECTOMY     BUNIONECTOMY     complicated with fractured toe x 2 feet   CATARACT EXTRACTION     bilateral   CESAREAN SECTION     x2   COLONOSCOPY  2003   DILATION AND CURETTAGE OF UTERUS     miscarriage     x2   skin cancer scalp     TONSILLECTOMY AND ADENOIDECTOMY     TUBAL LIGATION     vertigo     Social History   Socioeconomic History   Marital status: Widowed    Spouse name: Not on file   Number of children: Not on file   Years of education: Not on file   Highest education level: Not on file  Occupational History   Not on file  Tobacco Use   Smoking status: Never   Smokeless tobacco: Never  Vaping Use   Vaping Use: Never used  Substance and Sexual Activity   Alcohol use: No   Drug use: No   Sexual activity: Never    Birth control/protection: Post-menopausal  Other Topics Concern   Not on file  Social History Narrative   Lives alone. Widowed 1987. 2 children. 4 grandkids. No greatgrandkids.  Lived in [place] since going to [employer] college (livesd away for a few years)      Retired from Physiological scientist. Still doing a lot.       Hobbies: a lot of travel because son lives overseas mostly, daughter in [place].    Same church since 1962.       Advanced directives. HCPOA- both children. DNR   Social Determinants of Health   Financial Resource Strain: Not on file  Food Insecurity: No Food Insecurity (11/24/2022)   Hunger Vital Sign    Worried About Running Out of Food in the Last Year: Never true    Ran Out of Food in the Last Year: Never true  Transportation Needs: No Transportation Needs (11/24/2022)   PRAPARE - Administrator, Civil Service (Medical): No    Lack of  Transportation (Non-Medical): No  Physical Activity: Not on file  Stress: Not on file  Social Connections: Not on file   Family History  Problem Relation Age of Onset   Dementia Mother    Hyperlipidemia Father    Heart attack Father 66   Multiple sclerosis Brother        actually radiation poisoning   Hyperlipidemia Other    Stroke Other    Breast cancer Neg Hx    No Known Allergies Prior to Admission medications   Medication Sig Start Date End Date Taking? Authorizing Provider  acetaminophen (TYLENOL) 650 MG CR tablet Take 650 mg by mouth every 6 (six) hours as needed for pain.   Yes [provider]  atorvastatin (LIPITOR) 10 MG tablet Take 1 tablet (10 mg total) by mouth daily. Patient taking differently: Take 10 mg by mouth every evening. 04/28/22  Yes Shelva Majestic, MD  bisacodyl (DULCOLAX) 10 MG suppository Place 10 mg rectally as needed for moderate constipation.   Yes [provider]  Calcium Carbonate-Vitamin D 500-3.125 MG-MCG TABS Take by mouth.   Yes [provider]  Cyanocobalamin (B-12) 1000 MCG SUBL Place under the tongue.   Yes [provider]  ibuprofen (ADVIL) 600 MG tablet Take 600 mg by mouth every 6 (six) hours as needed for mild pain.   Yes [provider]  naproxen (NAPROSYN) 375 MG tablet Take 1 tablet (375 mg total) by mouth 2 (two) times daily. 11/21/22  Yes Kommor, Madison, MD  ondansetron (ZOFRAN) 4 MG tablet Take 4 mg by mouth every 6 (six) hours as needed for nausea or vomiting.   Yes [provider]  oxyCODONE (ROXICODONE) 5 MG immediate release tablet Take 1 tablet (5 mg total) by mouth every 4 (four) hours as needed for breakthrough pain. 11/21/22  Yes Kommor, Madison, MD  polyethylene glycol (MIRALAX / GLYCOLAX) 17 g packet Take 17 g by mouth daily.   Yes [provider]  promethazine (PHENERGAN) 12.5 MG suppository Place 12.5 mg rectally once. 11/22/22  Yes [provider]   CT KNEE  RIGHT WO CONTRAST  Result Date: 11/24/2022 CLINICAL DATA:  Fall, right femoral neck fracture, clinical concern for knee fracture occult on radiography. EXAM: CT OF THE RIGHT KNEE WITHOUT CONTRAST TECHNIQUE: Multidetector CT imaging of the right knee was performed according to the standard protocol. Multiplanar CT image reconstructions were also generated. RADIATION DOSE REDUCTION: This exam was performed according to the departmental dose-optimization program which includes automated exposure control, adjustment of the mA and/or kV according to patient size and/or use of iterative reconstruction technique. COMPARISON:  Radiograph 11/24/2022 FINDINGS:  Bones/Joint/Cartilage Tricompartmental osteoarthritis with subcortical cyst formation and marginal spurring. Well corticated chronically fragmented lateral patellar spurs. No acute fracture identified. No knee joint effusion to further suggest occult fracture. Ligaments Suboptimally assessed by CT. Muscles and Tendons Unremarkable Soft tissues Mild prepatellar subcutaneous edema. IMPRESSION: 1. No acute fracture identified. 2. Tricompartmental osteoarthritis. 3. Mild prepatellar subcutaneous edema. Electronically Signed   By: Gaylyn Rong M.D.   On: 11/24/2022 11:14   DG Pelvis Portable  Result Date: 11/24/2022 CLINICAL DATA:  Status post fall. EXAM: PORTABLE PELVIS 1-2 VIEWS COMPARISON:  None Available. FINDINGS: There is an acute right subcapital femoral neck fracture. Superior displacement of the distal fracture fragments noted. No sign of dislocation. Left hip appears intact and located. No signs of pelvic fracture or diastasis. IMPRESSION: Acute right subcapital femoral neck fracture. Electronically Signed   By: Signa Kell M.D.   On: 11/24/2022 07:22   DG Knee Right Port  Result Date: 11/24/2022 CLINICAL DATA:  Right knee pain.  Fall. EXAM: PORTABLE RIGHT KNEE - 1-2 VIEW COMPARISON:  None Available. FINDINGS: No significant joint effusion. No signs  of acute fracture or dislocation. Moderate tricompartment joint space narrowing with marginal spur formation and sharpening of the tibial spines. Soft tissues appear unremarkable. IMPRESSION: 1. No acute findings. 2. Moderate tricompartment osteoarthritis. Electronically Signed   By: Signa Kell M.D.   On: 11/24/2022 07:01   DG Chest Portable 1 View  Result Date: 11/24/2022 CLINICAL DATA:  Fall.  Knee pain.  Weakness. EXAM: PORTABLE CHEST 1 VIEW COMPARISON:  None Available. FINDINGS: Normal heart size. Streaky opacities noted within the left base compatible with atelectasis versus scarring. No pleural effusion, and interstitial edema or pneumothorax. There is an acute fracture involving the humeral neck and greater tuberosity as noted on films from 11/20/2022. IMPRESSION: 1. Left base atelectasis versus scarring. 2. Acute fracture involving the left humeral neck and greater tuberosity. Electronically Signed   By: Signa Kell M.D.   On: 11/24/2022 06:59    Positive ROS: All other systems have been reviewed and were otherwise negative with the exception of those mentioned in the HPI and as above.  Physical Exam: General: Alert, no acute distress Cardiovascular: No pedal edema Respiratory: No cyanosis, no use of accessory musculature GI: No organomegaly, abdomen is soft and non-tender Skin: No lesions in the area of chief complaint Neurologic: Sensation intact distally Psychiatric: Patient is competent for consent with normal mood and affect Lymphatic: No axillary or cervical lymphadenopathy  MUSCULOSKELETAL:  Right shoulder, patient in sling.   Examination of the right hip reveals no skin wounds or lesions. Contusion noted to lateral hip. Pain with movement and palpation. Slight external rotation and shortening noted.   Examination of the right knee reveals no skin wounds or lesions. Anterior contusion noted. Pain with palpation over the periretinacular tissues, medial joint line. Pain and  focal swelling over the pes anserine insertion. Patient guarded during examination, limiting ROM and ligamentous testing.   Sensory and motor function intact in LE bilaterally including plantar flexion, dorsiflexion, and EHL. Distal pedal pulses 2+ bilaterally. Compartments soft and non-tender. No significant pedal edema in LE bilaterally. Capillary refill <2 seconds.   Assessment: Displaced right femoral neck fracture.  Right knee pain.  Right humerus fracture prior to admission.   Plan: I discussed the findings with the patient. We reviewed her x-rays. She has a displaced right femoral neck fracture that will require surgical treatment for pain control and allow immediate mobilization out of bed. TRH  has been consulted for admission and perioperative risk stratification. Patient needs right total hip arthroplasty. Due to lack of OR availability systemwide, surgery scheduled for tomorrow am. Discussed R/B/A. See statement of risk. NPO after MN tonight. Hold chemical DVT ppx.    In regards to her right knee. CT negative for acute fracture or dislocation. Exam limited today due to guarding during examination. Continue ice and elevation. Will continue to monitor. All questions solicited and answered.    The risks, benefits, and alternatives were discussed with the patient. There are risks associated with the surgery including, but not limited to, problems with anesthesia (death), infection, differences in leg length/angulation/rotation, fracture of bones, loosening or failure of implants, malunion, nonunion, hematoma (blood accumulation) which may require surgical drainage, blood clots, pulmonary embolism, nerve injury (foot drop), and blood vessel injury. The patient understands these risks and elects to proceed.   Clois Dupes, PA-C    11/24/2022 5:13 PM

## 2022-11-24 NOTE — H&P (Signed)
History and Physical  Abigail Shah VHQ:[id number] DOB: 1941-02-28 DOA: 11/24/2022  PCP: Shelva Majestic, MD   Chief Complaint: Right knee pain  HPI: Abigail Shah is a 82 y.o. female with medical history significant for osteopenia, hyperlipidemia being admitted to the hospital with multiple falls, complaints of right knee pain, and acute right subcapital femoral neck fracture.  History is provided by the patient, as well as her sister-in-law who is at the bedside in the ER this morning.  Apparently the patient has had several falls recently, she was actually seen in the ER on 6/3 for a fall at home resulting in proximal right humeral fracture.  She saw orthopedics as an outpatient the following day.  Late last night, she was being assisted into her wheelchair and her right knee buckled as it has been doing.  She sort of fell back into the wheelchair.  She continued to have pretty severe right knee pain, never had any hip pain and so was brought to the ER this morning for evaluation.  Imaging in the emergency department is consistent with subcapital acute right femoral neck fracture.  Right knee without fracture.  ER provider discussed with orthopedic surgery, who plans for surgical repair in the morning.  In the meantime, they have ordered CT of the right knee as well.  Review of Systems: Please see HPI for pertinent positives and negatives. A complete 10 system review of systems are otherwise negative.  Past Medical History:  Diagnosis Date   Anemia    Atypical nevus 05/21/2003   Right Fifth Toe-Slight   Atypical nevus 11/27/2012   Right Hip-Moderate   Atypical nevus 12/27/2016   Left Neck-Moderate(widershave)   Atypical nevus x 2 10/21/2001   Left Lower Back-Slight to Moderate and Right Post Upper Arm- Slight   BCC (basal cell carcinoma of skin) 10/21/2001   Left Side Scalp   BCC (basal cell carcinoma of skin) 11/23/2009   Left Forehead(curet and cautery)   BCC (basal cell  carcinoma of skin) 08/01/2011   Left Upper Lip(curet)   Hyperlipidemia    Osteopenia    Skin cancer    hx of scalp   Thalassemia    Past Surgical History:  Procedure Laterality Date   APPENDECTOMY     BUNIONECTOMY     complicated with fractured toe x 2 feet   CATARACT EXTRACTION     bilateral   CESAREAN SECTION     x2   COLONOSCOPY  2003   DILATION AND CURETTAGE OF UTERUS     miscarriage     x2   skin cancer scalp     TONSILLECTOMY AND ADENOIDECTOMY     TUBAL LIGATION     vertigo     Social History:  reports that she has never smoked. She has never used smokeless tobacco. She reports that she does not drink alcohol and does not use drugs.  No Known Allergies  Family History  Problem Relation Age of Onset   Dementia Mother    Hyperlipidemia Father    Heart attack Father 50   Multiple sclerosis Brother        actually radiation poisoning   Hyperlipidemia Other    Stroke Other    Breast cancer Neg Hx      Prior to Admission medications   Medication Sig Start Date End Date Taking? Authorizing Provider  atorvastatin (LIPITOR) 10 MG tablet Take 1 tablet (10 mg total) by mouth daily. Patient taking differently: Take 10 mg by  mouth every evening. 04/28/22   Shelva Majestic, MD  Calcium Carbonate-Vitamin D 500-3.125 MG-MCG TABS Take by mouth.    [provider]  Cyanocobalamin (B-12) 1000 MCG SUBL Place under the tongue.    [provider]  naproxen (NAPROSYN) 375 MG tablet Take 1 tablet (375 mg total) by mouth 2 (two) times daily. 11/21/22   Kommor, Madison, MD  oxyCODONE (ROXICODONE) 5 MG immediate release tablet Take 1 tablet (5 mg total) by mouth every 4 (four) hours as needed for breakthrough pain. 11/21/22   Kommor, Wyn Forster, MD    Physical Exam: BP (!) 158/74   Pulse 84   Temp 97.8 F (36.6 C) (Oral)   Resp 13   Ht 5\' 3"  (1.6 m)   Wt 69.4 kg   LMP  (LMP Unknown)   SpO2 98%   BMI 27.10 kg/m   General:  Alert, oriented, calm, in no acute  distress, looks slightly uncomfortable due to right knee pain, appears her stated age Eyes: EOMI, clear conjuctivae, white sclerea Neck: supple, no masses, trachea mildline  Cardiovascular: RRR, no murmurs or rubs, no peripheral edema  Respiratory: clear to auscultation bilaterally, no wheezes, no crackles  Abdomen: soft, nontender, nondistended, normal bowel tones heard  Skin: dry, no rashes  Musculoskeletal: no joint effusions, right upper extremity in sling Psychiatric: appropriate affect, normal speech  Neurologic: extraocular muscles intact, clear speech, moving all extremities with intact sensorium          Labs on Admission:  Basic Metabolic Panel: Recent Labs  Lab 11/20/22 2053 11/24/22 0815  NA 130* 121*  K 4.1 3.6  CL 96* 88*  CO2 24 25  GLUCOSE 126* 135*  BUN 21 10  CREATININE 0.78 0.55  CALCIUM 9.1 8.4*   Liver Function Tests: Recent Labs  Lab 11/24/22 0815  AST 27  ALT 25  ALKPHOS 45  BILITOT 0.9  PROT 6.2*  ALBUMIN 3.8   No results for input(s): "LIPASE", "AMYLASE" in the last 168 hours. No results for input(s): "AMMONIA" in the last 168 hours. CBC: Recent Labs  Lab 11/20/22 2053 11/24/22 0815  WBC 7.5 9.5  NEUTROABS 5.6 7.0  HGB 10.6* 10.2*  HCT 33.5* 30.1*  MCV 65.6* 63.2*  PLT 197 183   Cardiac Enzymes: No results for input(s): "CKTOTAL", "CKMB", "CKMBINDEX", "TROPONINI" in the last 168 hours.  BNP (last 3 results) No results for input(s): "BNP" in the last 8760 hours.  ProBNP (last 3 results) No results for input(s): "PROBNP" in the last 8760 hours.  CBG: No results for input(s): "GLUCAP" in the last 168 hours.  Radiological Exams on Admission: DG Pelvis Portable  Result Date: 11/24/2022 CLINICAL DATA:  Status post fall. EXAM: PORTABLE PELVIS 1-2 VIEWS COMPARISON:  None Available. FINDINGS: There is an acute right subcapital femoral neck fracture. Superior displacement of the distal fracture fragments noted. No sign of dislocation.  Left hip appears intact and located. No signs of pelvic fracture or diastasis. IMPRESSION: Acute right subcapital femoral neck fracture. Electronically Signed   By: Signa Kell M.D.   On: 11/24/2022 07:22   DG Knee Right Port  Result Date: 11/24/2022 CLINICAL DATA:  Right knee pain.  Fall. EXAM: PORTABLE RIGHT KNEE - 1-2 VIEW COMPARISON:  None Available. FINDINGS: No significant joint effusion. No signs of acute fracture or dislocation. Moderate tricompartment joint space narrowing with marginal spur formation and sharpening of the tibial spines. Soft tissues appear unremarkable. IMPRESSION: 1. No acute findings. 2. Moderate tricompartment osteoarthritis. Electronically  Signed   By: Signa Kell M.D.   On: 11/24/2022 07:01   DG Chest Portable 1 View  Result Date: 11/24/2022 CLINICAL DATA:  Fall.  Knee pain.  Weakness. EXAM: PORTABLE CHEST 1 VIEW COMPARISON:  None Available. FINDINGS: Normal heart size. Streaky opacities noted within the left base compatible with atelectasis versus scarring. No pleural effusion, and interstitial edema or pneumothorax. There is an acute fracture involving the humeral neck and greater tuberosity as noted on films from 11/20/2022. IMPRESSION: 1. Left base atelectasis versus scarring. 2. Acute fracture involving the left humeral neck and greater tuberosity. Electronically Signed   By: Signa Kell M.D.   On: 11/24/2022 06:59    Assessment/Plan This is an 82 year old female with a history of hyperlipidemia, osteopenia being admitted to the hospitalist service with an acute right hip fracture, and hyponatremia.  Closed right hip fracture, initial encounter [facility]) -Inpatient admission -Pain and nausea control -N.p.o. at midnight for anticipated surgical repair in the morning  Right knee pain-orthopedic surgery has ordered right knee CT  Hyperlipidemia-continue home statin  Osteopenia  Hyponatremia-this is likely hypovolemic hyponatremia in the setting of reduced  oral intake and recent vomiting.  She has normal mental status, no complaints of headache, confusion, etc. -Normal saline infusion -Recheck sodium this evening -Check urine sodium, serum osms, etc.  Vomiting-unclear etiology, as needed Zofran  DVT prophylaxis: SCDs    Code Status: Full Code  Consults called: Orthopedic surgery Dr. Linna Caprice  Admission status: The appropriate patient status for this patient is INPATIENT. Inpatient status is judged to be reasonable and necessary in order to provide the required intensity of service to ensure the patient's safety. The patient's presenting symptoms, physical exam findings, and initial radiographic and laboratory data in the context of their chronic comorbidities is felt to place them at high risk for further clinical deterioration. Furthermore, it is not anticipated that the patient will be medically stable for discharge from the hospital within 2 midnights of admission.    I certify that at the point of admission it is my clinical judgment that the patient will require inpatient hospital care spanning beyond 2 midnights from the point of admission due to high intensity of service, high risk for further deterioration and high frequency of surveillance required  Time spent: 52 minutes  Trig Mcbryar Sharlette Dense MD [facility] Pager [phone]  If 7PM-7AM, please contact night-coverage [email or web address] Password [email or web address]  11/24/2022, 10:07 AM

## 2022-11-24 NOTE — Progress Notes (Addendum)
Consult received for right hip injury. Imaging reviewed, showing displaced R femoral neck fx. TRH has been consulted for admission and perioperative risk stratification. Patient needs right total hip arthroplasty. Due to lack of OR availability systemwide, surgery scheduled for tomorrow am. NPO after MN tonight. Hold chemical DVT ppx. Full consult to follow.  ADDENDUM: Chart review indicates patient has had multiple falls recently due to her right knee. She actually fell a few days ago, sustaining a right proximal humerus fracture. X-rays of the knee were (-), so I have ordered a CT of the R knee.

## 2022-11-24 NOTE — ED Provider Notes (Signed)
[place] EMERGENCY DEPARTMENT AT [facility] Provider Note   CSN: [id number] Arrival date & time: 11/24/22  4098     History Anemia, Humerus fx.  Chief Complaint  Patient presents with   Knee Pain    Abigail Shah is a 82 y.o. female.  82 y.o female with a PMH of Anemia, humerus fx presents to the ED via EMS with a chief complaint of right knee pain. Patient reports she was ambulating to the bathroom today at her facility when suddenly she felt her right knee buckle.  She reports she was lower to the ground, did not fall on her knee, did not hit her head.  She did suffer a fall on Monday, reports she was seen here for this previously.  She received 50 mics of fentanyl in route with EMS. Reports worsening pain with flexion of the right knee. In addition, evaluated at [facility] on Wednesday and had xray of her knee without abnormal findings aside from arthritis. No fever, no LOC, no other complaints.   The history is provided by the patient.  Knee Pain Associated symptoms: no fever        Home Medications Prior to Admission medications   Medication Sig Start Date End Date Taking? Authorizing Provider  atorvastatin (LIPITOR) 10 MG tablet Take 1 tablet (10 mg total) by mouth daily. Patient taking differently: Take 10 mg by mouth every evening. 04/28/22   Shelva Majestic, MD  Calcium Carbonate-Vitamin D 500-3.125 MG-MCG TABS Take by mouth.    [provider]  Cyanocobalamin (B-12) 1000 MCG SUBL Place under the tongue.    [provider]  naproxen (NAPROSYN) 375 MG tablet Take 1 tablet (375 mg total) by mouth 2 (two) times daily. 11/21/22   Kommor, Madison, MD  oxyCODONE (ROXICODONE) 5 MG immediate release tablet Take 1 tablet (5 mg total) by mouth every 4 (four) hours as needed for breakthrough pain. 11/21/22   Kommor, Wyn Forster, MD      Allergies    Patient has no known allergies.    Review of Systems   Review of Systems  Constitutional:  Negative  for chills and fever.  Respiratory:  Negative for shortness of breath.   Cardiovascular:  Negative for chest pain.  Musculoskeletal:  Positive for arthralgias.  All other systems reviewed and are negative.   Physical Exam Updated Vital Signs BP (!) 158/74   Pulse 84   Temp 97.8 F (36.6 C) (Oral)   Resp 13   Ht 5\' 3"  (1.6 m)   Wt 69.4 kg   LMP  (LMP Unknown)   SpO2 98%   BMI 27.10 kg/m  Physical Exam Vitals and nursing note reviewed.  Constitutional:      Appearance: Normal appearance.  HENT:     Head: Normocephalic and atraumatic.     Mouth/Throat:     Mouth: Mucous membranes are moist.  Eyes:     Pupils: Pupils are equal, round, and reactive to light.  Cardiovascular:     Rate and Rhythm: Normal rate.  Pulmonary:     Effort: Pulmonary effort is normal.  Abdominal:     General: Abdomen is flat.  Musculoskeletal:     Cervical back: Neck supple.     Right knee: Erythema present. No swelling, deformity or effusion. Tenderness present.     Comments: Slight bruising to the right knee. Tenderness with flexion and extension. 2+ DP,PT presents. Sensation intact.   Skin:    General: Skin is warm  and dry.  Neurological:     Mental Status: She is alert and oriented to person, place, and time.     ED Results / Procedures / Treatments   Labs (all labs ordered are listed, but only abnormal results are displayed) Labs Reviewed  CBC WITH DIFFERENTIAL/PLATELET - Abnormal; Notable for the following components:      Result Value   Hemoglobin 10.2 (*)    HCT 30.1 (*)    MCV 63.2 (*)    MCH 21.4 (*)    RDW 15.6 (*)    Monocytes Absolute 1.1 (*)    All other components within normal limits  COMPREHENSIVE METABOLIC PANEL - Abnormal; Notable for the following components:   Sodium 121 (*)    Chloride 88 (*)    Glucose, Bld 135 (*)    Calcium 8.4 (*)    Total Protein 6.2 (*)    All other components within normal limits    EKG None  Radiology DG Pelvis  Portable  Result Date: 11/24/2022 CLINICAL DATA:  Status post fall. EXAM: PORTABLE PELVIS 1-2 VIEWS COMPARISON:  None Available. FINDINGS: There is an acute right subcapital femoral neck fracture. Superior displacement of the distal fracture fragments noted. No sign of dislocation. Left hip appears intact and located. No signs of pelvic fracture or diastasis. IMPRESSION: Acute right subcapital femoral neck fracture. Electronically Signed   By: Signa Kell M.D.   On: 11/24/2022 07:22   DG Knee Right Port  Result Date: 11/24/2022 CLINICAL DATA:  Right knee pain.  Fall. EXAM: PORTABLE RIGHT KNEE - 1-2 VIEW COMPARISON:  None Available. FINDINGS: No significant joint effusion. No signs of acute fracture or dislocation. Moderate tricompartment joint space narrowing with marginal spur formation and sharpening of the tibial spines. Soft tissues appear unremarkable. IMPRESSION: 1. No acute findings. 2. Moderate tricompartment osteoarthritis. Electronically Signed   By: Signa Kell M.D.   On: 11/24/2022 07:01   DG Chest Portable 1 View  Result Date: 11/24/2022 CLINICAL DATA:  Fall.  Knee pain.  Weakness. EXAM: PORTABLE CHEST 1 VIEW COMPARISON:  None Available. FINDINGS: Normal heart size. Streaky opacities noted within the left base compatible with atelectasis versus scarring. No pleural effusion, and interstitial edema or pneumothorax. There is an acute fracture involving the humeral neck and greater tuberosity as noted on films from 11/20/2022. IMPRESSION: 1. Left base atelectasis versus scarring. 2. Acute fracture involving the left humeral neck and greater tuberosity. Electronically Signed   By: Signa Kell M.D.   On: 11/24/2022 06:59    Procedures Procedures    Medications Ordered in ED Medications  fentaNYL (SUBLIMAZE) injection 25 mcg (25 mcg Intravenous Given 11/24/22 0753)    ED Course/ Medical Decision Making/ A&P                             Medical Decision Making Amount and/or  Complexity of Data Reviewed Labs: ordered. Radiology: ordered.  Risk Prescription drug management.   This patient presents to the ED for concern of right knee pain, this involves a number of treatment options, and is a complaint that carries with it a high risk of complications and morbidity.  The differential diagnosis includes fracture, versus dislocation versus arthritis.    Co morbidities: Discussed in HPI   Brief History:  See HPI.   EMR reviewed including pt PMHx, past surgical history and past visits to ER.   See HPI for more details   Lab Tests:  I ordered and independently interpreted labs.  The pertinent results include:    CBC with no leukocytosis, hemoglobin is 10.2.  CMP with some hyponatremia, creatinine levels within normal limits. LFTs are normal.    Imaging Studies:  DG Right Knee: 1. No acute findings.  2. Moderate tricompartment osteoarthritis   DG Pelvis portable: Acute right subcapital femoral neck fracture.   Cardiac Monitoring:  The patient was maintained on a cardiac monitor.  I personally viewed and interpreted the cardiac monitored which showed an underlying rhythm of: NSR 82 EKG non-ischemic   Medicines ordered:  I ordered medication including fentanyl  for pain control Reevaluation of the patient after these medicines showed that the patient improved I have reviewed the patients home medicines and have made adjustments as needed   Consults:  I requested consultation with Dr. Linna Caprice 8:42 AM,  and discussed lab and imaging findings as well as pertinent plan - they recommend: admission will need to keep patient NPO, although unsure wether she will have surgery today due to OR staff.   Reevaluation:  After the interventions noted above I re-evaluated patient and found that they have :stayed the same   Social Determinants of Health:  The patient's social determinants of health were a factor in the care of this patient  Problem  List / ED Course:  Patient presents to the ED with a chief complaint of right knee pain via EMS.  Reports ambulating to get out of bed when suddenly she felt her right knee buckle, this is patient's second and almost fall in 2 days.  Evaluated in the emergency department on Monday after a mechanical fall.  Patient is currently at a facility and recently switched from independent living.  She is not anticoagulated, there was no loss of consciousness.  Reports pain especially with knee flexion along with right leg raise.  X-ray of her knee without any acute findings.  Pelvis x-ray does show a acute right femoral neck fracture.  Orthopedics Dr. Linna Caprice was consulted who is agreeable of admission via hospitalist service.  Will obtain labs.  Patient will need to be kept n.p.o. although she may not have surgery today on 11/24/2022 due to or staffing. Spoke to hospitalist Dr. Erenest Blank, who will admit patient for further management.  Patient remains hemodynamically stable.   Dispostion:  After consideration of the diagnostic results and the patients response to treatment, I feel that the patent would benefit from admission.     Portions of this note were generated with Scientist, clinical (histocompatibility and immunogenetics). Dictation errors may occur despite best attempts at proofreading.   Final Clinical Impression(s) / ED Diagnoses Final diagnoses:  Closed fracture of neck of right femur, initial encounter [facility])    Rx / DC Orders ED Discharge Orders     None         Claude Manges, PA-C 11/24/22 1610    Glynn Octave, MD 11/25/22 [phone]

## 2022-11-24 NOTE — ED Notes (Addendum)
Patient o2 sat decreased so she was placed on 2lpm o2 via nasal cannula

## 2022-11-24 NOTE — ED Triage Notes (Signed)
  Patient BIB EMS for R knee pain.  Patient states she was ambulating to restroom and R knee buckled.  Patient states she was in severe pain and needed help getting back in bed.  Patient states she did not fall or hit head.  Patient had fall Monday night and suffered fx humerus.  Patient given 50 mcg fentanyl en route.  Pain 2/10, R knee.

## 2022-11-25 ENCOUNTER — Encounter (HOSPITAL_COMMUNITY)
Admission: EM | Disposition: A | Discharge status: Skilled Nursing Facility | Payer: Self-pay | Source: Home / Self Care | Attending: Internal Medicine

## 2022-11-25 ENCOUNTER — Inpatient Hospital Stay (HOSPITAL_COMMUNITY): Payer: Medicare Other | Admitting: Anesthesiology

## 2022-11-25 ENCOUNTER — Other Ambulatory Visit: Payer: Self-pay

## 2022-11-25 ENCOUNTER — Inpatient Hospital Stay (HOSPITAL_COMMUNITY): Payer: Medicare Other

## 2022-11-25 ENCOUNTER — Encounter (HOSPITAL_COMMUNITY): Payer: Self-pay | Admitting: Internal Medicine

## 2022-11-25 DIAGNOSIS — E7800 Pure hypercholesterolemia, unspecified: Secondary | ICD-10-CM | POA: Diagnosis not present

## 2022-11-25 DIAGNOSIS — E871 Hypo-osmolality and hyponatremia: Secondary | ICD-10-CM | POA: Diagnosis not present

## 2022-11-25 DIAGNOSIS — M8000XA Age-related osteoporosis with current pathological fracture, unspecified site, initial encounter for fracture: Secondary | ICD-10-CM

## 2022-11-25 DIAGNOSIS — S72001A Fracture of unspecified part of neck of right femur, initial encounter for closed fracture: Secondary | ICD-10-CM | POA: Diagnosis not present

## 2022-11-25 DIAGNOSIS — D649 Anemia, unspecified: Secondary | ICD-10-CM

## 2022-11-25 DIAGNOSIS — S72041A Displaced fracture of base of neck of right femur, initial encounter for closed fracture: Secondary | ICD-10-CM | POA: Diagnosis not present

## 2022-11-25 DIAGNOSIS — R112 Nausea with vomiting, unspecified: Secondary | ICD-10-CM

## 2022-11-25 HISTORY — PX: TOTAL HIP ARTHROPLASTY: SHX124

## 2022-11-25 LAB — BASIC METABOLIC PANEL
Anion gap: 9 (ref 5–15)
BUN: 11 mg/dL (ref 8–23)
CO2: 27 mmol/L (ref 22–32)
Calcium: 7.9 mg/dL — ABNORMAL LOW (ref 8.9–10.3)
Chloride: 96 mmol/L — ABNORMAL LOW (ref 98–111)
Creatinine, Ser: 0.69 mg/dL (ref 0.44–1.00)
GFR, Estimated: >60 mL/min (ref >60)
Glucose, Bld: 97 mg/dL (ref 70–99)
Potassium: 3.4 mmol/L — ABNORMAL LOW (ref 3.5–5.1)
Sodium: 132 mmol/L — ABNORMAL LOW (ref 135–145)

## 2022-11-25 LAB — BPAM RBC

## 2022-11-25 LAB — TYPE AND SCREEN: ABO/RH(D): O NEG

## 2022-11-25 LAB — ABO/RH: ABO/RH(D): O NEG

## 2022-11-25 LAB — VITAMIN D 25 HYDROXY (VIT D DEFICIENCY, FRACTURES): Vit D, 25-Hydroxy: 26.71 ng/mL — ABNORMAL LOW (ref 30–100)

## 2022-11-25 LAB — PREPARE RBC (CROSSMATCH)

## 2022-11-25 LAB — MAGNESIUM: Magnesium: 1.8 mg/dL (ref 1.7–2.4)

## 2022-11-25 SURGERY — ARTHROPLASTY, HIP, TOTAL, ANTERIOR APPROACH
Anesthesia: Spinal | Site: Hip | Laterality: Right

## 2022-11-25 MED ORDER — DOCUSATE SODIUM 100 MG PO CAPS
100.0000 mg | ORAL_CAPSULE | Freq: Two times a day (BID) | ORAL | Status: DC
  Administered 2022-11-26 – 2022-11-27 (×2): 100 mg via ORAL
  Filled 2022-11-25 (×3): qty 1

## 2022-11-25 MED ORDER — ONDANSETRON HCL 4 MG/2ML IJ SOLN
INTRAMUSCULAR | Status: DC | PRN
  Administered 2022-11-25: 4 mg via INTRAVENOUS

## 2022-11-25 MED ORDER — BUPIVACAINE-EPINEPHRINE 0.25% -1:200000 IJ SOLN
INTRAMUSCULAR | Status: AC
  Filled 2022-11-25: qty 1

## 2022-11-25 MED ORDER — DEXAMETHASONE SODIUM PHOSPHATE 10 MG/ML IJ SOLN
INTRAMUSCULAR | Status: AC
  Filled 2022-11-25: qty 1

## 2022-11-25 MED ORDER — PROPOFOL 10 MG/ML IV BOLUS
INTRAVENOUS | Status: AC
  Filled 2022-11-25: qty 20

## 2022-11-25 MED ORDER — ACETAMINOPHEN 10 MG/ML IV SOLN
INTRAVENOUS | Status: DC | PRN
  Administered 2022-11-25: 1000 mg via INTRAVENOUS

## 2022-11-25 MED ORDER — KETOROLAC TROMETHAMINE 30 MG/ML IJ SOLN
INTRAMUSCULAR | Status: DC | PRN
  Administered 2022-11-25: 30 mg

## 2022-11-25 MED ORDER — ACETAMINOPHEN 10 MG/ML IV SOLN
INTRAVENOUS | Status: AC
  Filled 2022-11-25: qty 100

## 2022-11-25 MED ORDER — SODIUM CHLORIDE 0.9% IV SOLUTION: Freq: Once | INTRAVENOUS | Status: AC

## 2022-11-25 MED ORDER — 0.9 % SODIUM CHLORIDE (POUR BTL) OPTIME
TOPICAL | Status: DC | PRN
  Administered 2022-11-25: 1000 mL

## 2022-11-25 MED ORDER — HYDROCODONE-ACETAMINOPHEN 7.5-325 MG PO TABS
1.0000 | ORAL_TABLET | ORAL | Status: DC | PRN
  Administered 2022-11-26 (×2): 1 via ORAL
  Filled 2022-11-25 (×2): qty 1

## 2022-11-25 MED ORDER — VANCOMYCIN HCL 1000 MG IV SOLR
INTRAVENOUS | Status: DC | PRN
  Administered 2022-11-25: 1000 mg via TOPICAL

## 2022-11-25 MED ORDER — ROCURONIUM BROMIDE 10 MG/ML (PF) SYRINGE
PREFILLED_SYRINGE | INTRAVENOUS | Status: AC
  Filled 2022-11-25: qty 10

## 2022-11-25 MED ORDER — METHOCARBAMOL 500 MG IVPB - SIMPLE MED: 500.0000 mg | Freq: Four times a day (QID) | INTRAVENOUS | Status: DC | PRN

## 2022-11-25 MED ORDER — LIDOCAINE HCL (CARDIAC) PF 100 MG/5ML IV SOSY
PREFILLED_SYRINGE | INTRAVENOUS | Status: DC | PRN
  Administered 2022-11-25: 100 mg via INTRATRACHEAL

## 2022-11-25 MED ORDER — WATER FOR IRRIGATION, STERILE IR SOLN
Status: DC | PRN
  Administered 2022-11-25: 2000 mL

## 2022-11-25 MED ORDER — ACETAMINOPHEN 500 MG PO TABS
500.0000 mg | ORAL_TABLET | Freq: Four times a day (QID) | ORAL | Status: AC
  Administered 2022-11-25 (×2): 500 mg via ORAL
  Filled 2022-11-25 (×2): qty 1

## 2022-11-25 MED ORDER — SODIUM CHLORIDE (PF) 0.9 % IJ SOLN
INTRAMUSCULAR | Status: AC
  Filled 2022-11-25: qty 50

## 2022-11-25 MED ORDER — POVIDONE-IODINE 10 % EX SWAB: 2.0000 | Freq: Once | CUTANEOUS | Status: DC

## 2022-11-25 MED ORDER — CHLORHEXIDINE GLUCONATE 4 % EX SOLN
60.0000 mL | Freq: Once | CUTANEOUS | Status: AC
  Administered 2022-11-25: 4 via TOPICAL
  Filled 2022-11-25: qty 60

## 2022-11-25 MED ORDER — SODIUM CHLORIDE (PF) 0.9 % IJ SOLN
INTRAMUSCULAR | Status: DC | PRN
  Administered 2022-11-25: 30 mL

## 2022-11-25 MED ORDER — FENTANYL CITRATE PF 50 MCG/ML IJ SOSY: 25.0000 ug | PREFILLED_SYRINGE | INTRAMUSCULAR | Status: DC | PRN

## 2022-11-25 MED ORDER — FENTANYL CITRATE (PF) 100 MCG/2ML IJ SOLN
INTRAMUSCULAR | Status: AC
  Filled 2022-11-25: qty 2

## 2022-11-25 MED ORDER — ASPIRIN 81 MG PO CHEW
81.0000 mg | CHEWABLE_TABLET | Freq: Two times a day (BID) | ORAL | Status: DC
  Administered 2022-11-25 – 2022-11-27 (×4): 81 mg via ORAL
  Filled 2022-11-25 (×4): qty 1

## 2022-11-25 MED ORDER — TRANEXAMIC ACID-NACL 1000-0.7 MG/100ML-% IV SOLN
INTRAVENOUS | Status: AC
  Filled 2022-11-25: qty 100

## 2022-11-25 MED ORDER — METHOCARBAMOL 500 MG PO TABS
500.0000 mg | ORAL_TABLET | Freq: Four times a day (QID) | ORAL | Status: DC | PRN
  Administered 2022-11-26: 500 mg via ORAL
  Filled 2022-11-25: qty 1

## 2022-11-25 MED ORDER — PHENOL 1.4 % MT LIQD: 1.0000 | OROMUCOSAL | Status: DC | PRN

## 2022-11-25 MED ORDER — ISOPROPYL ALCOHOL 70 % SOLN
Status: DC | PRN
  Administered 2022-11-25: 1 via TOPICAL

## 2022-11-25 MED ORDER — ALUM & MAG HYDROXIDE-SIMETH 200-200-20 MG/5ML PO SUSP: 30.0000 mL | ORAL | Status: DC | PRN

## 2022-11-25 MED ORDER — ACETAMINOPHEN 325 MG PO TABS
325.0000 mg | ORAL_TABLET | Freq: Four times a day (QID) | ORAL | Status: DC | PRN
  Administered 2022-11-26: 650 mg via ORAL
  Filled 2022-11-25: qty 2

## 2022-11-25 MED ORDER — CEFAZOLIN SODIUM-DEXTROSE 2-4 GM/100ML-% IV SOLN
INTRAVENOUS | Status: AC
  Filled 2022-11-25: qty 100

## 2022-11-25 MED ORDER — CEFAZOLIN SODIUM-DEXTROSE 2-4 GM/100ML-% IV SOLN
2.0000 g | INTRAVENOUS | Status: AC
  Administered 2022-11-25: 2 g via INTRAVENOUS

## 2022-11-25 MED ORDER — TRANEXAMIC ACID-NACL 1000-0.7 MG/100ML-% IV SOLN
1000.0000 mg | INTRAVENOUS | Status: AC
  Administered 2022-11-25: 1000 mg via INTRAVENOUS

## 2022-11-25 MED ORDER — PROPOFOL 500 MG/50ML IV EMUL
INTRAVENOUS | Status: DC | PRN
  Administered 2022-11-25: 35 ug/kg/min via INTRAVENOUS

## 2022-11-25 MED ORDER — MORPHINE SULFATE (PF) 2 MG/ML IV SOLN: 0.5000 mg | INTRAVENOUS | Status: DC | PRN

## 2022-11-25 MED ORDER — POTASSIUM CHLORIDE CRYS ER 20 MEQ PO TBCR
40.0000 meq | EXTENDED_RELEASE_TABLET | Freq: Once | ORAL | Status: AC
  Administered 2022-11-25: 40 meq via ORAL
  Filled 2022-11-25: qty 2

## 2022-11-25 MED ORDER — LACTATED RINGERS IV SOLN: INTRAVENOUS | Status: DC | PRN

## 2022-11-25 MED ORDER — ONDANSETRON HCL 4 MG/2ML IJ SOLN: 4.0000 mg | Freq: Four times a day (QID) | INTRAMUSCULAR | Status: DC | PRN

## 2022-11-25 MED ORDER — METOCLOPRAMIDE HCL 5 MG/ML IJ SOLN: 5.0000 mg | Freq: Three times a day (TID) | INTRAMUSCULAR | Status: DC | PRN

## 2022-11-25 MED ORDER — BUPIVACAINE-EPINEPHRINE 0.25% -1:200000 IJ SOLN
INTRAMUSCULAR | Status: DC | PRN
  Administered 2022-11-25: 30 mL

## 2022-11-25 MED ORDER — ONDANSETRON HCL 4 MG PO TABS: 4.0000 mg | ORAL_TABLET | Freq: Four times a day (QID) | ORAL | Status: DC | PRN

## 2022-11-25 MED ORDER — DIPHENHYDRAMINE HCL 12.5 MG/5ML PO ELIX: 12.5000 mg | ORAL_SOLUTION | ORAL | Status: DC | PRN

## 2022-11-25 MED ORDER — FENTANYL CITRATE (PF) 100 MCG/2ML IJ SOLN
INTRAMUSCULAR | Status: DC | PRN
  Administered 2022-11-25 (×2): 50 ug via INTRAVENOUS

## 2022-11-25 MED ORDER — CEFAZOLIN SODIUM-DEXTROSE 2-4 GM/100ML-% IV SOLN
2.0000 g | Freq: Four times a day (QID) | INTRAVENOUS | Status: AC
  Administered 2022-11-25 (×2): 2 g via INTRAVENOUS
  Filled 2022-11-25 (×2): qty 100

## 2022-11-25 MED ORDER — PHENYLEPHRINE HCL-NACL 20-0.9 MG/250ML-% IV SOLN
INTRAVENOUS | Status: AC
  Filled 2022-11-25: qty 250

## 2022-11-25 MED ORDER — ONDANSETRON HCL 4 MG/2ML IJ SOLN
INTRAMUSCULAR | Status: AC
  Filled 2022-11-25: qty 2

## 2022-11-25 MED ORDER — SODIUM CHLORIDE 0.9 % IR SOLN
Status: DC | PRN
  Administered 2022-11-25: 3000 mL

## 2022-11-25 MED ORDER — LIDOCAINE HCL (PF) 2 % IJ SOLN
INTRAMUSCULAR | Status: AC
  Filled 2022-11-25: qty 5

## 2022-11-25 MED ORDER — POLYETHYLENE GLYCOL 3350 17 G PO PACK: 17.0000 g | PACK | Freq: Every day | ORAL | Status: DC | PRN

## 2022-11-25 MED ORDER — DEXAMETHASONE SODIUM PHOSPHATE 10 MG/ML IJ SOLN
INTRAMUSCULAR | Status: DC | PRN
  Administered 2022-11-25: 6 mg via INTRAVENOUS

## 2022-11-25 MED ORDER — BUPIVACAINE IN DEXTROSE 0.75-8.25 % IT SOLN
INTRATHECAL | Status: DC | PRN
  Administered 2022-11-25: 1.6 mL via INTRATHECAL

## 2022-11-25 MED ORDER — KETOROLAC TROMETHAMINE 30 MG/ML IJ SOLN
INTRAMUSCULAR | Status: AC
  Filled 2022-11-25: qty 1

## 2022-11-25 MED ORDER — KETAMINE HCL 10 MG/ML IJ SOLN
INTRAMUSCULAR | Status: DC | PRN
  Administered 2022-11-25: 30 mg via INTRAVENOUS

## 2022-11-25 MED ORDER — METOCLOPRAMIDE HCL 5 MG PO TABS: 5.0000 mg | ORAL_TABLET | Freq: Three times a day (TID) | ORAL | Status: DC | PRN

## 2022-11-25 MED ORDER — HYDROCODONE-ACETAMINOPHEN 5-325 MG PO TABS
1.0000 | ORAL_TABLET | ORAL | Status: DC | PRN
  Administered 2022-11-26: 2 via ORAL
  Administered 2022-11-27: 1 via ORAL
  Filled 2022-11-25: qty 2
  Filled 2022-11-25: qty 1

## 2022-11-25 MED ORDER — KETAMINE HCL 50 MG/5ML IJ SOSY
PREFILLED_SYRINGE | INTRAMUSCULAR | Status: AC
  Filled 2022-11-25: qty 5

## 2022-11-25 MED ORDER — ISOPROPYL ALCOHOL 70 % SOLN
Status: AC
  Filled 2022-11-25: qty 480

## 2022-11-25 MED ORDER — ACETAMINOPHEN 500 MG PO TABS: 1000.0000 mg | ORAL_TABLET | Freq: Once | ORAL | Status: DC

## 2022-11-25 MED ORDER — SENNA 8.6 MG PO TABS
1.0000 | ORAL_TABLET | Freq: Two times a day (BID) | ORAL | Status: DC
  Administered 2022-11-25 – 2022-11-27 (×3): 8.6 mg via ORAL
  Filled 2022-11-25 (×3): qty 1

## 2022-11-25 MED ORDER — PROPOFOL 1000 MG/100ML IV EMUL
INTRAVENOUS | Status: AC
  Filled 2022-11-25: qty 100

## 2022-11-25 MED ORDER — MENTHOL 3 MG MT LOZG
1.0000 | LOZENGE | OROMUCOSAL | Status: DC | PRN
  Administered 2022-11-26: 3 mg via ORAL
  Filled 2022-11-25: qty 9

## 2022-11-25 MED ORDER — PHENYLEPHRINE HCL-NACL 20-0.9 MG/250ML-% IV SOLN
INTRAVENOUS | Status: DC | PRN
  Administered 2022-11-25: 50 ug/min via INTRAVENOUS

## 2022-11-25 MED ORDER — VANCOMYCIN HCL 1000 MG IV SOLR
INTRAVENOUS | Status: AC
  Filled 2022-11-25: qty 20

## 2022-11-25 SURGICAL SUPPLY — 57 items
ACE SHELL 3H 52 E HIP (Shell) ×1 IMPLANT
ADH SKN CLS APL DERMABOND .7 (GAUZE/BANDAGES/DRESSINGS) ×2
APL PRP STRL LF DISP 70% ISPRP (MISCELLANEOUS) ×1
BAG COUNTER SPONGE SURGICOUNT (BAG) IMPLANT
BAG DECANTER FOR FLEXI CONT (MISCELLANEOUS) IMPLANT
BAG SPEC THK2 15X12 ZIP CLS (MISCELLANEOUS)
BAG SPNG CNTER NS LX DISP (BAG)
BAG ZIPLOCK 12X15 (MISCELLANEOUS) IMPLANT
CHLORAPREP W/TINT 26 (MISCELLANEOUS) ×1 IMPLANT
COVER PERINEAL POST (MISCELLANEOUS) ×1 IMPLANT
COVER SURGICAL LIGHT HANDLE (MISCELLANEOUS) ×1 IMPLANT
DERMABOND ADVANCED .7 DNX12 (GAUZE/BANDAGES/DRESSINGS) ×2 IMPLANT
DRAPE IMP U-DRAPE 54X76 (DRAPES) ×1 IMPLANT
DRAPE SHEET LG 3/4 BI-LAMINATE (DRAPES) ×3 IMPLANT
DRAPE STERI IOBAN 125X83 (DRAPES) ×1 IMPLANT
DRAPE U-SHAPE 47X51 STRL (DRAPES) ×1 IMPLANT
DRSG AQUACEL AG ADV 3.5X10 (GAUZE/BANDAGES/DRESSINGS) ×1 IMPLANT
ELECT REM PT RETURN 15FT ADLT (MISCELLANEOUS) ×1 IMPLANT
GAUZE SPONGE 4X4 12PLY STRL (GAUZE/BANDAGES/DRESSINGS) ×1 IMPLANT
GLOVE BIO SURGEON STRL SZ7 (GLOVE) ×1 IMPLANT
GLOVE BIO SURGEON STRL SZ8.5 (GLOVE) ×2 IMPLANT
GLOVE BIOGEL PI IND STRL 7.5 (GLOVE) ×1 IMPLANT
GLOVE BIOGEL PI IND STRL 8.5 (GLOVE) ×1 IMPLANT
GOWN SPEC L3 XXLG W/TWL (GOWN DISPOSABLE) ×1 IMPLANT
GOWN STRL REUS W/ TWL XL LVL3 (GOWN DISPOSABLE) ×1 IMPLANT
GOWN STRL REUS W/TWL XL LVL3 (GOWN DISPOSABLE) ×1
HANDPIECE INTERPULSE COAX TIP (DISPOSABLE) ×1
HEAD FEM -3XOFST 36XMDLR (Head) IMPLANT
HEAD MODULAR 36MM (Head) ×1 IMPLANT
HOLDER FOLEY CATH W/STRAP (MISCELLANEOUS) ×1 IMPLANT
HOOD PEEL AWAY T7 (MISCELLANEOUS) ×3 IMPLANT
KIT TURNOVER KIT A (KITS) IMPLANT
LINER ACE G7 HIGH 36 SZ E (Liner) IMPLANT
MANIFOLD NEPTUNE II (INSTRUMENTS) ×1 IMPLANT
MARKER SKIN DUAL TIP RULER LAB (MISCELLANEOUS) ×1 IMPLANT
NDL SAFETY ECLIP 18X1.5 (MISCELLANEOUS) ×1 IMPLANT
NDL SPNL 18GX3.5 QUINCKE PK (NEEDLE) ×1 IMPLANT
NEEDLE SPNL 18GX3.5 QUINCKE PK (NEEDLE) ×1 IMPLANT
PACK ANTERIOR HIP CUSTOM (KITS) ×1 IMPLANT
SAW OSC TIP CART 19.5X105X1.3 (SAW) ×1 IMPLANT
SEALER BIPOLAR AQUA 6.0 (INSTRUMENTS) ×1 IMPLANT
SET HNDPC FAN SPRY TIP SCT (DISPOSABLE) ×1 IMPLANT
SHELL ACETAB 3H 52 E HIP (Shell) IMPLANT
SOLUTION PRONTOSAN WOUND 350ML (IRRIGATION / IRRIGATOR) ×1 IMPLANT
SPIKE FLUID TRANSFER (MISCELLANEOUS) ×1 IMPLANT
STEM FEM CMTLS TL 12X144 123D (Stem) IMPLANT
SUT MNCRL AB 3-0 PS2 18 (SUTURE) ×1 IMPLANT
SUT MON AB 2-0 CT1 36 (SUTURE) ×1 IMPLANT
SUT STRATAFIX PDO 1 14 VIOLET (SUTURE) ×1
SUT STRATFX PDO 1 14 VIOLET (SUTURE) ×1
SUT VIC AB 2-0 CT1 27 (SUTURE)
SUT VIC AB 2-0 CT1 TAPERPNT 27 (SUTURE) IMPLANT
SUTURE STRATFX PDO 1 14 VIOLET (SUTURE) ×1 IMPLANT
SYR 3ML LL SCALE MARK (SYRINGE) ×1 IMPLANT
TRAY FOLEY MTR SLVR 16FR STAT (SET/KITS/TRAYS/PACK) IMPLANT
TUBE SUCTION HIGH CAP CLEAR NV (SUCTIONS) ×1 IMPLANT
WATER STERILE IRR 1000ML POUR (IV SOLUTION) ×1 IMPLANT

## 2022-11-25 NOTE — Anesthesia Procedure Notes (Signed)
Date/Time: 11/25/2022 10:23 AM  Performed by: Florene Route, CRNAOxygen Delivery Method: Simple face mask

## 2022-11-25 NOTE — Op Note (Addendum)
OPERATIVE REPORT  SURGEON: Samson Frederic, MD   ASSISTANT: Arther Abbott, PA-C  PREOPERATIVE DIAGNOSIS: Displaced Right femoral neck fracture.   POSTOPERATIVE DIAGNOSIS: Displaced Right femoral neck fracture.   PROCEDURE: Right total hip arthroplasty, anterior approach.   IMPLANTS: Biomet Taperloc Reduced Distal stem, size 12 x 144 mm, standard offset. Biomet G7 OsseoTi Cup, size 52 mm. Biomet Vivacit-E liner, size 36 mm, E, neutral. Biomet metal head ball, size 36 - 3 mm.  ANESTHESIA:  MAC and Spinal  ANTIBIOTICS: 2g ancef.  ESTIMATED BLOOD LOSS:-400 mL    DRAINS: None.  COMPLICATIONS: None   CONDITION: PACU - hemodynamically stable.   BRIEF CLINICAL NOTE: Abigail Shah is a 82 y.o. female with a displaced Right femoral neck fracture. The patient was admitted to the hospitalist service and underwent perioperative risk stratification and medical optimization. The risks, benefits, and alternatives to total hip arthroplasty were explained, and the patient elected to proceed.  PROCEDURE IN DETAIL: The patient was taken to the operating room and general anesthesia was induced on the hospital bed.  The patient was then positioned on the Hana table.  All bony prominences were well padded.  The hip was prepped and draped in the normal sterile surgical fashion.  A time-out was called verifying side and site of surgery. Antibiotics were given within 60 minutes of beginning the procedure.   Bikini incision was made, and the direct anterior approach to the hip was performed through the Hueter interval.  Lateral femoral circumflex vessels were treated with the Auqumantys. The anterior capsule was exposed and an inverted T capsulotomy was made.  Fracture hematoma was encountered and evacuated. The patient was found to have a comminuted Right subcapital femoral neck fracture.  I freshened the femoral neck cut with a saw.  I removed the femoral neck fragment.  A corkscrew was placed into the  head and the head was removed.  This was passed to the back table and was measured. The pubofemoral ligament was released subperiosteally to the lesser trochanter.  Acetabular exposure was achieved, and the pulvinar and labrum were excised. Sequential reaming of the acetabulum was then performed up to a size 51 mm reamer under direct visulization. A 52 mm cup was then opened and impacted into place at approximately 40 degrees of abduction and 20 degrees of anteversion. The final polyethylene liner was impacted into place and acetabular osteophytes were removed.    I then gained femoral exposure taking care to protect the abductors and greater trochanter.  This was performed using standard external rotation, extension, and adduction.  A cookie cutter was used to enter the femoral canal, and then the femoral canal finder was placed.  Sequential broaching was performed up to a size 12.  Calcar planer was used on the femoral neck remnant.  I placed a standard offset neck and a trial head ball.  The hip was reduced.  Leg lengths and offset were checked fluoroscopically.  The hip was dislocated and trial components were removed.  The final implants were placed, and the hip was reduced.  Fluoroscopy was used to confirm component position and leg lengths.  At 90 degrees of external rotation and full extension, the hip was stable to an anterior directed force.   The wound was copiously irrigated with Irrisept solution and normal saline using pule lavage.  Marcaine solution was injected into the periarticular soft tissue.  The wound was closed in layers using #1 Stratafix for the fascia, 2-0 Vicryl for the subcutaneous fat,  2-0 Monocryl for the deep dermal layer, 3-0 running Monocryl subcuticular stitch, and Dermabond for the skin.  Once the glue was fully dried, an Aquacell Ag dressing was applied.  The patient was transported to the recovery room in stable condition.  Sponge, needle, and instrument counts were correct  at the end of the case x2.  The patient tolerated the procedure well and there were no known complications.  The aquamantis was utilized for this case to help facilitate better hemostasis as patient was felt to be at increased risk of bleeding because of preop anemia.  A oscillating saw tip was utilized for this case to prevent damage to the soft tissue structures such as muscles, ligaments and tendons, and to ensure accurate bone cuts. This patient was at increased risk for above structures due to minimally invasive approach.  Please note that a surgical assistant was a medical necessity for this procedure to perform it in a safe and expeditious manner. Assistant was necessary to provide appropriate retraction of vital neurovascular structures, to prevent femoral fracture, and to allow for anatomic placement of the prosthesis.

## 2022-11-25 NOTE — Anesthesia Preprocedure Evaluation (Addendum)
Anesthesia Evaluation  Patient identified by MRN, date of birth, ID band Patient awake    Reviewed: Allergy & Precautions, H&P , NPO status , Patient's Chart, lab work & pertinent test results  Airway Mallampati: II  TM Distance: >3 FB Neck ROM: Full    Dental no notable dental hx. (+) Teeth Intact, Dental Advisory Given   Pulmonary neg pulmonary ROS   Pulmonary exam normal breath sounds clear to auscultation       Cardiovascular negative cardio ROS  Rhythm:Regular Rate:Normal     Neuro/Psych negative neurological ROS  negative psych ROS   GI/Hepatic negative GI ROS, Neg liver ROS,,,  Endo/Other  negative endocrine ROS    Renal/GU negative Renal ROS  negative genitourinary   Musculoskeletal   Abdominal   Peds  Hematology  (+) Blood dyscrasia, anemia   Anesthesia Other Findings   Reproductive/Obstetrics negative OB ROS                             Anesthesia Physical Anesthesia Plan  ASA: 2  Anesthesia Plan: Spinal   Post-op Pain Management: Ofirmev IV (intra-op)*   Induction: Intravenous  PONV Risk Score and Plan: 3 and Ondansetron, Dexamethasone and Propofol infusion  Airway Management Planned: Natural Airway and Simple Face Mask  Additional Equipment:   Intra-op Plan:   Post-operative Plan:   Informed Consent: I have reviewed the patients History and Physical, chart, labs and discussed the procedure including the risks, benefits and alternatives for the proposed anesthesia with the patient or authorized representative who has indicated his/her understanding and acceptance.     Dental advisory given  Plan Discussed with: CRNA  Anesthesia Plan Comments:        Anesthesia Quick Evaluation

## 2022-11-25 NOTE — Plan of Care (Signed)
  Problem: Activity: Goal: Risk for activity intolerance will decrease Outcome: Progressing   Problem: Nutrition: Goal: Adequate nutrition will be maintained Outcome: Progressing   Problem: Pain Managment: Goal: General experience of comfort will improve Outcome: Progressing   

## 2022-11-25 NOTE — Progress Notes (Signed)
[facility]                                                                              Abigail Shah, is a 82 y.o. female, DOB - Nov 24, 1940, ZOX:[id number] Admit date - 11/24/2022    Outpatient Primary MD for the patient is Durene Cal, Aldine Contes, MD  LOS - 1  days  Chief Complaint  Patient presents with   Knee Pain       Brief summary   Patient is a 82 year old female with osteopenia, HLP presented with multiple falls, complaining of right knee pain.  Patient was seen in the ED on 6/3 for fall at home resulting in proximal right humeral fracture.  Patient saw orthopedics outpatient the next day.  At night before the admission, she was being assisted into her wheelchair and her right knee buckled, she fell backwards into the wheelchair.  Patient continued to complain of severe right knee pain pretty severe right knee pain. Imaging showed subcapital acute right femoral neck fracture.  Right knee imaging showed no fracture.  Orthopedics was consulted.  Assessment & Plan    Principal Problem:   Closed right hip fracture, initial encounter [facility]), recent multiple falls -N.p.o., orthopedics consulted, plan for or today -Pain control, DVT prophylaxis per orthopedics -No acute issues  Active Problems:   Hyperlipidemia -Continue statin  Osteopenia -Follow vitamin D level   Acute hyponatremia -Likely hypovolemic hyponatremia in the setting of reduced oral intake and recent vomiting -Sodium 121 on admission, serum osmolarity 265 -improved to 132 this morning. -Continue gentle hydration  Hypokalemia -Replaced   Estimated body mass index is 27.1 kg/m as calculated from the following:   Height as of this encounter: 5\' 3"  (1.6 m).   Weight as of this encounter: 69.4 kg.  Code Status: Full CODE STATUS DVT Prophylaxis:  SCDs Start: 11/24/22 1201   Level of Care: Level of care: Med-Surg Family Communication: Updated patient Disposition Plan:      Remains  inpatient appropriate: Pending OR today   Procedures:    Consultants:   Orthopedics  Antimicrobials:   Anti-infectives (From admission, onward)    Start     Dose/Rate Route Frequency Ordered Stop   11/25/22 0915  ceFAZolin (ANCEF) IVPB 2g/100 mL premix        2 g 200 mL/hr over 30 Minutes Intravenous On call to O.R. 11/25/22 0503 11/26/22 0559   11/25/22 0806  ceFAZolin (ANCEF) 2-4 GM/100ML-% IVPB       Note to Pharmacy: Norva Pavlov: cabinet override      11/25/22 0806 11/25/22 2014          Medications  acetaminophen  1,000 mg Oral Once   [MAR Hold] atorvastatin  10 mg Oral QPM   [MAR Hold] docusate sodium  100 mg Oral BID   povidone-iodine  2 Application Topical Once      Subjective:   Abigail Shah was seen and examined today am.  No acute complaints by the patient, currently no hip pain, awaiting surgery at the time of my encounter.  States nausea vomiting better this morning.  Patient denies dizziness, chest pain, shortness of  breath, abdominal pain. Objective:   Vitals:   11/24/22 2131 11/25/22 0147 11/25/22 0555 11/25/22 0930  BP: (!) 143/59 132/64 119/61 112/61  Pulse: 86 81 75 79  Resp: 16 16 16 16   Temp: 97.7 F (36.5 C) 97.8 F (36.6 C) (!) 97.3 F (36.3 C) 98.3 F (36.8 C)  TempSrc: Oral Oral Oral   SpO2: 98% 98% 98% 99%  Weight:      Height:        Intake/Output Summary (Last 24 hours) at 11/25/2022 0952 Last data filed at 11/25/2022 0931 Gross per 24 hour  Intake 2180.71 ml  Output 1750 ml  Net 430.71 ml     Wt Readings from Last 3 Encounters:  11/24/22 69.4 kg  11/20/22 69.4 kg  04/25/22 73.1 kg     Exam General: Alert and oriented x 3, NAD Cardiovascular: S1 S2 auscultated,  RRR Respiratory: Clear to auscultation bilaterally Gastrointestinal: Soft, nontender, nondistended, + bowel sounds Ext: no pedal edema bilaterally Neuro: no new deficits Psych: Normal affect     Data Reviewed:  I have personally reviewed following  labs    CBC Lab Results  Component Value Date   WBC 9.5 11/24/2022   RBC 4.76 11/24/2022   HGB 10.2 (L) 11/24/2022   HCT 30.1 (L) 11/24/2022   MCV 63.2 (L) 11/24/2022   MCH 21.4 (L) 11/24/2022   PLT 183 11/24/2022   MCHC 33.9 11/24/2022   RDW 15.6 (H) 11/24/2022   LYMPHSABS 1.4 11/24/2022   MONOABS 1.1 (H) 11/24/2022   EOSABS 0.0 11/24/2022   BASOSABS 0.0 11/24/2022     Last metabolic panel Lab Results  Component Value Date   NA 132 (L) 11/25/2022   K 3.4 (L) 11/25/2022   CL 96 (L) 11/25/2022   CO2 27 11/25/2022   BUN 11 11/25/2022   CREATININE 0.69 11/25/2022   GLUCOSE 97 11/25/2022   GFRNONAA >60 11/25/2022   GFRAA 76 04/16/2020   CALCIUM 7.9 (L) 11/25/2022   PROT 6.2 (L) 11/24/2022   ALBUMIN 3.8 11/24/2022   BILITOT 0.9 11/24/2022   ALKPHOS 45 11/24/2022   AST 27 11/24/2022   ALT 25 11/24/2022   ANIONGAP 9 11/25/2022    CBG (last 3)  No results for input(s): "GLUCAP" in the last 72 hours.    Coagulation Profile: No results for input(s): "INR", "PROTIME" in the last 168 hours.   Radiology Studies: I have personally reviewed the imaging studies  CT KNEE RIGHT WO CONTRAST  Result Date: 11/24/2022 CLINICAL DATA:  Fall, right femoral neck fracture, clinical concern for knee fracture occult on radiography. EXAM: CT OF THE RIGHT KNEE WITHOUT CONTRAST TECHNIQUE: Multidetector CT imaging of the right knee was performed according to the standard protocol. Multiplanar CT image reconstructions were also generated. RADIATION DOSE REDUCTION: This exam was performed according to the departmental dose-optimization program which includes automated exposure control, adjustment of the mA and/or kV according to patient size and/or use of iterative reconstruction technique. COMPARISON:  Radiograph 11/24/2022 FINDINGS: Bones/Joint/Cartilage Tricompartmental osteoarthritis with subcortical cyst formation and marginal spurring. Well corticated chronically fragmented lateral patellar  spurs. No acute fracture identified. No knee joint effusion to further suggest occult fracture. Ligaments Suboptimally assessed by CT. Muscles and Tendons Unremarkable Soft tissues Mild prepatellar subcutaneous edema. IMPRESSION: 1. No acute fracture identified. 2. Tricompartmental osteoarthritis. 3. Mild prepatellar subcutaneous edema. Electronically Signed   By: Gaylyn Rong M.D.   On: 11/24/2022 11:14   DG Pelvis Portable  Result Date: 11/24/2022 CLINICAL DATA:  Status  post fall. EXAM: PORTABLE PELVIS 1-2 VIEWS COMPARISON:  None Available. FINDINGS: There is an acute right subcapital femoral neck fracture. Superior displacement of the distal fracture fragments noted. No sign of dislocation. Left hip appears intact and located. No signs of pelvic fracture or diastasis. IMPRESSION: Acute right subcapital femoral neck fracture. Electronically Signed   By: Signa Kell M.D.   On: 11/24/2022 07:22   DG Knee Right Port  Result Date: 11/24/2022 CLINICAL DATA:  Right knee pain.  Fall. EXAM: PORTABLE RIGHT KNEE - 1-2 VIEW COMPARISON:  None Available. FINDINGS: No significant joint effusion. No signs of acute fracture or dislocation. Moderate tricompartment joint space narrowing with marginal spur formation and sharpening of the tibial spines. Soft tissues appear unremarkable. IMPRESSION: 1. No acute findings. 2. Moderate tricompartment osteoarthritis. Electronically Signed   By: Signa Kell M.D.   On: 11/24/2022 07:01   DG Chest Portable 1 View  Result Date: 11/24/2022 CLINICAL DATA:  Fall.  Knee pain.  Weakness. EXAM: PORTABLE CHEST 1 VIEW COMPARISON:  None Available. FINDINGS: Normal heart size. Streaky opacities noted within the left base compatible with atelectasis versus scarring. No pleural effusion, and interstitial edema or pneumothorax. There is an acute fracture involving the humeral neck and greater tuberosity as noted on films from 11/20/2022. IMPRESSION: 1. Left base atelectasis versus  scarring. 2. Acute fracture involving the left humeral neck and greater tuberosity. Electronically Signed   By: Signa Kell M.D.   On: 11/24/2022 06:59       Jordin Vicencio M.D. [facility] 11/25/2022, 9:52 AM  Available via Epic secure chat 7am-7pm After 7 pm, please refer to night coverage provider listed on amion.

## 2022-11-25 NOTE — Anesthesia Procedure Notes (Signed)
Spinal  Patient location during procedure: OR Start time: 11/25/2022 10:25 AM End time: 11/25/2022 10:32 AM Reason for block: surgical anesthesia Staffing Performed: anesthesiologist  Anesthesiologist: Gaynelle Adu, MD Performed by: Gaynelle Adu, MD Authorized by: Gaynelle Adu, MD   Preanesthetic Checklist Completed: patient identified, IV checked, risks and benefits discussed, surgical consent, monitors and equipment checked, pre-op evaluation and timeout performed Spinal Block Patient position: left lateral decubitus Prep: DuraPrep Patient monitoring: cardiac monitor, continuous pulse ox and blood pressure Approach: midline Location: L3-4 Injection technique: single-shot Needle Needle type: Quincke  Needle gauge: 22 G Needle length: 9 cm Assessment Sensory level: T8 Events: CSF return Additional Notes Functioning IV was confirmed and monitors were applied. Sterile prep and drape, including hand hygiene and sterile gloves were used. The patient was positioned and the spine was prepped. The skin was anesthetized with lidocaine.  Free flow of clear CSF was obtained prior to injecting local anesthetic into the CSF.  The spinal needle aspirated freely following injection.  The needle was carefully withdrawn.  The patient tolerated the procedure well.

## 2022-11-25 NOTE — Interval H&P Note (Signed)
History and Physical Interval Note:  11/25/2022 10:10 AM  Abigail Shah  has presented today for surgery, with the diagnosis of RIGHT FEMORAL NECK FRACTURE.  The various methods of treatment have been discussed with the patient and family. After consideration of risks, benefits and other options for treatment, the patient has consented to  Procedure(s): TOTAL HIP ARTHROPLASTY ANTERIOR APPROACH (Right) as a surgical intervention.  The patient's history has been reviewed, patient examined, no change in status, stable for surgery.  I have reviewed the patient's chart and labs.  Questions were answered to the patient's satisfaction.    The risks, benefits, and alternatives were discussed with the patient. There are risks associated with the surgery including, but not limited to, problems with anesthesia (death), infection, instability (giving out of the joint), dislocation, differences in leg length/angulation/rotation, fracture of bones, loosening or failure of implants, hematoma (blood accumulation) which may require surgical drainage, blood clots, pulmonary embolism, nerve injury (foot drop and lateral thigh numbness), and blood vessel injury. The patient understands these risks and elects to proceed.    Abigail Shah

## 2022-11-25 NOTE — Anesthesia Postprocedure Evaluation (Signed)
Anesthesia Post Note  Patient: CIELO ARIAS  Procedure(s) Performed: TOTAL HIP ARTHROPLASTY ANTERIOR APPROACH (Right: Hip)     Patient location during evaluation: PACU Anesthesia Type: Spinal Level of consciousness: oriented and awake and alert Pain management: pain level controlled Vital Signs Assessment: post-procedure vital signs reviewed and stable Respiratory status: spontaneous breathing and respiratory function stable Cardiovascular status: blood pressure returned to baseline and stable Postop Assessment: no headache, no backache, no apparent nausea or vomiting, spinal receding and patient able to bend at knees Anesthetic complications: no  No notable events documented.  Last Vitals:  Vitals:   11/25/22 1310 11/25/22 1321  BP: (!) 152/58 128/62  Pulse: 77 82  Resp: 12 16  Temp:  36.7 C  SpO2: 97%     Last Pain:  Vitals:   11/25/22 1321  TempSrc: Oral  PainSc:     LLE Motor Response: Purposeful movement;Responds to commands (11/25/22 1310) LLE Sensation: Full sensation (11/25/22 1310) RLE Motor Response: Purposeful movement;Responds to commands (11/25/22 1310) RLE Sensation: Numbness;Tingling (11/25/22 1310) L Sensory Level: S1-Sole of foot, small toes (11/25/22 1310) R Sensory Level: S1-Sole of foot, small toes (11/25/22 1310)  Bellina Tokarczyk,W. EDMOND

## 2022-11-25 NOTE — Plan of Care (Signed)
  Problem: Education: Goal: Knowledge of General Education information will improve Description: Including pain rating scale, medication(s)/side effects and non-pharmacologic comfort measures Outcome: Progressing   Problem: Health Behavior/Discharge Planning: Goal: Ability to manage health-related needs will improve Outcome: Progressing   Problem: Clinical Measurements: Goal: Ability to maintain clinical measurements within normal limits will improve Outcome: Progressing Goal: Will remain free from infection Outcome: Progressing Goal: Diagnostic test results will improve Outcome: Progressing Goal: Respiratory complications will improve Outcome: Progressing Goal: Cardiovascular complication will be avoided Outcome: Progressing   Problem: Activity: Goal: Risk for activity intolerance will decrease Outcome: Progressing   Problem: Nutrition: Goal: Adequate nutrition will be maintained Outcome: Progressing   Problem: Coping: Goal: Level of anxiety will decrease Outcome: Progressing   Problem: Elimination: Goal: Will not experience complications related to bowel motility Outcome: Progressing Goal: Will not experience complications related to urinary retention 11/25/2022 0034 by Floyce Stakes, RN Outcome: Progressing 11/25/2022 0034 by Floyce Stakes, RN Outcome: Progressing   Problem: Pain Managment: Goal: General experience of comfort will improve 11/25/2022 0034 by Floyce Stakes, RN Outcome: Progressing 11/25/2022 0034 by Floyce Stakes, RN Outcome: Progressing   Problem: Skin Integrity: Goal: Risk for impaired skin integrity will decrease Outcome: Progressing   Problem: Safety: Goal: Ability to remain free from injury will improve Outcome: Progressing   Problem: Education: Goal: Verbalization of understanding the information provided (i.e., activity precautions, restrictions, etc) will improve Outcome: Progressing Goal: Individualized  Educational Video(s) Outcome: Progressing

## 2022-11-25 NOTE — Transfer of Care (Signed)
Immediate Anesthesia Transfer of Care Note  Patient: Abigail Shah  Procedure(s) Performed: TOTAL HIP ARTHROPLASTY ANTERIOR APPROACH (Right: Hip)  Patient Location: PACU  Anesthesia Type:Spinal  Level of Consciousness: awake and alert   Airway & Oxygen Therapy: Patient Spontanous Breathing and Patient connected to face mask oxygen  Post-op Assessment: Report given to RN and Post -op Vital signs reviewed and stable  Post vital signs: Reviewed and stable  Last Vitals:  Vitals Value Taken Time  BP 119/55 11/25/22 1218  Temp    Pulse 73 11/25/22 1218  Resp 8 11/25/22 1218  SpO2 100 % 11/25/22 1218  Vitals shown include unvalidated device data.  Last Pain:  Vitals:   11/25/22 0812  TempSrc:   PainSc: 2       Patients Stated Pain Goal: 3 (11/25/22 0730)  Complications: No notable events documented.

## 2022-11-26 DIAGNOSIS — S72001A Fracture of unspecified part of neck of right femur, initial encounter for closed fracture: Secondary | ICD-10-CM | POA: Diagnosis not present

## 2022-11-26 DIAGNOSIS — E871 Hypo-osmolality and hyponatremia: Secondary | ICD-10-CM | POA: Diagnosis not present

## 2022-11-26 DIAGNOSIS — D62 Acute posthemorrhagic anemia: Secondary | ICD-10-CM | POA: Diagnosis not present

## 2022-11-26 DIAGNOSIS — E7800 Pure hypercholesterolemia, unspecified: Secondary | ICD-10-CM | POA: Diagnosis not present

## 2022-11-26 LAB — BASIC METABOLIC PANEL
Anion gap: 8 (ref 5–15)
BUN: 13 mg/dL (ref 8–23)
CO2: 21 mmol/L — ABNORMAL LOW (ref 22–32)
Calcium: 7 mg/dL — ABNORMAL LOW (ref 8.9–10.3)
Chloride: 107 mmol/L (ref 98–111)
Creatinine, Ser: 0.56 mg/dL (ref 0.44–1.00)
GFR, Estimated: >60 mL/min (ref >60)
Glucose, Bld: 118 mg/dL — ABNORMAL HIGH (ref 70–99)
Potassium: 3.8 mmol/L (ref 3.5–5.1)
Sodium: 136 mmol/L (ref 135–145)

## 2022-11-26 LAB — CBC
HCT: 21.7 % — ABNORMAL LOW (ref 36.0–46.0)
Hemoglobin: 7 g/dL — ABNORMAL LOW (ref 12.0–15.0)
MCH: 21.2 pg — ABNORMAL LOW (ref 26.0–34.0)
MCHC: 32.3 g/dL (ref 30.0–36.0)
MCV: 65.8 fL — ABNORMAL LOW (ref 80.0–100.0)
Platelets: 164 10*3/uL (ref 150–400)
RBC: 3.3 MIL/uL — ABNORMAL LOW (ref 3.87–5.11)
RDW: 16.1 % — ABNORMAL HIGH (ref 11.5–15.5)
WBC: 8.3 10*3/uL (ref 4.0–10.5)
nRBC: 0 % (ref 0.0–0.2)

## 2022-11-26 LAB — PREPARE RBC (CROSSMATCH)

## 2022-11-26 LAB — BPAM RBC: ISSUE DATE / TIME: 202406091239

## 2022-11-26 LAB — TYPE AND SCREEN

## 2022-11-26 LAB — HEMOGLOBIN AND HEMATOCRIT, BLOOD
HCT: 36.6 % (ref 36.0–46.0)
Hemoglobin: 12.1 g/dL (ref 12.0–15.0)

## 2022-11-26 MED ORDER — SODIUM CHLORIDE 0.9% IV SOLUTION: Freq: Once | INTRAVENOUS | Status: AC

## 2022-11-26 MED ORDER — LIDOCAINE 5 % EX PTCH
1.0000 | MEDICATED_PATCH | CUTANEOUS | Status: DC
  Administered 2022-11-26 – 2022-11-27 (×2): 1 via TRANSDERMAL
  Filled 2022-11-26 (×2): qty 1

## 2022-11-26 MED ORDER — POLYETHYLENE GLYCOL 3350 17 G PO PACK
17.0000 g | PACK | Freq: Every day | ORAL | Status: DC
  Administered 2022-11-26 – 2022-11-27 (×2): 17 g via ORAL
  Filled 2022-11-26 (×2): qty 1

## 2022-11-26 MED ORDER — VITAMIN D (ERGOCALCIFEROL) 1.25 MG (50000 UNIT) PO CAPS
50000.0000 [IU] | ORAL_CAPSULE | ORAL | Status: DC
  Administered 2022-11-26: 50000 [IU] via ORAL
  Filled 2022-11-26: qty 1

## 2022-11-26 NOTE — Progress Notes (Signed)
OT Cancellation Note  Patient Details Name: Abigail Shah MRN: [id number] DOB: March 15, 1941   Cancelled Treatment:    Reason Eval/Treat Not Completed: Medical issues which prohibited therapy Patient currently receiving blood transfusion at this time. OT to continue to follow and check back as schedule will allow.  Rosalio Loud, MS Acute Rehabilitation Department Office# [phone]  11/26/2022, 3:59 PM

## 2022-11-26 NOTE — Plan of Care (Signed)
  Problem: Education: Goal: Knowledge of General Education information will improve Description: Including pain rating scale, medication(s)/side effects and non-pharmacologic comfort measures Outcome: Progressing   Problem: Clinical Measurements: Goal: Diagnostic test results will improve Outcome: Progressing   Problem: Elimination: Goal: Will not experience complications related to urinary retention Outcome: Progressing   

## 2022-11-26 NOTE — Progress Notes (Signed)
[facility]                                                                              Abigail Shah, is a 82 y.o. female, DOB - 08-Mar-1941, ZOX:[id number] Admit date - 11/24/2022    Outpatient Primary MD for the patient is Durene Cal, Aldine Contes, MD  LOS - 2  days  Chief Complaint  Patient presents with   Knee Pain       Brief summary   Patient is a 82 year old female with osteopenia, HLP presented with multiple falls, complaining of right knee pain.  Patient was seen in the ED on 6/3 for fall at home resulting in proximal right humeral fracture.  Patient saw orthopedics outpatient the next day.  At night before the admission, she was being assisted into her wheelchair and her right knee buckled, she fell backwards into the wheelchair.  Patient continued to complain of severe right knee pain pretty severe right knee pain. Imaging showed subcapital acute right femoral neck fracture.  Right knee imaging showed no fracture.  Orthopedics was consulted.  Assessment & Plan    Principal Problem:   Closed right hip fracture, initial encounter [facility]), recent multiple falls -Orthopedics was consulted, patient underwent right total hip arthroplasty on 6/8, postop day #1 -Pain is controlled, denies any numbness or tingling. -Pain control, DVT phylaxis per orthopedics -Placed on bowel regimen, MiraLAX daily, Colace 100 mg twice daily -PT recommended SNF for rehab  Active Problems: Acute blood loss anemia, expected postoperatively -Hemoglobin 7.0, baseline 10-11 -Transfuse 2 units packed RBCs    Hyperlipidemia -Continue statin  Osteopenia, vitamin D deficiency -Vitamin D level 26.7, placed on vitamin D replacement 50,000 units weekly  Acute hyponatremia -Likely hypovolemic hyponatremia in the setting of reduced oral intake and recent vomiting -Sodium 121 on admission, serum osmolarity 265 -Resolved  Hypokalemia -Resolved   Estimated body mass index is 27.1 kg/m as  calculated from the following:   Height as of this encounter: 5\' 3"  (1.6 m).   Weight as of this encounter: 69.4 kg.  Code Status: Full CODE STATUS DVT Prophylaxis:  SCDs Start: 11/25/22 1321   Level of Care: Level of care: Med-Surg Family Communication: Updated patient Disposition Plan:      Remains inpatient appropriate: PT recommended SNF   Procedures:  TOTAL HIP ARTHROPLASTY ANTERIOR APPROACH (Right)   Consultants:   Orthopedics  Antimicrobials:   Anti-infectives (From admission, onward)    Start     Dose/Rate Route Frequency Ordered Stop   11/25/22 1630  ceFAZolin (ANCEF) IVPB 2g/100 mL premix        2 g 200 mL/hr over 30 Minutes Intravenous Every 6 hours 11/25/22 1320 11/25/22 2240   11/25/22 1144  vancomycin (VANCOCIN) powder  Status:  Discontinued          As needed 11/25/22 1144 11/25/22 1317   11/25/22 0951  ceFAZolin (ANCEF) 2-4 GM/100ML-% IVPB       Note to Pharmacy: Mortimer Fries A: cabinet override      11/25/22 0951 11/25/22 1035   11/25/22 0915  ceFAZolin (ANCEF) IVPB 2g/100 mL premix  2 g 200 mL/hr over 30 Minutes Intravenous On call to O.R. 11/25/22 0503 11/25/22 1045   11/25/22 0806  ceFAZolin (ANCEF) 2-4 GM/100ML-% IVPB       Note to Pharmacy: Myrlene Broker M: cabinet override      11/25/22 0806 11/25/22 1337          Medications  acetaminophen  500 mg Oral Q6H   aspirin  81 mg Oral BID   atorvastatin  10 mg Oral QPM   docusate sodium  100 mg Oral BID   lidocaine  1 patch Transdermal Q24H   senna  1 tablet Oral BID   Vitamin D (Ergocalciferol)  50,000 Units Oral Q7 days      Subjective:   Abigail Shah was seen and examined today am.  Doing well, no acute complaints.  Pain is controlled.  No nausea or vomiting.  No dizziness, chest pain or shortness of breath.  Hemoglobin down to 7.0  Objective:   Vitals:   11/26/22 1026 11/26/22 1216 11/26/22 1245 11/26/22 1305  BP: (!) 124/57 (!) 140/63 (!) 140/63 (!) 158/65  Pulse: 86  78 78 94  Resp: 17 18 18 17   Temp: 98.1 F (36.7 C) 99.1 F (37.3 C) 99.1 F (37.3 C) 98.5 F (36.9 C)  TempSrc: Oral Oral Oral Oral  SpO2: 100% 100% 100% 92%  Weight:      Height:        Intake/Output Summary (Last 24 hours) at 11/26/2022 1354 Last data filed at 11/26/2022 1218 Gross per 24 hour  Intake 2213.33 ml  Output 1600 ml  Net 613.33 ml     Wt Readings from Last 3 Encounters:  11/24/22 69.4 kg  11/20/22 69.4 kg  04/25/22 73.1 kg   Physical Exam General: Alert and oriented x 3, NAD Cardiovascular: S1 S2 clear, RRR.  Respiratory: CTAB, no wheezing Gastrointestinal: Soft, nontender, nondistended, NBS Ext: no pedal edema bilaterally Neuro: no new deficits Psych: Normal affect   Data Reviewed:  I have personally reviewed following labs    CBC Lab Results  Component Value Date   WBC 8.3 11/26/2022   RBC 3.30 (L) 11/26/2022   HGB 7.0 (L) 11/26/2022   HCT 21.7 (L) 11/26/2022   MCV 65.8 (L) 11/26/2022   MCH 21.2 (L) 11/26/2022   PLT 164 11/26/2022   MCHC 32.3 11/26/2022   RDW 16.1 (H) 11/26/2022   LYMPHSABS 1.4 11/24/2022   MONOABS 1.1 (H) 11/24/2022   EOSABS 0.0 11/24/2022   BASOSABS 0.0 11/24/2022     Last metabolic panel Lab Results  Component Value Date   NA 136 11/26/2022   K 3.8 11/26/2022   CL 107 11/26/2022   CO2 21 (L) 11/26/2022   BUN 13 11/26/2022   CREATININE 0.56 11/26/2022   GLUCOSE 118 (H) 11/26/2022   GFRNONAA >60 11/26/2022   GFRAA 76 04/16/2020   CALCIUM 7.0 (L) 11/26/2022   PROT 6.2 (L) 11/24/2022   ALBUMIN 3.8 11/24/2022   BILITOT 0.9 11/24/2022   ALKPHOS 45 11/24/2022   AST 27 11/24/2022   ALT 25 11/24/2022   ANIONGAP 8 11/26/2022    CBG (last 3)  No results for input(s): "GLUCAP" in the last 72 hours.    Coagulation Profile: No results for input(s): "INR", "PROTIME" in the last 168 hours.   Radiology Studies: I have personally reviewed the imaging studies  DG Pelvis Portable  Result Date: 11/25/2022 CLINICAL  DATA:  Total hip replacement. EXAM: PORTABLE PELVIS 1-2 VIEWS COMPARISON:  11/24/2022 FINDINGS: Status  post right total hip replacement. No evidence for immediate hardware complication. Gas in the overlying soft tissues is compatible with the immediate postoperative state. IMPRESSION: Status post right total hip replacement without evidence for immediate hardware complication. Electronically Signed   By: Kennith Center M.D.   On: 11/25/2022 13:12   DG HIP UNILAT WITH PELVIS 1V RIGHT  Result Date: 11/25/2022 CLINICAL DATA:  Right hip fracture undergoing arthroplasty EXAM: DG HIP (WITH OR WITHOUT PELVIS) 1V RIGHT; DG C-ARM 1-60 MIN-NO REPORT COMPARISON:  Preoperative radiographs dated 11/24/2022 FINDINGS: Frontal intraoperative radiograph demonstrates in progress right hip arthroplasty. No evidence of hardware complication. IMPRESSION: In progress right hip arthroplasty without evidence of complication. Electronically Signed   By: Malachy Moan M.D.   On: 11/25/2022 13:06   DG C-Arm 1-60 Min-No Report  Result Date: 11/25/2022 Fluoroscopy was utilized by the requesting physician.  No radiographic interpretation.       Thad Ranger M.D. [facility] 11/26/2022, 1:54 PM  Available via Epic secure chat 7am-7pm After 7 pm, please refer to night coverage provider listed on amion.

## 2022-11-26 NOTE — Evaluation (Addendum)
Physical Therapy Evaluation Patient Details Name: Abigail Shah MRN: [id number] DOB: 1940-10-26 Today's Date: 11/26/2022  History of Present Illness  82 year old female admitted with multiple falls, c/o of right knee pain. Patient was seen in the ED on 6/3 for fall at home resulting in proximal right humeral fracture. prior to this admission pt ws being  assisted into her wheelchair,  right knee buckled,  pt fell backwards into the wheelchair.  pt c/o R knee pain. Imaging showed subcapital acute right femoral neck fracture. Right knee imaging negative for  fracture. Orthopedics consulted. pt is s/p R THA, AA on 11/25/22.  PMH: osteopenia, HLP  Clinical Impression  Pt admitted with above diagnosis.  Pt reports independence at her baseline, however has had multiple recent falls, was going to PT for her L shoulder prior to admission/falls. Went to [facility] (SNF) after fall resulting in R humerus fx. Pt was able to stand and take some lateral steps along EOB with min assist of 2, deferred gait/ LUE WBing on device d/t pt receiving PRBCs and IV located in precarious position in L hand  Currently deconditioned, weaker than her baseline and will benefit from PT in acute setting as well as continue rehab at [facility] post acute   Pt currently with functional limitations due to the deficits listed below (see PT Problem List). Pt will benefit from acute skilled PT to increase their independence and safety with mobility to allow discharge.          Recommendations for follow up therapy are one component of a multi-disciplinary discharge planning process, led by the attending physician.  Recommendations may be updated based on patient status, additional functional criteria and insurance authorization.  Follow Up Recommendations       Assistance Recommended at Discharge Frequent or constant Supervision/Assistance  Patient can return home with the following       Equipment  Recommendations None recommended by PT  Recommendations for Other Services       Functional Status Assessment Patient has had a recent decline in their functional status and demonstrates the ability to make significant improvements in function in a reasonable and predictable amount of time.     Precautions / Restrictions Precautions Precautions: Fall Precaution Comments: pt endorses multiple recent falls Required Braces or Orthoses: Sling Restrictions Weight Bearing Restrictions: Yes RUE Weight Bearing: Non weight bearing RLE Weight Bearing: Weight bearing as tolerated Other Position/Activity Restrictions: presumed NWB R UE, RUE  sling      Mobility  Bed Mobility Overal bed mobility: Needs Assistance Bed Mobility: Supine to Sit, Sit to Supine     Supine to sit: Mod assist, +2 for safety/equipment Sit to supine: +2 for safety/equipment, Min assist, +2 for physical assistance   General bed mobility comments: verbal cues for sequence and self assist. assist to elevate trunk and pivot hips to sitting-bed pad utilized;  assist to control trunk descent and fully elevate LEs on to bed    Transfers Overall transfer level: Needs assistance Equipment used: 1 person hand held assist Transfers: Sit to/from Stand Sit to Stand: Min assist, +2 physical assistance, +2 safety/equipment           General transfer comment: assist to rise and stabilize, compensatory wide BOS; assist to steady in standing.    Ambulation/Gait Ambulation/Gait assistance: Min assist, +2 physical assistance, +2 safety/equipment   Assistive device: 1 person hand held assist         General Gait Details: lateral steps along EOB  with assist to balance while  shifting  Stairs            Wheelchair Mobility    Modified Rankin (Stroke Patients Only)       Balance Overall balance assessment: Needs assistance, History of Falls Sitting-balance support: Single extremity supported, No upper  extremity supported, Feet supported Sitting balance-Leahy Scale: Fair     Standing balance support: Single extremity supported Standing balance-Leahy Scale: Poor                               Pertinent Vitals/Pain Pain Assessment Pain Assessment: Faces Faces Pain Scale: Hurts little more Pain Location: R knee Pain Descriptors / Indicators: Sore Pain Intervention(s): Limited activity within patient's tolerance, Monitored during session, Repositioned, Ice applied    Home Living Family/patient expects to be discharged to:: Skilled nursing facility   Available Help at Discharge: Family Type of Home: Independent living facility             Additional Comments: pt resides at [facility]; was in ILF prior to  fall on 6/3 resluting in humerus fx;    Prior Function Prior Level of Function : Independent/Modified Independent             Mobility Comments: indpendent prior to falls       Hand Dominance        Extremity/Trunk Assessment   Upper Extremity Assessment Upper Extremity Assessment: Defer to OT evaluation    Lower Extremity Assessment Lower Extremity Assessment: RLE deficits/detail;LLE deficits/detail RLE Deficits / Details: grossly 3+/5 R knee flexion/extension. ankle grossly WFL. bruising over anterior patella. LLE Deficits / Details: grossly WFL       Communication   Communication: No difficulties  Cognition Arousal/Alertness: Awake/alert Behavior During Therapy: WFL for tasks assessed/performed Overall Cognitive Status: Within Functional Limits for tasks assessed                                 General Comments: requires redirection to task at times        General Comments      Exercises General Exercises - Lower Extremity Ankle Circles/Pumps: AROM, Both, 5 reps Quad Sets: Strengthening, Both (3x)   Assessment/Plan    PT Assessment Patient needs continued PT services  PT Problem List Decreased strength;Decreased  activity tolerance;Decreased mobility;Decreased balance;Pain;Decreased knowledge of precautions;Decreased knowledge of use of DME       PT Treatment Interventions DME instruction;Gait training;Functional mobility training;Therapeutic activities;Patient/family education;Therapeutic exercise;Balance training    PT Goals (Current goals can be found in the Care Plan section)  Acute Rehab PT Goals Patient Stated Goal: stop falling PT Goal Formulation: With patient Time For Goal Achievement: 12/10/22 Potential to Achieve Goals: Good    Frequency Min 1X/week     Co-evaluation               AM-PAC PT "6 Clicks" Mobility  Outcome Measure Help needed turning from your back to your side while in a flat bed without using bedrails?: A Lot Help needed moving from lying on your back to sitting on the side of a flat bed without using bedrails?: A Lot Help needed moving to and from a bed to a chair (including a wheelchair)?: A Lot Help needed standing up from a chair using your arms (e.g., wheelchair or bedside chair)?: A Lot Help needed to walk in hospital room?: A Lot  Help needed climbing 3-5 steps with a railing? : Total 6 Click Score: 11    End of Session Equipment Utilized During Treatment: Gait belt Activity Tolerance: Patient tolerated treatment well Patient left: in bed;with call bell/phone within reach;with bed alarm set;with family/visitor present   PT Visit Diagnosis: Other abnormalities of gait and mobility (R26.89);Difficulty in walking, not elsewhere classified (R26.2)    Time: 1191-4782 PT Time Calculation (min) (ACUTE ONLY): 23 min   Charges:   PT Evaluation $PT Eval Low Complexity: 1 Low PT Treatments $Therapeutic Activity: 8-22 mins        Delice Bison, PT  Acute Rehab Dept [facility]) [phone]  11/26/2022   [facility] 11/26/2022, 12:31 PM

## 2022-11-26 NOTE — Progress Notes (Signed)
Subjective: 1 Day Post-Op Procedure(s) (LRB): TOTAL HIP ARTHROPLASTY ANTERIOR APPROACH (Right)  Patient reports pain as appropriately controlled. Denies any new numbness/tingling.   Objective:   VITALS:  Temp:  [97.9 F (36.6 C)-99.1 F (37.3 C)] 99.1 F (37.3 C) (06/09 0526) Pulse Rate:  [73-88] 84 (06/09 0526) Resp:  [8-18] 18 (06/09 0526) BP: (112-152)/(55-67) 146/61 (06/09 0526) SpO2:  [95 %-100 %] 99 % (06/09 0526)  Gen: AAOx3, NAD Comfortable at rest  Right Lower Extremity: Skin intact, dressings c/d/i HF/KE/KF/ADF/APF/EHL 5/5 SILT throughout DP, PT 2+ to palp CR < 2s    LABS Recent Labs    11/24/22 0815 11/26/22 0728  HGB 10.2* 7.0*  WBC 9.5 8.3  PLT 183 164   Recent Labs    11/25/22 0738 11/26/22 0728  NA 132* 136  K 3.4* 3.8  CL 96* 107  CO2 27 21*  BUN 11 13  CREATININE 0.69 0.56  GLUCOSE 97 118*   No results for input(s): "LABPT", "INR" in the last 72 hours.   Assessment/Plan: 1 Day Post-Op Procedure(s) (LRB): TOTAL HIP ARTHROPLASTY ANTERIOR APPROACH (Right)  Advance diet Up with therapy Plan for discharge per primary team  Abigail Shah 11/26/2022, 9:14 AM

## 2022-11-26 NOTE — Plan of Care (Signed)
  Problem: Education: Goal: Knowledge of General Education information will improve Description: Including pain rating scale, medication(s)/side effects and non-pharmacologic comfort measures Outcome: Progressing   Problem: Health Behavior/Discharge Planning: Goal: Ability to manage health-related needs will improve Outcome: Progressing   Problem: Clinical Measurements: Goal: Ability to maintain clinical measurements within normal limits will improve Outcome: Progressing Goal: Will remain free from infection Outcome: Progressing Goal: Diagnostic test results will improve Outcome: Progressing Goal: Respiratory complications will improve Outcome: Progressing Goal: Cardiovascular complication will be avoided Outcome: Progressing   Problem: Activity: Goal: Risk for activity intolerance will decrease Outcome: Progressing   Problem: Nutrition: Goal: Adequate nutrition will be maintained Outcome: Completed/Met   Problem: Coping: Goal: Level of anxiety will decrease Outcome: Progressing   Problem: Elimination: Goal: Will not experience complications related to bowel motility Outcome: Progressing Goal: Will not experience complications related to urinary retention Outcome: Progressing   Problem: Pain Managment: Goal: General experience of comfort will improve Outcome: Progressing   Problem: Safety: Goal: Ability to remain free from injury will improve Outcome: Progressing   Problem: Skin Integrity: Goal: Risk for impaired skin integrity will decrease Outcome: Progressing   Problem: Pain Management: Goal: Pain level will decrease Outcome: Adequate for Discharge   Problem: Education: Goal: Knowledge of the prescribed therapeutic regimen will improve Outcome: Progressing Goal: Understanding of discharge needs will improve Outcome: Progressing Goal: Individualized Educational Video(s) Outcome: Progressing   Problem: Activity: Goal: Ability to avoid complications of  mobility impairment will improve Outcome: Progressing Goal: Ability to tolerate increased activity will improve Outcome: Progressing   Problem: Clinical Measurements: Goal: Postoperative complications will be avoided or minimized Outcome: Progressing   Problem: Pain Management: Goal: Pain level will decrease with appropriate interventions Outcome: Progressing   Problem: Skin Integrity: Goal: Will show signs of wound healing Outcome: Progressing   Problem: Education: Goal: Knowledge of the prescribed therapeutic regimen will improve Outcome: Progressing Goal: Understanding of discharge needs will improve Outcome: Progressing Goal: Individualized Educational Video(s) Outcome: Progressing   Problem: Pain Management: Goal: Pain level will decrease with appropriate interventions Outcome: Progressing   Problem: Skin Integrity: Goal: Will show signs of wound healing Outcome: Progressing   Problem: Education: Goal: Knowledge of the prescribed therapeutic regimen will improve Outcome: Progressing Goal: Understanding of discharge needs will improve Outcome: Progressing Goal: Individualized Educational Video(s) Outcome: Progressing

## 2022-11-27 ENCOUNTER — Encounter (HOSPITAL_COMMUNITY): Payer: Self-pay | Admitting: Orthopedic Surgery

## 2022-11-27 DIAGNOSIS — M6281 Muscle weakness (generalized): Secondary | ICD-10-CM | POA: Diagnosis not present

## 2022-11-27 DIAGNOSIS — Z7401 Bed confinement status: Secondary | ICD-10-CM | POA: Diagnosis not present

## 2022-11-27 DIAGNOSIS — E7800 Pure hypercholesterolemia, unspecified: Secondary | ICD-10-CM | POA: Diagnosis not present

## 2022-11-27 DIAGNOSIS — R21 Rash and other nonspecific skin eruption: Secondary | ICD-10-CM | POA: Diagnosis not present

## 2022-11-27 DIAGNOSIS — R600 Localized edema: Secondary | ICD-10-CM | POA: Diagnosis not present

## 2022-11-27 DIAGNOSIS — R278 Other lack of coordination: Secondary | ICD-10-CM | POA: Diagnosis not present

## 2022-11-27 DIAGNOSIS — S42201D Unspecified fracture of upper end of right humerus, subsequent encounter for fracture with routine healing: Secondary | ICD-10-CM | POA: Diagnosis not present

## 2022-11-27 DIAGNOSIS — I10 Essential (primary) hypertension: Secondary | ICD-10-CM | POA: Diagnosis not present

## 2022-11-27 DIAGNOSIS — D62 Acute posthemorrhagic anemia: Secondary | ICD-10-CM | POA: Diagnosis not present

## 2022-11-27 DIAGNOSIS — Z743 Need for continuous supervision: Secondary | ICD-10-CM | POA: Diagnosis not present

## 2022-11-27 DIAGNOSIS — R52 Pain, unspecified: Secondary | ICD-10-CM | POA: Diagnosis not present

## 2022-11-27 DIAGNOSIS — D649 Anemia, unspecified: Secondary | ICD-10-CM | POA: Diagnosis not present

## 2022-11-27 DIAGNOSIS — R6889 Other general symptoms and signs: Secondary | ICD-10-CM | POA: Diagnosis not present

## 2022-11-27 DIAGNOSIS — E871 Hypo-osmolality and hyponatremia: Secondary | ICD-10-CM | POA: Diagnosis not present

## 2022-11-27 DIAGNOSIS — R2689 Other abnormalities of gait and mobility: Secondary | ICD-10-CM | POA: Diagnosis not present

## 2022-11-27 DIAGNOSIS — S72001A Fracture of unspecified part of neck of right femur, initial encounter for closed fracture: Secondary | ICD-10-CM | POA: Diagnosis not present

## 2022-11-27 DIAGNOSIS — S72031D Displaced midcervical fracture of right femur, subsequent encounter for closed fracture with routine healing: Secondary | ICD-10-CM | POA: Diagnosis not present

## 2022-11-27 DIAGNOSIS — E785 Hyperlipidemia, unspecified: Secondary | ICD-10-CM | POA: Diagnosis not present

## 2022-11-27 DIAGNOSIS — Z7689 Persons encountering health services in other specified circumstances: Secondary | ICD-10-CM | POA: Diagnosis not present

## 2022-11-27 DIAGNOSIS — N390 Urinary tract infection, site not specified: Secondary | ICD-10-CM | POA: Diagnosis not present

## 2022-11-27 LAB — BPAM RBC
ISSUE DATE / TIME: 202406090949
Unit Type and Rh: 9500

## 2022-11-27 LAB — BASIC METABOLIC PANEL
Anion gap: 9 (ref 5–15)
BUN: 15 mg/dL (ref 8–23)
CO2: 25 mmol/L (ref 22–32)
Calcium: 8.4 mg/dL — ABNORMAL LOW (ref 8.9–10.3)
Chloride: 100 mmol/L (ref 98–111)
Creatinine, Ser: 0.65 mg/dL (ref 0.44–1.00)
GFR, Estimated: >60 mL/min (ref >60)
Glucose, Bld: 112 mg/dL — ABNORMAL HIGH (ref 70–99)
Potassium: 4.7 mmol/L (ref 3.5–5.1)
Sodium: 134 mmol/L — ABNORMAL LOW (ref 135–145)

## 2022-11-27 LAB — TYPE AND SCREEN: Unit division: 0

## 2022-11-27 LAB — CBC
HCT: 36.8 % (ref 36.0–46.0)
Hemoglobin: 12.2 g/dL (ref 12.0–15.0)
MCH: 23.6 pg — ABNORMAL LOW (ref 26.0–34.0)
MCHC: 33.2 g/dL (ref 30.0–36.0)
MCV: 71.2 fL — ABNORMAL LOW (ref 80.0–100.0)
Platelets: 190 10*3/uL (ref 150–400)
RBC: 5.17 MIL/uL — ABNORMAL HIGH (ref 3.87–5.11)
RDW: 20.6 % — ABNORMAL HIGH (ref 11.5–15.5)
WBC: 9.2 10*3/uL (ref 4.0–10.5)
nRBC: 0 % (ref 0.0–0.2)

## 2022-11-27 MED ORDER — ASPIRIN 81 MG PO CHEW: 81.0000 mg | CHEWABLE_TABLET | Freq: Two times a day (BID) | ORAL | 0 refills | Status: AC

## 2022-11-27 MED ORDER — DOCUSATE SODIUM 100 MG PO CAPS: 100.0000 mg | ORAL_CAPSULE | Freq: Two times a day (BID) | ORAL | 0 refills | Status: AC

## 2022-11-27 MED ORDER — HYDROCODONE-ACETAMINOPHEN 5-325 MG PO TABS: 1.0000 | ORAL_TABLET | ORAL | 0 refills | Status: AC | PRN

## 2022-11-27 MED ORDER — VITAMIN D (ERGOCALCIFEROL) 1.25 MG (50000 UNIT) PO CAPS: 50000.0000 [IU] | ORAL_CAPSULE | ORAL | Status: DC

## 2022-11-27 NOTE — TOC Transition Note (Signed)
Transition of Care [facility]) - CM/SW Discharge Note  Patient Details  Name: Abigail Shah MRN: [id number] Date of Birth: 1940-08-29  [facility]) CM/SW Contact:  Ewing Schlein, LCSW Phone Number: 11/27/2022, 1:33 PM  Clinical Narrative: Patient is medically stable to discharge to [facility] today. CSW confirmed bed with [place] in admissions. FL2 done; PASRR confirmed. CSW completed insurance authorization on [employer] portal. Reference # is: [id number]. Patient has been approved for 11/27/2022-11/29/2022. Discharge summary, discharge orders, FL2, and SNF transfer report faxed to facility in hub. Patient will go to room 403P and the number for report is [phone]. Medical necessity form done; PTAR scheduled. Discharge packet completed. CSW updated patient and son regarding insurance approval and transportation. RN updated. TOC signing off.  Final next level of care: Skilled Nursing Facility Barriers to Discharge: Barriers Resolved  Patient Goals and CMS Choice CMS Medicare.gov Compare Post Acute Care list provided to:: Patient Choice offered to / list presented to : Patient  Discharge Placement Existing PASRR number confirmed : 11/27/22          Patient chooses bed at: [facility] Patient to be transferred to facility by: PTAR Name of family member notified: Delonna Ney (son) Patient and family notified of of transfer: 11/27/22  Discharge Plan and Services Additional resources added to the After Visit Summary for           DME Arranged: N/A DME Agency: NA  Social Determinants of Health (SDOH) Interventions SDOH Screenings   Food Insecurity: No Food Insecurity (11/24/2022)  Housing: Low Risk  (11/24/2022)  Transportation Needs: No Transportation Needs (11/24/2022)  Utilities: Not At Risk (11/24/2022)  Depression (PHQ2-9): Low Risk  (04/25/2022)  Tobacco Use: Low Risk  (11/25/2022)   Readmission Risk Interventions     No data to display

## 2022-11-27 NOTE — Progress Notes (Signed)
Call [facility] at [phone] to give report. Left msg to call back.

## 2022-11-27 NOTE — Progress Notes (Signed)
    Subjective:  Patient reports pain as mild to moderate.  Denies N/V/CP/SOB/Abd pain. She states she has some soreness in her thigh.   Objective:   VITALS:   Vitals:   11/26/22 2253 11/26/22 2257 11/27/22 0000 11/27/22 0535  BP: (!) 165/81   (!) 165/71  Pulse: 87   84  Resp: 17   17  Temp: 99.5 F (37.5 C)   (!) 97.4 F (36.3 C)  TempSrc: Oral   Oral  SpO2: (!) 89% 93% 94% 97%  Weight:      Height:        Patient sitting up in bed eating breakfast. NAD.  Neurologically intact ABD soft Neurovascular intact Sensation intact distally Intact pulses distally Dorsiflexion/Plantar flexion intact Incision: dressing C/D/I No cellulitis present Compartment soft RUE in sling.   Lab Results  Component Value Date   WBC 9.2 11/27/2022   HGB 12.2 11/27/2022   HCT 36.8 11/27/2022   MCV 71.2 (L) 11/27/2022   PLT 190 11/27/2022   BMET    Component Value Date/Time   NA 134 (L) 11/27/2022 0351   K 4.7 11/27/2022 0351   CL 100 11/27/2022 0351   CO2 25 11/27/2022 0351   GLUCOSE 112 (H) 11/27/2022 0351   GLUCOSE 92 04/12/2006 1103   BUN 15 11/27/2022 0351   CREATININE 0.65 11/27/2022 0351   CREATININE 0.84 04/16/2020 1016   CALCIUM 8.4 (L) 11/27/2022 0351   GFRNONAA >60 11/27/2022 0351   GFRNONAA 65 04/16/2020 1016     Assessment/Plan: 2 Days Post-Op   Principal Problem:   Closed right hip fracture, initial encounter (HCC) Active Problems:   Hyperlipidemia   Osteoporosis   Hyponatremia   Vomiting   Acute postoperative anemia due to expected blood loss   WBAT with walker DVT ppx: Aspirin, SCDs, TEDS PO pain control PT/OT:Continue PT/OT. Dispo: Patient undercare of the medical team, disposition per their recommendation. Pain medication and DVT ppx printed in chart.    Clois Dupes, PA-C 11/27/2022, 8:20 AM   [facility]  [place] [address], [place], [place] [address] Phone: [phone] [email or web address] Facebook   [employer]

## 2022-11-27 NOTE — Plan of Care (Signed)
?  Problem: Activity: ?Goal: Risk for activity intolerance will decrease ?Outcome: Progressing ?  ?Problem: Safety: ?Goal: Ability to remain free from injury will improve ?Outcome: Progressing ?  ?Problem: Pain Managment: ?Goal: General experience of comfort will improve ?Outcome: Progressing ?  ?

## 2022-11-27 NOTE — Progress Notes (Signed)
OT Cancellation Note  Patient Details Name: MALISSIA RABBANI MRN: [id number] DOB: 11-24-1940   Cancelled Treatment:    Reason Eval/Treat Not Completed: Other (comment) Patient approached this AM with patient eating breakfast, this afternoon patient is requesting to rest with pending transfer to SNF this afternoon. OT to continue to follow and check back as schedule will allow.  Rosalio Loud, MS Acute Rehabilitation Department Office# [phone]  11/27/2022, 1:13 PM

## 2022-11-27 NOTE — Discharge Summary (Signed)
Physician Discharge Summary   Patient: Abigail Shah MRN: [id number] DOB: Mar 09, 1941  Admit date:     11/24/2022  Discharge date: 11/27/22  Discharge Physician: Thad Ranger, MD    PCP: Shelva Majestic, MD   Recommendations at discharge:   Weightbearing as tolerated with walker Continue aspirin for DVT prophylaxis, PT OT Outpatient follow-up with orthopedics in 2 weeks  Discharge Diagnoses:    Closed right hip fracture, (HCC)   Acute postoperative anemia due to expected blood loss   Hyperlipidemia   Osteoporosis Acute hyponatremia   Vomiting Hypokalemia    Hospital Course: Patient is a 82 year old female with osteopenia, HLP presented with multiple falls, complaining of right knee pain.  Patient was seen in the ED on 6/3 for fall at home resulting in proximal right humeral fracture.  Patient saw orthopedics outpatient the next day.  At night before the admission, she was being assisted into her wheelchair and her right knee buckled, she fell backwards into the wheelchair.  Patient continued to complain of severe right knee pain pretty severe right knee pain. Imaging showed subcapital acute right femoral neck fracture.  Right knee imaging showed no fracture.  Orthopedics was consulted.   Assessment and Plan:   Closed right hip fracture, initial encounter [facility]), recent multiple falls -Orthopedics was consulted, patient underwent right total hip arthroplasty on 6/8, postop day # 2 -Pain is controlled, denies any numbness or tingling. -PT recommended SNF for rehab -Pain is controlled, continue pain medications, bowel regimen -Per orthopedics, weightbearing as tolerated with walker, aspirin for DVT prophylaxis    Acute blood loss anemia, expected postoperatively -Hemoglobin 7.0 on 6/9, likely postoperative anemia, baseline 10-11 -Transfused 2 units packed RBCs -H&H stable 12.2 at discharge     Hyperlipidemia -Continue statin   Osteopenia, vitamin D deficiency -Vitamin  D level 26.7, placed on vitamin D replacement 50,000 units weekly   Acute hyponatremia -Likely hypovolemic hyponatremia in the setting of reduced oral intake and recent vomiting -Sodium 121 on admission, serum osmolarity 265 -Resolved, sodium 134 at discharge   Hypokalemia -Resolved     Estimated body mass index is 27.1 kg/m as calculated from the following:   Height as of this encounter: 5\' 3"  (1.6 m).   Weight as of this encounter: 69.4 kg.        Pain control - [employer] Controlled Substance Reporting System database was reviewed. and patient was instructed, not to drive, operate heavy machinery, perform activities at heights, swimming or participation in water activities or provide baby-sitting services while on Pain, Sleep and Anxiety Medications; until their outpatient Physician has advised to do so again. Also recommended to not to take more than prescribed Pain, Sleep and Anxiety Medications.  Consultants: Orthopedics Procedures performed: TOTAL HIP ARTHROPLASTY ANTERIOR APPROACH (Right)   Disposition: Skilled nursing facility Diet recommendation:  Discharge Diet Orders (From admission, onward)     Start     Ordered   11/27/22 0000  Diet general        11/27/22 1150            DISCHARGE MEDICATION: Allergies as of 11/27/2022   No Known Allergies      Medication List     STOP taking these medications    ibuprofen 600 MG tablet Commonly known as: ADVIL   naproxen 375 MG tablet Commonly known as: NAPROSYN   oxyCODONE 5 MG immediate release tablet Commonly known as: Roxicodone   promethazine 12.5 MG suppository Commonly known as: PHENERGAN  TAKE these medications    acetaminophen 650 MG CR tablet Commonly known as: TYLENOL Take 650 mg by mouth every 6 (six) hours as needed for pain.   aspirin 81 MG chewable tablet Commonly known as: Aspirin Childrens Chew 1 tablet (81 mg total) by mouth 2 (two) times daily with a meal.    atorvastatin 10 MG tablet Commonly known as: LIPITOR Take 1 tablet (10 mg total) by mouth daily. What changed: when to take this   B-12 1000 MCG Subl Place under the tongue.   bisacodyl 10 MG suppository Commonly known as: DULCOLAX Place 10 mg rectally as needed for moderate constipation.   Calcium Carbonate-Vitamin D 500-3.125 MG-MCG Tabs Take by mouth.   docusate sodium 100 MG capsule Commonly known as: COLACE Take 1 capsule (100 mg total) by mouth 2 (two) times daily.   HYDROcodone-acetaminophen 5-325 MG tablet Commonly known as: NORCO/VICODIN Take 1 tablet by mouth every 4 (four) hours as needed for up to 7 days for moderate pain or severe pain.   ondansetron 4 MG tablet Commonly known as: ZOFRAN Take 4 mg by mouth every 6 (six) hours as needed for nausea or vomiting.   polyethylene glycol 17 g packet Commonly known as: MIRALAX / GLYCOLAX Take 17 g by mouth daily.   Vitamin D (Ergocalciferol) 1.25 MG (50000 UNIT) Caps capsule Commonly known as: DRISDOL Take 1 capsule (50,000 Units total) by mouth every 7 (seven) days. Start taking on: December 03, 2022        Follow-up Information     Shelva Majestic, MD. Schedule an appointment as soon as possible for a visit in 2 week(s).   Specialty: Family Medicine Why: for hospital follow-up Contact information: [address] [place] [address] [phone]         Samson Frederic, MD. Schedule an appointment as soon as possible for a visit in 2 week(s).   Specialty: Orthopedic Surgery Why: for hospital follow-up Contact information: [address] [place] [address] [phone]                Discharge Exam: Ceasar Mons Weights   11/24/22 0623  Weight: 69.4 kg   S: No acute complaints, pain is controlled.  Eating breakfast without any difficulty.  Working with PT.  Cleared by orthopedics to discharge today.  BP (!) 165/71 (BP Location: Left Arm)   Pulse 84   Temp (!) 97.4  F (36.3 C) (Oral)   Resp 17   Ht 5\' 3"  (1.6 m)   Wt 69.4 kg   LMP  (LMP Unknown)   SpO2 97%   BMI 27.10 kg/m    Physical Exam General: Alert and oriented x 3, NAD Cardiovascular: S1 S2 clear, RRR.  Respiratory: CTAB, no wheezing Gastrointestinal: Soft, nontender, nondistended, NBS Ext: no pedal edema bilaterally Neuro: no new deficits Psych: Normal affect      Condition at discharge: fair  The results of significant diagnostics from this hospitalization (including imaging, microbiology, ancillary and laboratory) are listed below for reference.   Imaging Studies: DG Pelvis Portable  Result Date: 11/25/2022 CLINICAL DATA:  Total hip replacement. EXAM: PORTABLE PELVIS 1-2 VIEWS COMPARISON:  11/24/2022 FINDINGS: Status post right total hip replacement. No evidence for immediate hardware complication. Gas in the overlying soft tissues is compatible with the immediate postoperative state. IMPRESSION: Status post right total hip replacement without evidence for immediate hardware complication. Electronically Signed   By: Kennith Center M.D.   On: 11/25/2022 13:12  DG HIP UNILAT WITH PELVIS 1V RIGHT  Result Date: 11/25/2022 CLINICAL DATA:  Right hip fracture undergoing arthroplasty EXAM: DG HIP (WITH OR WITHOUT PELVIS) 1V RIGHT; DG C-ARM 1-60 MIN-NO REPORT COMPARISON:  Preoperative radiographs dated 11/24/2022 FINDINGS: Frontal intraoperative radiograph demonstrates in progress right hip arthroplasty. No evidence of hardware complication. IMPRESSION: In progress right hip arthroplasty without evidence of complication. Electronically Signed   By: Malachy Moan M.D.   On: 11/25/2022 13:06   DG C-Arm 1-60 Min-No Report  Result Date: 11/25/2022 Fluoroscopy was utilized by the requesting physician.  No radiographic interpretation.   CT KNEE RIGHT WO CONTRAST  Result Date: 11/24/2022 CLINICAL DATA:  Fall, right femoral neck fracture, clinical concern for knee fracture occult on  radiography. EXAM: CT OF THE RIGHT KNEE WITHOUT CONTRAST TECHNIQUE: Multidetector CT imaging of the right knee was performed according to the standard protocol. Multiplanar CT image reconstructions were also generated. RADIATION DOSE REDUCTION: This exam was performed according to the departmental dose-optimization program which includes automated exposure control, adjustment of the mA and/or kV according to patient size and/or use of iterative reconstruction technique. COMPARISON:  Radiograph 11/24/2022 FINDINGS: Bones/Joint/Cartilage Tricompartmental osteoarthritis with subcortical cyst formation and marginal spurring. Well corticated chronically fragmented lateral patellar spurs. No acute fracture identified. No knee joint effusion to further suggest occult fracture. Ligaments Suboptimally assessed by CT. Muscles and Tendons Unremarkable Soft tissues Mild prepatellar subcutaneous edema. IMPRESSION: 1. No acute fracture identified. 2. Tricompartmental osteoarthritis. 3. Mild prepatellar subcutaneous edema. Electronically Signed   By: Gaylyn Rong M.D.   On: 11/24/2022 11:14   DG Pelvis Portable  Result Date: 11/24/2022 CLINICAL DATA:  Status post fall. EXAM: PORTABLE PELVIS 1-2 VIEWS COMPARISON:  None Available. FINDINGS: There is an acute right subcapital femoral neck fracture. Superior displacement of the distal fracture fragments noted. No sign of dislocation. Left hip appears intact and located. No signs of pelvic fracture or diastasis. IMPRESSION: Acute right subcapital femoral neck fracture. Electronically Signed   By: Signa Kell M.D.   On: 11/24/2022 07:22   DG Knee Right Port  Result Date: 11/24/2022 CLINICAL DATA:  Right knee pain.  Fall. EXAM: PORTABLE RIGHT KNEE - 1-2 VIEW COMPARISON:  None Available. FINDINGS: No significant joint effusion. No signs of acute fracture or dislocation. Moderate tricompartment joint space narrowing with marginal spur formation and sharpening of the tibial  spines. Soft tissues appear unremarkable. IMPRESSION: 1. No acute findings. 2. Moderate tricompartment osteoarthritis. Electronically Signed   By: Signa Kell M.D.   On: 11/24/2022 07:01   DG Chest Portable 1 View  Result Date: 11/24/2022 CLINICAL DATA:  Fall.  Knee pain.  Weakness. EXAM: PORTABLE CHEST 1 VIEW COMPARISON:  None Available. FINDINGS: Normal heart size. Streaky opacities noted within the left base compatible with atelectasis versus scarring. No pleural effusion, and interstitial edema or pneumothorax. There is an acute fracture involving the humeral neck and greater tuberosity as noted on films from 11/20/2022. IMPRESSION: 1. Left base atelectasis versus scarring. 2. Acute fracture involving the left humeral neck and greater tuberosity. Electronically Signed   By: Signa Kell M.D.   On: 11/24/2022 06:59   DG Humerus Right  Result Date: 11/20/2022 CLINICAL DATA:  Fall onto shoulder, suspect humeral fracture. EXAM: RIGHT HUMERUS - 2+ VIEW; RIGHT SHOULDER - 1 VIEW COMPARISON:  None Available. FINDINGS: There is a comminuted displaced fracture of the humeral neck and greater tuberosity. No dislocation is seen. Hypertrophic degenerative changes are present at the acromioclavicular joint. The soft tissues  are unremarkable. IMPRESSION: Mildly displaced comminuted fracture involving the humeral neck and greater tuberosity. No dislocation. Electronically Signed   By: Thornell Sartorius M.D.   On: 11/20/2022 20:52   DG Shoulder Right Portable  Result Date: 11/20/2022 CLINICAL DATA:  Fall onto shoulder, suspect humeral fracture. EXAM: RIGHT HUMERUS - 2+ VIEW; RIGHT SHOULDER - 1 VIEW COMPARISON:  None Available. FINDINGS: There is a comminuted displaced fracture of the humeral neck and greater tuberosity. No dislocation is seen. Hypertrophic degenerative changes are present at the acromioclavicular joint. The soft tissues are unremarkable. IMPRESSION: Mildly displaced comminuted fracture involving the  humeral neck and greater tuberosity. No dislocation. Electronically Signed   By: Thornell Sartorius M.D.   On: 11/20/2022 20:52    Microbiology: Results for orders placed or performed during the hospital encounter of 11/24/22  Surgical pcr screen     Status: None   Collection Time: 11/24/22  1:19 PM   Specimen: Nasal Mucosa; Nasal Swab  Result Value Ref Range Status   MRSA, PCR NEGATIVE NEGATIVE Final   Staphylococcus aureus NEGATIVE NEGATIVE Final    Comment: (NOTE) The Xpert SA Assay (FDA approved for NASAL specimens in patients 49 years of age and older), is one component of a comprehensive surveillance program. It is not intended to diagnose infection nor to guide or monitor treatment. Performed at [facility] [address], [place], [place] [address]     Labs: CBC: Recent Labs  Lab 11/20/22 2053 11/24/22 0815 11/26/22 0728 11/26/22 1658 11/27/22 0351  WBC 7.5 9.5 8.3  --  9.2  NEUTROABS 5.6 7.0  --   --   --   HGB 10.6* 10.2* 7.0* 12.1 12.2  HCT 33.5* 30.1* 21.7* 36.6 36.8  MCV 65.6* 63.2* 65.8*  --  71.2*  PLT 197 183 164  --  190   Basic Metabolic Panel: Recent Labs  Lab 11/20/22 2053 11/24/22 0815 11/25/22 0738 11/25/22 1236 11/26/22 0728 11/27/22 0351  NA 130* 121* 132*  --  136 134*  K 4.1 3.6 3.4*  --  3.8 4.7  CL 96* 88* 96*  --  107 100  CO2 24 25 27   --  21* 25  GLUCOSE 126* 135* 97  --  118* 112*  BUN 21 10 11   --  13 15  CREATININE 0.78 0.55 0.69  --  0.56 0.65  CALCIUM 9.1 8.4* 7.9*  --  7.0* 8.4*  MG  --   --   --  1.8  --   --    Liver Function Tests: Recent Labs  Lab 11/24/22 0815  AST 27  ALT 25  ALKPHOS 45  BILITOT 0.9  PROT 6.2*  ALBUMIN 3.8   CBG: No results for input(s): "GLUCAP" in the last 168 hours.  Discharge time spent: greater than 30 minutes.  Signed: Thad Ranger, MD [facility] 11/27/2022

## 2022-11-27 NOTE — Care Management Important Message (Signed)
Important Message  Patient Details IM Letter given. Name: NATALIYA GRAIG MRN: [id number] Date of Birth: Sep 25, 1940   Medicare Important Message Given:  Yes     Caren Macadam 11/27/2022, 1:16 PM

## 2022-11-27 NOTE — Progress Notes (Signed)
Called back and report give to staff at [facility].

## 2022-11-27 NOTE — Progress Notes (Addendum)
Physical Therapy Treatment Patient Details Name: Abigail Shah MRN: [id number] DOB: 1941/03/09 Today's Date: 11/27/2022   History of Present Illness 82 year old female admitted with multiple falls, c/o of right knee pain. Patient was seen in the ED on 6/3 for fall at home resulting in proximal right humeral fracture. prior to this admission pt ws being  assisted into her wheelchair,  right knee buckled,  pt fell backwards into the wheelchair.  pt c/o R knee pain. Imaging showed subcapital acute right femoral neck fracture. Right knee imaging negative for  fracture. Orthopedics consulted. pt is s/p R THA, AA on 11/25/22.  PMH: osteopenia, HLP    PT Comments    Pt progressing very well this session. Son and dtr present, supportive.  Pt able to amb ~ 16' with single L hand drive on RW, min assist of 2 for balance and safety, fatigues after this distance and seated rest provided. SPO2=95%, O2 replaced at 1.5L. encouraged to stay off purewick and use BSC with staff assist in day time if possible. Pt is motivated to mobilize and work with PT. Will benefit from post acute rehab.   Recommendations for follow up therapy are one component of a multi-disciplinary discharge planning process, led by the attending physician.  Recommendations may be updated based on patient status, additional functional criteria and insurance authorization.  Follow Up Recommendations  Can patient physically be transported by private vehicle: No    Assistance Recommended at Discharge Frequent or constant Supervision/Assistance  Patient can return home with the following A little help with walking and/or transfers;A little help with bathing/dressing/bathroom;Assist for transportation;Help with stairs or ramp for entrance   Equipment Recommendations  None recommended by PT    Recommendations for Other Services       Precautions / Restrictions Precautions Precautions: Fall Precaution Comments: pt endorses multiple  recent falls, hx R knee buckling Required Braces or Orthoses: Sling Restrictions Weight Bearing Restrictions: Yes RUE Weight Bearing: Non weight bearing RLE Weight Bearing: Weight bearing as tolerated Other Position/Activity Restrictions: presumed NWB R UE, RUE  sling     Mobility  Bed Mobility   Bed Mobility: Supine to Sit     Supine to sit: Min assist, Mod assist     General bed mobility comments: verbal cues for sequence and self assist. incr time and assist needed for reciprocal scooting to EOB. good pt effort    Transfers Overall transfer level: Needs assistance Equipment used: Rolling walker (2 wheels) Transfers: Sit to/from Stand Sit to Stand: Min assist, +2 safety/equipment           General transfer comment: assist to rise and stabilize, compensatory wide BOS; assist to steady in standing.    Ambulation/Gait Ambulation/Gait assistance: Min assist, +2 physical assistance, +2 safety/equipment Gait Distance (Feet): 16 Feet Assistive device: Rolling walker (2 wheels) Gait Pattern/deviations: Step-to pattern       General Gait Details: single hand drive on RW. multi-modal cues for sequence, RW use L UE only - sling in place RUE,  R knee extension in stance - tactile cues provided to prevent buckling. assist to steady when wt shifting, advancing LLE. min assist of 2 for safety, chair follow   Stairs             Wheelchair Mobility    Modified Rankin (Stroke Patients Only)       Balance Overall balance assessment: Needs assistance, History of Falls Sitting-balance support: Single extremity supported, No upper extremity supported, Feet supported Sitting balance-Leahy Scale:  Fair     Standing balance support: Single extremity supported, During functional activity, Reliant on assistive device for balance Standing balance-Leahy Scale: Poor                              Cognition Arousal/Alertness: Awake/alert Behavior During Therapy:  WFL for tasks assessed/performed Overall Cognitive Status: Within Functional Limits for tasks assessed                                 General Comments: requires redirection to task at times        Exercises General Exercises - Lower Extremity Long Arc Quad: AROM, Right, 10 reps    General Comments        Pertinent Vitals/Pain Pain Assessment Pain Assessment: Faces Faces Pain Scale: Hurts a little bit Pain Location: R knee Pain Descriptors / Indicators: Sore Pain Intervention(s): Limited activity within patient's tolerance, Monitored during session, Repositioned, Ice applied    Home Living                          Prior Function            PT Goals (current goals can now be found in the care plan section) Acute Rehab PT Goals Patient Stated Goal: stop falling PT Goal Formulation: With patient Time For Goal Achievement: 12/10/22 Potential to Achieve Goals: Good Progress towards PT goals: Progressing toward goals    Frequency    Min 1X/week      PT Plan Current plan remains appropriate    Co-evaluation              AM-PAC PT "6 Clicks" Mobility   Outcome Measure  Help needed turning from your back to your side while in a flat bed without using bedrails?: A Lot Help needed moving from lying on your back to sitting on the side of a flat bed without using bedrails?: A Lot Help needed moving to and from a bed to a chair (including a wheelchair)?: A Little Help needed standing up from a chair using your arms (e.g., wheelchair or bedside chair)?: A Little Help needed to walk in hospital room?: A Little Help needed climbing 3-5 steps with a railing? : Total 6 Click Score: 14    End of Session Equipment Utilized During Treatment: Gait belt Activity Tolerance: Patient tolerated treatment well Patient left: in chair;with call bell/phone within reach;with chair alarm set;with family/visitor present Nurse Communication: Mobility  status PT Visit Diagnosis: Other abnormalities of gait and mobility (R26.89);Difficulty in walking, not elsewhere classified (R26.2)     Time: 1610-9604 PT Time Calculation (min) (ACUTE ONLY): 30 min  Charges:  $Gait Training: 8-22 mins $Therapeutic Activity: 8-22 mins                     Delice Bison, PT  Acute Rehab Dept [facility]) [phone]  11/27/2022    [facility] 11/27/2022, 10:43 AM

## 2022-11-27 NOTE — [place] FL2 (Signed)
[place] MEDICAID FL2 LEVEL OF CARE FORM     IDENTIFICATION  Patient Name: Abigail Shah Birthdate: 1940/11/15 Sex: female Admission Date (Current Location): 11/24/2022  [facility] and [place] Number:  Producer, television/film/video and Address:  [facility],  [address], [place] [address]      Provider Number: [id number]  Attending Physician Name and Address:  Cathren Harsh, MD  Relative Name and Phone Number:  Willette Pa (brother) Ph: [phone]    Current Level of Care: Hospital Recommended Level of Care: Skilled Nursing Facility Prior Approval Number:    Date Approved/Denied:   PASRR Number: [id number]  Discharge Plan: SNF    Current Diagnoses: Patient Active Problem List   Diagnosis Date Noted   Acute postoperative anemia due to expected blood loss 11/26/2022   Closed right hip fracture, initial encounter (HCC) 11/24/2022   Hyponatremia 11/24/2022   Vomiting 11/24/2022   Postmenopausal bleeding 10/23/2014   Leukopenia 05/20/2014   Osteoporosis 08/12/2007   Hyperlipidemia 07/04/2007   SKIN CANCER, HX OF 07/03/2007   THALASSEMIA NEC 02/15/2007    Orientation RESPIRATION BLADDER Height & Weight     Self, Time, Situation, Place  O2 (1-2L/min) Incontinent Weight: 153 lb (69.4 kg) Height:  5\' 3"  (160 cm)  BEHAVIORAL SYMPTOMS/MOOD NEUROLOGICAL BOWEL NUTRITION STATUS   (N/A)  (N/A) Continent Diet (Regular diet)  AMBULATORY STATUS COMMUNICATION OF NEEDS Skin   Limited Assist Verbally Surgical wounds, Other (Comment) (Ecchymosis: right arm, knee)                       Personal Care Assistance Level of Assistance  Bathing, Feeding, Dressing Bathing Assistance: Limited assistance Feeding assistance: Independent Dressing Assistance: Limited assistance     Functional Limitations Info  Sight, Hearing, Speech Sight Info: Impaired Hearing Info: Impaired Speech Info: Adequate    SPECIAL CARE FACTORS FREQUENCY  PT (By licensed PT), OT (By  licensed OT)     PT Frequency: 5x's/week OT Frequency: 5x's/week            Contractures Contractures Info: Not present    Additional Factors Info  Code Status, Allergies, Psychotropic Code Status Info: Full Allergies Info: NKA Psychotropic Info: Desyrel         Current Medications (11/27/2022):  This is the current hospital active medication list Current Facility-Administered Medications  Medication Dose Route Frequency Provider Last Rate Last Admin   acetaminophen (TYLENOL) tablet 325-650 mg  325-650 mg Oral Q6H PRN Samson Frederic, MD   650 mg at 11/26/22 2318   albuterol (PROVENTIL) (2.5 MG/3ML) 0.083% nebulizer solution 2.5 mg  2.5 mg Nebulization Q2H PRN Swinteck, Arlys John, MD       alum & mag hydroxide-simeth (MAALOX/MYLANTA) 200-200-20 MG/5ML suspension 30 mL  30 mL Oral Q4H PRN Swinteck, Arlys John, MD       aspirin chewable tablet 81 mg  81 mg Oral BID Samson Frederic, MD   81 mg at 11/27/22 0841   atorvastatin (LIPITOR) tablet 10 mg  10 mg Oral QPM Swinteck, Arlys John, MD   10 mg at 11/26/22 1814   diphenhydrAMINE (BENADRYL) 12.5 MG/5ML elixir 12.5-25 mg  12.5-25 mg Oral Q4H PRN Swinteck, Arlys John, MD       docusate sodium (COLACE) capsule 100 mg  100 mg Oral BID Samson Frederic, MD   100 mg at 11/27/22 0839   HYDROcodone-acetaminophen (NORCO) 7.5-325 MG per tablet 1-2 tablet  1-2 tablet Oral Q4H PRN Samson Frederic, MD   1 tablet at 11/26/22  1233   HYDROcodone-acetaminophen (NORCO/VICODIN) 5-325 MG per tablet 1-2 tablet  1-2 tablet Oral Q4H PRN Samson Frederic, MD   1 tablet at 11/27/22 0841   lidocaine (LIDODERM) 5 % 1 patch  1 patch Transdermal Q24H Rai, Ripudeep K, MD   1 patch at 11/26/22 1109   menthol-cetylpyridinium (CEPACOL) lozenge 3 mg  1 lozenge Oral PRN Samson Frederic, MD   3 mg at 11/26/22 2318   Or   phenol (CHLORASEPTIC) mouth spray 1 spray  1 spray Mouth/Throat PRN Swinteck, Arlys John, MD       methocarbamol (ROBAXIN) tablet 500 mg  500 mg Oral Q6H PRN Samson Frederic,  MD   500 mg at 11/26/22 2022   Or   methocarbamol (ROBAXIN) 500 mg in dextrose 5 % 50 mL IVPB  500 mg Intravenous Q6H PRN Swinteck, Arlys John, MD       metoCLOPramide (REGLAN) tablet 5-10 mg  5-10 mg Oral Q8H PRN Swinteck, Arlys John, MD       Or   metoCLOPramide (REGLAN) injection 5-10 mg  5-10 mg Intravenous Q8H PRN Swinteck, Arlys John, MD       metoprolol tartrate (LOPRESSOR) injection 5 mg  5 mg Intravenous Q6H PRN Swinteck, Arlys John, MD       morphine (PF) 2 MG/ML injection 0.5-1 mg  0.5-1 mg Intravenous Q2H PRN Swinteck, Arlys John, MD       ondansetron (ZOFRAN) tablet 4 mg  4 mg Oral Q6H PRN Swinteck, Arlys John, MD       Or   ondansetron (ZOFRAN) injection 4 mg  4 mg Intravenous Q6H PRN Swinteck, Arlys John, MD       polyethylene glycol (MIRALAX / GLYCOLAX) packet 17 g  17 g Oral Daily PRN Swinteck, Arlys John, MD       polyethylene glycol (MIRALAX / GLYCOLAX) packet 17 g  17 g Oral Daily Rai, Ripudeep K, MD   17 g at 11/27/22 0840   senna (SENOKOT) tablet 8.6 mg  1 tablet Oral BID Samson Frederic, MD   8.6 mg at 11/27/22 0840   traZODone (DESYREL) tablet 25 mg  25 mg Oral QHS PRN Swinteck, Arlys John, MD       Vitamin D (Ergocalciferol) (DRISDOL) 1.25 MG (50000 UNIT) capsule 50,000 Units  50,000 Units Oral Q7 days Cathren Harsh, MD   50,000 Units at 11/26/22 0908     Discharge Medications: Please see discharge summary for a list of discharge medications.  Relevant Imaging Results:  Relevant Lab Results:   Additional Information SSN: [id number]  Ewing Schlein, LCSW

## 2022-11-28 DIAGNOSIS — S42201A Unspecified fracture of upper end of right humerus, initial encounter for closed fracture: Secondary | ICD-10-CM | POA: Insufficient documentation

## 2022-11-28 DIAGNOSIS — R278 Other lack of coordination: Secondary | ICD-10-CM | POA: Diagnosis not present

## 2022-11-28 DIAGNOSIS — R21 Rash and other nonspecific skin eruption: Secondary | ICD-10-CM | POA: Diagnosis not present

## 2022-11-28 DIAGNOSIS — R600 Localized edema: Secondary | ICD-10-CM | POA: Diagnosis not present

## 2022-11-28 DIAGNOSIS — S42201D Unspecified fracture of upper end of right humerus, subsequent encounter for fracture with routine healing: Secondary | ICD-10-CM | POA: Diagnosis not present

## 2022-11-28 DIAGNOSIS — R2689 Other abnormalities of gait and mobility: Secondary | ICD-10-CM | POA: Diagnosis not present

## 2022-11-28 DIAGNOSIS — M6281 Muscle weakness (generalized): Secondary | ICD-10-CM | POA: Diagnosis not present

## 2022-11-29 LAB — BPAM RBC
Blood Product Expiration Date: 202407122359
Blood Product Expiration Date: 202407122359
Blood Product Expiration Date: 202407122359
Blood Product Expiration Date: 202407142359
Unit Type and Rh: 9500
Unit Type and Rh: 9500
Unit Type and Rh: 9500

## 2022-11-29 LAB — TYPE AND SCREEN
Antibody Screen: NEGATIVE
Unit division: 0
Unit division: 0
Unit division: 0

## 2022-12-05 DIAGNOSIS — D649 Anemia, unspecified: Secondary | ICD-10-CM | POA: Diagnosis not present

## 2022-12-05 DIAGNOSIS — I10 Essential (primary) hypertension: Secondary | ICD-10-CM | POA: Diagnosis not present

## 2022-12-06 DIAGNOSIS — S42201D Unspecified fracture of upper end of right humerus, subsequent encounter for fracture with routine healing: Secondary | ICD-10-CM | POA: Diagnosis not present

## 2022-12-11 DIAGNOSIS — S72031D Displaced midcervical fracture of right femur, subsequent encounter for closed fracture with routine healing: Secondary | ICD-10-CM | POA: Diagnosis not present

## 2022-12-15 DIAGNOSIS — Z7689 Persons encountering health services in other specified circumstances: Secondary | ICD-10-CM | POA: Diagnosis not present

## 2022-12-15 DIAGNOSIS — M6281 Muscle weakness (generalized): Secondary | ICD-10-CM | POA: Diagnosis not present

## 2022-12-15 DIAGNOSIS — N390 Urinary tract infection, site not specified: Secondary | ICD-10-CM | POA: Diagnosis not present

## 2022-12-18 ENCOUNTER — Telehealth: Payer: Self-pay | Admitting: Family Medicine

## 2022-12-18 NOTE — Telephone Encounter (Signed)
See below

## 2022-12-18 NOTE — Telephone Encounter (Signed)
Called [facility] and lm for pt nurse tcb.

## 2022-12-18 NOTE — Telephone Encounter (Signed)
Pt is bedridden in [facility]. Needing an order for otc medication for AZO.   Patient Name First: Abigail Last: Shah Gender: Female DOB: [birth year] Age: 82 Y 5 M 4 D Return Phone Number: [phone] (Primary) Address: City/ State/ Zip: [place]  [address] Client [facility] - Human resources officer [facility] [employer] Provider Tana Conch- MD Contact Type Call Who Is Calling Patient / Member / Family / Caregiver Call Type Triage / Clinical Relationship To Patient Self Return Phone Number [phone] (Primary) Chief Complaint Urination Pain Reason for Call Symptomatic / Request for Health Information Initial Comment Caller states she is in a facility. She have cystitis she need someone to call the facility and authorize the staff there to give her an otc medication for it She is in the facility because she broke her right arm and her hip. She is experiencing burning with urination and frequency. Translation No Nurse Assessment Nurse: Carlynn Purl, RN, Teodoro Spray Date/Time (Eastern Time): 12/16/2022 8:54:12 AM Confirm and document reason for call. If symptomatic, describe symptoms. ---Pt states she is a [facility]([address] [place], [place] [address]) room 403 as she broke her right arm and hip. Pt states since last Saturday she has had burning with urination and frequency. Pt had a UA yesterday but results not back yet. Pt states facility wont give her Azo without an order. There is not a doctor on staff today that can provide advisement. No fever. No back or flank pain. Does the patient have any new or worsening symptoms? ---Yes Will a triage be completed? ---Yes Related visit to physician within the last 2 weeks? ---No Does the PT have any chronic conditions? (i.e. diabetes, asthma, this includes High risk factors for pregnancy, etc.) ---Yes List chronic conditions. ---cystitis,  high cholesterol Is this a behavioral health or substance abuse call? ---No Guidelines Guideline Title Affirmed Question Affirmed Notes Nurse Date/Time Lamount Cohen Time) Urination Pain - Female Bedridden (e.g., CVA, chronic illness, recovering from surgery) Carlynn Purl, RN, [facility] 12/16/2022 8:57:12 AM Disp. Time Lamount Cohen Time) Disposition Final User 12/16/2022 9:10:48 AM See HCP within 4 Hours (or PCP triage) Yes Carlynn Purl, RN, Teodoro Spray Final Disposition 12/16/2022 9:10:48 AM See HCP within 4 Hours (or PCP triage) Yes Carlynn Purl, RN, Alf[facility] Flavors Disagree/Comply Comply Caller Understands Yes PreDisposition Did not know what to do Care Advice Given Per Guideline SEE HCP (OR PCP TRIAGE) WITHIN 4 HOURS: * IF OFFICE WILL BE CLOSED AND NO PCP (PRIMARY CARE PROVIDER) SECOND-LEVEL TRIAGE: You need to be seen within the next 3 or 4 hours. A nearby Urgent Care Center [facility] is often a good source of care. Another choice is to go to the ED. Go sooner if you become worse. CALL BACK IF: * You become worse Comments User: Truitt Leep, RN Date/Time Lamount Cohen Time): 12/16/2022 9:12:13 AM Dispo information relayed to pt. Also spoke with Mel Almond primary RN & Vladimir Creeks supervisor RN and relayed this information. Jaquita RN states UA results came back last night and she will contact [facility] NP. Referrals GO TO FACILITY UNDECIDED

## 2022-12-18 NOTE — Telephone Encounter (Signed)
I need her UA results ASAP. I also need urine culture obtained- can send order. I think she likely needs beyond AZO- likely needs antibiotic but I need culture obtained and UA results before [place] I will also need you to follow culture and call me or update other doctor when results come in since I will be out after Wednesday around 1 pm

## 2022-12-20 DIAGNOSIS — S72001D Fracture of unspecified part of neck of right femur, subsequent encounter for closed fracture with routine healing: Secondary | ICD-10-CM | POA: Diagnosis not present

## 2022-12-20 DIAGNOSIS — M6281 Muscle weakness (generalized): Secondary | ICD-10-CM | POA: Diagnosis not present

## 2022-12-20 DIAGNOSIS — S42201S Unspecified fracture of upper end of right humerus, sequela: Secondary | ICD-10-CM | POA: Diagnosis not present

## 2022-12-20 DIAGNOSIS — S42201D Unspecified fracture of upper end of right humerus, subsequent encounter for fracture with routine healing: Secondary | ICD-10-CM | POA: Diagnosis not present

## 2022-12-21 DIAGNOSIS — S42201D Unspecified fracture of upper end of right humerus, subsequent encounter for fracture with routine healing: Secondary | ICD-10-CM | POA: Diagnosis not present

## 2022-12-21 DIAGNOSIS — M6281 Muscle weakness (generalized): Secondary | ICD-10-CM | POA: Diagnosis not present

## 2022-12-21 DIAGNOSIS — S72001D Fracture of unspecified part of neck of right femur, subsequent encounter for closed fracture with routine healing: Secondary | ICD-10-CM | POA: Diagnosis not present

## 2022-12-21 DIAGNOSIS — S42201S Unspecified fracture of upper end of right humerus, sequela: Secondary | ICD-10-CM | POA: Diagnosis not present

## 2022-12-25 DIAGNOSIS — S72001D Fracture of unspecified part of neck of right femur, subsequent encounter for closed fracture with routine healing: Secondary | ICD-10-CM | POA: Diagnosis not present

## 2022-12-25 DIAGNOSIS — M6281 Muscle weakness (generalized): Secondary | ICD-10-CM | POA: Diagnosis not present

## 2022-12-25 DIAGNOSIS — S42201S Unspecified fracture of upper end of right humerus, sequela: Secondary | ICD-10-CM | POA: Diagnosis not present

## 2022-12-25 DIAGNOSIS — S42201D Unspecified fracture of upper end of right humerus, subsequent encounter for fracture with routine healing: Secondary | ICD-10-CM | POA: Diagnosis not present

## 2022-12-27 DIAGNOSIS — S72001D Fracture of unspecified part of neck of right femur, subsequent encounter for closed fracture with routine healing: Secondary | ICD-10-CM | POA: Diagnosis not present

## 2022-12-27 DIAGNOSIS — S42201D Unspecified fracture of upper end of right humerus, subsequent encounter for fracture with routine healing: Secondary | ICD-10-CM | POA: Diagnosis not present

## 2022-12-27 DIAGNOSIS — S42201S Unspecified fracture of upper end of right humerus, sequela: Secondary | ICD-10-CM | POA: Diagnosis not present

## 2022-12-27 DIAGNOSIS — M6281 Muscle weakness (generalized): Secondary | ICD-10-CM | POA: Diagnosis not present

## 2022-12-28 DIAGNOSIS — S42201S Unspecified fracture of upper end of right humerus, sequela: Secondary | ICD-10-CM | POA: Diagnosis not present

## 2022-12-28 DIAGNOSIS — M6281 Muscle weakness (generalized): Secondary | ICD-10-CM | POA: Diagnosis not present

## 2022-12-28 DIAGNOSIS — S42201D Unspecified fracture of upper end of right humerus, subsequent encounter for fracture with routine healing: Secondary | ICD-10-CM | POA: Diagnosis not present

## 2022-12-28 DIAGNOSIS — S72001D Fracture of unspecified part of neck of right femur, subsequent encounter for closed fracture with routine healing: Secondary | ICD-10-CM | POA: Diagnosis not present

## 2022-12-29 DIAGNOSIS — S72001D Fracture of unspecified part of neck of right femur, subsequent encounter for closed fracture with routine healing: Secondary | ICD-10-CM | POA: Diagnosis not present

## 2022-12-29 DIAGNOSIS — S42201S Unspecified fracture of upper end of right humerus, sequela: Secondary | ICD-10-CM | POA: Diagnosis not present

## 2022-12-29 DIAGNOSIS — S42201D Unspecified fracture of upper end of right humerus, subsequent encounter for fracture with routine healing: Secondary | ICD-10-CM | POA: Diagnosis not present

## 2022-12-29 DIAGNOSIS — M6281 Muscle weakness (generalized): Secondary | ICD-10-CM | POA: Diagnosis not present

## 2023-01-01 DIAGNOSIS — S72001D Fracture of unspecified part of neck of right femur, subsequent encounter for closed fracture with routine healing: Secondary | ICD-10-CM | POA: Diagnosis not present

## 2023-01-01 DIAGNOSIS — S42201S Unspecified fracture of upper end of right humerus, sequela: Secondary | ICD-10-CM | POA: Diagnosis not present

## 2023-01-01 DIAGNOSIS — S42201D Unspecified fracture of upper end of right humerus, subsequent encounter for fracture with routine healing: Secondary | ICD-10-CM | POA: Diagnosis not present

## 2023-01-01 DIAGNOSIS — M6281 Muscle weakness (generalized): Secondary | ICD-10-CM | POA: Diagnosis not present

## 2023-01-02 DIAGNOSIS — S42201S Unspecified fracture of upper end of right humerus, sequela: Secondary | ICD-10-CM | POA: Diagnosis not present

## 2023-01-02 DIAGNOSIS — S42201D Unspecified fracture of upper end of right humerus, subsequent encounter for fracture with routine healing: Secondary | ICD-10-CM | POA: Diagnosis not present

## 2023-01-02 DIAGNOSIS — M6281 Muscle weakness (generalized): Secondary | ICD-10-CM | POA: Diagnosis not present

## 2023-01-02 DIAGNOSIS — S72001D Fracture of unspecified part of neck of right femur, subsequent encounter for closed fracture with routine healing: Secondary | ICD-10-CM | POA: Diagnosis not present

## 2023-01-03 DIAGNOSIS — S72001D Fracture of unspecified part of neck of right femur, subsequent encounter for closed fracture with routine healing: Secondary | ICD-10-CM | POA: Diagnosis not present

## 2023-01-03 DIAGNOSIS — E785 Hyperlipidemia, unspecified: Secondary | ICD-10-CM | POA: Diagnosis not present

## 2023-01-03 DIAGNOSIS — S42201D Unspecified fracture of upper end of right humerus, subsequent encounter for fracture with routine healing: Secondary | ICD-10-CM | POA: Diagnosis not present

## 2023-01-03 DIAGNOSIS — M6281 Muscle weakness (generalized): Secondary | ICD-10-CM | POA: Diagnosis not present

## 2023-01-03 DIAGNOSIS — S42201S Unspecified fracture of upper end of right humerus, sequela: Secondary | ICD-10-CM | POA: Diagnosis not present

## 2023-01-04 DIAGNOSIS — M6281 Muscle weakness (generalized): Secondary | ICD-10-CM | POA: Diagnosis not present

## 2023-01-04 DIAGNOSIS — S72001D Fracture of unspecified part of neck of right femur, subsequent encounter for closed fracture with routine healing: Secondary | ICD-10-CM | POA: Diagnosis not present

## 2023-01-04 DIAGNOSIS — S42201D Unspecified fracture of upper end of right humerus, subsequent encounter for fracture with routine healing: Secondary | ICD-10-CM | POA: Diagnosis not present

## 2023-01-04 DIAGNOSIS — S42201S Unspecified fracture of upper end of right humerus, sequela: Secondary | ICD-10-CM | POA: Diagnosis not present

## 2023-01-05 DIAGNOSIS — R2689 Other abnormalities of gait and mobility: Secondary | ICD-10-CM | POA: Diagnosis not present

## 2023-01-05 DIAGNOSIS — S42201D Unspecified fracture of upper end of right humerus, subsequent encounter for fracture with routine healing: Secondary | ICD-10-CM | POA: Diagnosis not present

## 2023-01-05 DIAGNOSIS — M6281 Muscle weakness (generalized): Secondary | ICD-10-CM | POA: Diagnosis not present

## 2023-01-05 DIAGNOSIS — S42201S Unspecified fracture of upper end of right humerus, sequela: Secondary | ICD-10-CM | POA: Diagnosis not present

## 2023-01-05 DIAGNOSIS — R278 Other lack of coordination: Secondary | ICD-10-CM | POA: Diagnosis not present

## 2023-01-05 DIAGNOSIS — S72001D Fracture of unspecified part of neck of right femur, subsequent encounter for closed fracture with routine healing: Secondary | ICD-10-CM | POA: Diagnosis not present

## 2023-01-07 DIAGNOSIS — S72001D Fracture of unspecified part of neck of right femur, subsequent encounter for closed fracture with routine healing: Secondary | ICD-10-CM | POA: Diagnosis not present

## 2023-01-07 DIAGNOSIS — S42201S Unspecified fracture of upper end of right humerus, sequela: Secondary | ICD-10-CM | POA: Diagnosis not present

## 2023-01-07 DIAGNOSIS — M6281 Muscle weakness (generalized): Secondary | ICD-10-CM | POA: Diagnosis not present

## 2023-01-07 DIAGNOSIS — S42201D Unspecified fracture of upper end of right humerus, subsequent encounter for fracture with routine healing: Secondary | ICD-10-CM | POA: Diagnosis not present

## 2023-01-08 DIAGNOSIS — S42201D Unspecified fracture of upper end of right humerus, subsequent encounter for fracture with routine healing: Secondary | ICD-10-CM | POA: Diagnosis not present

## 2023-01-08 DIAGNOSIS — M6281 Muscle weakness (generalized): Secondary | ICD-10-CM | POA: Diagnosis not present

## 2023-01-08 DIAGNOSIS — S72001D Fracture of unspecified part of neck of right femur, subsequent encounter for closed fracture with routine healing: Secondary | ICD-10-CM | POA: Diagnosis not present

## 2023-01-08 DIAGNOSIS — E119 Type 2 diabetes mellitus without complications: Secondary | ICD-10-CM | POA: Diagnosis not present

## 2023-01-08 DIAGNOSIS — S42201S Unspecified fracture of upper end of right humerus, sequela: Secondary | ICD-10-CM | POA: Diagnosis not present

## 2023-01-08 DIAGNOSIS — S72031D Displaced midcervical fracture of right femur, subsequent encounter for closed fracture with routine healing: Secondary | ICD-10-CM | POA: Diagnosis not present

## 2023-01-09 DIAGNOSIS — S42201A Unspecified fracture of upper end of right humerus, initial encounter for closed fracture: Secondary | ICD-10-CM | POA: Diagnosis not present

## 2023-01-11 DIAGNOSIS — R278 Other lack of coordination: Secondary | ICD-10-CM | POA: Diagnosis not present

## 2023-01-11 DIAGNOSIS — M6281 Muscle weakness (generalized): Secondary | ICD-10-CM | POA: Diagnosis not present

## 2023-01-11 DIAGNOSIS — R2689 Other abnormalities of gait and mobility: Secondary | ICD-10-CM | POA: Diagnosis not present

## 2023-01-11 DIAGNOSIS — S42201S Unspecified fracture of upper end of right humerus, sequela: Secondary | ICD-10-CM | POA: Diagnosis not present

## 2023-01-12 DIAGNOSIS — S42201S Unspecified fracture of upper end of right humerus, sequela: Secondary | ICD-10-CM | POA: Diagnosis not present

## 2023-01-12 DIAGNOSIS — M6281 Muscle weakness (generalized): Secondary | ICD-10-CM | POA: Diagnosis not present

## 2023-01-12 DIAGNOSIS — R278 Other lack of coordination: Secondary | ICD-10-CM | POA: Diagnosis not present

## 2023-01-12 DIAGNOSIS — R2689 Other abnormalities of gait and mobility: Secondary | ICD-10-CM | POA: Diagnosis not present

## 2023-01-15 DIAGNOSIS — R2689 Other abnormalities of gait and mobility: Secondary | ICD-10-CM | POA: Diagnosis not present

## 2023-01-15 DIAGNOSIS — R278 Other lack of coordination: Secondary | ICD-10-CM | POA: Diagnosis not present

## 2023-01-15 DIAGNOSIS — S42201S Unspecified fracture of upper end of right humerus, sequela: Secondary | ICD-10-CM | POA: Diagnosis not present

## 2023-01-15 DIAGNOSIS — M6281 Muscle weakness (generalized): Secondary | ICD-10-CM | POA: Diagnosis not present

## 2023-01-16 DIAGNOSIS — M1711 Unilateral primary osteoarthritis, right knee: Secondary | ICD-10-CM | POA: Diagnosis not present

## 2023-01-17 DIAGNOSIS — S42201S Unspecified fracture of upper end of right humerus, sequela: Secondary | ICD-10-CM | POA: Diagnosis not present

## 2023-01-17 DIAGNOSIS — R278 Other lack of coordination: Secondary | ICD-10-CM | POA: Diagnosis not present

## 2023-01-17 DIAGNOSIS — M6281 Muscle weakness (generalized): Secondary | ICD-10-CM | POA: Diagnosis not present

## 2023-01-17 DIAGNOSIS — R2689 Other abnormalities of gait and mobility: Secondary | ICD-10-CM | POA: Diagnosis not present

## 2023-01-19 DIAGNOSIS — R278 Other lack of coordination: Secondary | ICD-10-CM | POA: Diagnosis not present

## 2023-01-19 DIAGNOSIS — R2681 Unsteadiness on feet: Secondary | ICD-10-CM | POA: Diagnosis not present

## 2023-01-19 DIAGNOSIS — M6281 Muscle weakness (generalized): Secondary | ICD-10-CM | POA: Diagnosis not present

## 2023-01-19 DIAGNOSIS — R2689 Other abnormalities of gait and mobility: Secondary | ICD-10-CM | POA: Diagnosis not present

## 2023-01-19 DIAGNOSIS — S42201S Unspecified fracture of upper end of right humerus, sequela: Secondary | ICD-10-CM | POA: Diagnosis not present

## 2023-01-22 DIAGNOSIS — R2681 Unsteadiness on feet: Secondary | ICD-10-CM | POA: Diagnosis not present

## 2023-01-22 DIAGNOSIS — S42201S Unspecified fracture of upper end of right humerus, sequela: Secondary | ICD-10-CM | POA: Diagnosis not present

## 2023-01-22 DIAGNOSIS — R2689 Other abnormalities of gait and mobility: Secondary | ICD-10-CM | POA: Diagnosis not present

## 2023-01-22 DIAGNOSIS — R278 Other lack of coordination: Secondary | ICD-10-CM | POA: Diagnosis not present

## 2023-01-22 DIAGNOSIS — M6281 Muscle weakness (generalized): Secondary | ICD-10-CM | POA: Diagnosis not present

## 2023-01-24 DIAGNOSIS — R2681 Unsteadiness on feet: Secondary | ICD-10-CM | POA: Diagnosis not present

## 2023-01-24 DIAGNOSIS — R2689 Other abnormalities of gait and mobility: Secondary | ICD-10-CM | POA: Diagnosis not present

## 2023-01-24 DIAGNOSIS — M6281 Muscle weakness (generalized): Secondary | ICD-10-CM | POA: Diagnosis not present

## 2023-01-24 DIAGNOSIS — S42201S Unspecified fracture of upper end of right humerus, sequela: Secondary | ICD-10-CM | POA: Diagnosis not present

## 2023-01-24 DIAGNOSIS — R278 Other lack of coordination: Secondary | ICD-10-CM | POA: Diagnosis not present

## 2023-01-26 DIAGNOSIS — R278 Other lack of coordination: Secondary | ICD-10-CM | POA: Diagnosis not present

## 2023-01-26 DIAGNOSIS — M6281 Muscle weakness (generalized): Secondary | ICD-10-CM | POA: Diagnosis not present

## 2023-01-26 DIAGNOSIS — S42201S Unspecified fracture of upper end of right humerus, sequela: Secondary | ICD-10-CM | POA: Diagnosis not present

## 2023-01-26 DIAGNOSIS — R2681 Unsteadiness on feet: Secondary | ICD-10-CM | POA: Diagnosis not present

## 2023-01-26 DIAGNOSIS — R2689 Other abnormalities of gait and mobility: Secondary | ICD-10-CM | POA: Diagnosis not present

## 2023-01-29 DIAGNOSIS — R2689 Other abnormalities of gait and mobility: Secondary | ICD-10-CM | POA: Diagnosis not present

## 2023-01-29 DIAGNOSIS — S42201S Unspecified fracture of upper end of right humerus, sequela: Secondary | ICD-10-CM | POA: Diagnosis not present

## 2023-01-29 DIAGNOSIS — M6281 Muscle weakness (generalized): Secondary | ICD-10-CM | POA: Diagnosis not present

## 2023-01-29 DIAGNOSIS — R278 Other lack of coordination: Secondary | ICD-10-CM | POA: Diagnosis not present

## 2023-01-29 DIAGNOSIS — R2681 Unsteadiness on feet: Secondary | ICD-10-CM | POA: Diagnosis not present

## 2023-01-30 ENCOUNTER — Telehealth: Payer: Self-pay | Admitting: Family Medicine

## 2023-01-30 NOTE — Telephone Encounter (Signed)
Error

## 2023-01-31 DIAGNOSIS — R2689 Other abnormalities of gait and mobility: Secondary | ICD-10-CM | POA: Diagnosis not present

## 2023-01-31 DIAGNOSIS — R278 Other lack of coordination: Secondary | ICD-10-CM | POA: Diagnosis not present

## 2023-01-31 DIAGNOSIS — R2681 Unsteadiness on feet: Secondary | ICD-10-CM | POA: Diagnosis not present

## 2023-01-31 DIAGNOSIS — M6281 Muscle weakness (generalized): Secondary | ICD-10-CM | POA: Diagnosis not present

## 2023-01-31 DIAGNOSIS — S42201S Unspecified fracture of upper end of right humerus, sequela: Secondary | ICD-10-CM | POA: Diagnosis not present

## 2023-02-01 ENCOUNTER — Telehealth: Payer: Self-pay | Admitting: Family Medicine

## 2023-02-01 ENCOUNTER — Ambulatory Visit: Payer: Medicare Other | Admitting: Dermatology

## 2023-02-01 ENCOUNTER — Encounter: Payer: Self-pay | Admitting: Dermatology

## 2023-02-01 VITALS — BP 159/100 | HR 85

## 2023-02-01 DIAGNOSIS — W908XXA Exposure to other nonionizing radiation, initial encounter: Secondary | ICD-10-CM

## 2023-02-01 DIAGNOSIS — L578 Other skin changes due to chronic exposure to nonionizing radiation: Secondary | ICD-10-CM | POA: Diagnosis not present

## 2023-02-01 DIAGNOSIS — L814 Other melanin hyperpigmentation: Secondary | ICD-10-CM | POA: Diagnosis not present

## 2023-02-01 DIAGNOSIS — Z1283 Encounter for screening for malignant neoplasm of skin: Secondary | ICD-10-CM | POA: Diagnosis not present

## 2023-02-01 DIAGNOSIS — L821 Other seborrheic keratosis: Secondary | ICD-10-CM

## 2023-02-01 DIAGNOSIS — D1801 Hemangioma of skin and subcutaneous tissue: Secondary | ICD-10-CM | POA: Diagnosis not present

## 2023-02-01 DIAGNOSIS — D229 Melanocytic nevi, unspecified: Secondary | ICD-10-CM

## 2023-02-01 NOTE — Telephone Encounter (Signed)
Patient dropped off document Handicap Placard, to be filled out by provider. Patient requested to send it back via Call Patient to pick up within 5-days. Document is located in providers tray at front office.Please advise at Mobile [phone] (mobile)

## 2023-02-01 NOTE — Progress Notes (Signed)
   New Patient Visit   Subjective  Abigail Shah is a 82 y.o. female who presents for the following: TBSE  Patient present today for new patient visit for TBSE. Patient reports throughout her lifetime has had moderate sun exposure. Currently, patient reports if she has excessive sun exposure, She does apply sunscreen and/or wears protective coverings. Patient reports Hx of bx. In May of 2003 pt had BCC (Left Scalp) & 2 Atypical Nevus (Left lower back and right Upper Posterior Arm), 05/21/2003 Atypical Nevus (Right Fifth toe), 11/23/2009 BCC (Left Forehead), 08/01/2011 BCC (Left Upper Eye Lid), 11/27/2012 Atypical Nevus (Right Hip), 12/27/2016 (Left Neck). Patient reports she has no areas or spots of concern today. Patient reports family history of skin cancer(s) (Brother and Dad).  The following portions of the chart were reviewed this encounter and updated as appropriate: medications, allergies, medical history  Review of Systems:  No other skin or systemic complaints except as noted in HPI or Assessment and Plan.  Objective  Well appearing patient in no apparent distress; mood and affect are within normal limits.  A full examination was performed including scalp, head, eyes, ears, nose, lips, neck, chest, axillae, abdomen, back, buttocks, bilateral upper extremities, bilateral lower extremities, hands, feet, fingers, toes, fingernails, and toenails. All findings within normal limits unless otherwise noted below.   Relevant exam findings are noted in the Assessment and Plan.   Assessment & Plan   LENTIGINES, SEBORRHEIC KERATOSES, CHERRY ANGIOMAS - Benign normal skin lesions - Benign-appearing - Call for any changes  MELANOCYTIC NEVI - Tan-brown and/or pink-flesh-colored symmetric macules and papules - Benign appearing on exam today - Observation - Call clinic for new or changing moles - Recommend daily use of broad spectrum spf 30+ sunscreen to sun-exposed areas.   MILD ACTINIC  DAMAGE - Chronic condition, secondary to cumulative UV/sun exposure - diffuse scaly erythematous macules with underlying dyspigmentation - Recommend daily broad spectrum sunscreen SPF 30+ to sun-exposed areas, reapply every 2 hours as needed.  - Staying in the shade or wearing long sleeves, sun glasses (UVA+UVB protection) and wide brim hats (4-inch brim around the entire circumference of the hat) are also recommended for sun protection.  - Call for new or changing lesions.  SKIN CANCER SCREENING PERFORMED TODAY   Return in about 1 year (around 02/01/2024) for TBSE.  Documentation: I have reviewed the above documentation for accuracy and completeness, and I agree with the above.  Stasia Cavalier, am acting as scribe for Langston Reusing, DO.  Langston Reusing, DO

## 2023-02-01 NOTE — Patient Instructions (Addendum)
Hello Abigail Shah,  Thank you for visiting [place] today. Your dedication to maintaining your skin health is greatly appreciated. Here is a summary of the key instructions from today's consultation:  - Skin Care Routine: Continue with your current use of Cerave moisturizer, as it is well-suited to your skin.  - Sun Protection: Given the discomfort sunscreen causes you, opting for hats and seeking shade are recommended as effective alternatives.  - Skin Observations: The examination today confirmed your scalp is clear, and the identified bumps are benign seborrheic keratosis, affectionately known as "wisdom spots."  - Sun Exposure: There is mild actinic damage observed, which is indicative of long-term sun exposure, though it is not severe.  - Annual Skin Checks: It is crucial to maintain annual skin examinations to monitor any changes. Should you notice any new or changing spots, please make an appointment to come in sooner.  - Self-Monitoring: Endeavor to monitor your skin as thoroughly as possible. Consider enlisting the help of a neighbor or friend to check areas that are out of your view.  It was a pleasure to see you today, and I look forward to our continued partnership in supporting your skin health. Should you have any questions or concerns before your next visit, please do not hesitate to contact our office.  Warm regards,  Dr. Langston Reusing, Dermatology Important Information  Due to recent changes in healthcare laws, you may see results of your pathology and/or laboratory studies on MyChart before the doctors have had a chance to review them. We understand that in some cases there may be results that are confusing or concerning to you. Please understand that not all results are received at the same time and often the doctors may need to interpret multiple results in order to provide you with the best plan of care or course of treatment. Therefore, we ask that you please give [place] 2 business  days to thoroughly review all your results before contacting the office for clarification. Should we see a critical lab result, you will be contacted sooner.   If You Need Anything After Your Visit  If you have any questions or concerns for your doctor, please call our main line at [phone] If no one answers, please leave a voicemail as directed and we will return your call as soon as possible. Messages left after 4 pm will be answered the following business day.   You may also send [place] a message via MyChart. We typically respond to MyChart messages within 1-2 business days.  For prescription refills, please ask your pharmacy to contact our office. Our fax number is [phone].  If you have an urgent issue when the clinic is closed that cannot wait until the next business day, you can page your doctor at the number below.    Please note that while we do our best to be available for urgent issues outside of office hours, we are not available 24/7.   If you have an urgent issue and are unable to reach [place], you may choose to seek medical care at your doctor's office, retail clinic, urgent care center, or emergency room.  If you have a medical emergency, please immediately call 911 or go to the emergency department. In the event of inclement weather, please call our main line at [phone] for an update on the status of any delays or closures.  Dermatology Medication Tips: Please keep the boxes that topical medications come in in order to help keep track of the instructions  about where and how to use these. Pharmacies typically print the medication instructions only on the boxes and not directly on the medication tubes.   If your medication is too expensive, please contact our office at [phone] or send [place] a message through MyChart.   We are unable to tell what your co-pay for medications will be in advance as this is different depending on your insurance coverage. However, we may be  able to find a substitute medication at lower cost or fill out paperwork to get insurance to cover a needed medication.   If a prior authorization is required to get your medication covered by your insurance company, please allow [place] 1-2 business days to complete this process.  Drug prices often vary depending on where the prescription is filled and some pharmacies may offer cheaper prices.  The website www.goodrx.com contains coupons for medications through different pharmacies. The prices here do not account for what the cost may be with help from insurance (it may be cheaper with your insurance), but the website can give you the price if you did not use any insurance.  - You can print the associated coupon and take it with your prescription to the pharmacy.  - You may also stop by our office during regular business hours and pick up a GoodRx coupon card.  - If you need your prescription sent electronically to a different pharmacy, notify our office through [facility] or by phone at [phone]

## 2023-02-02 DIAGNOSIS — R2681 Unsteadiness on feet: Secondary | ICD-10-CM | POA: Diagnosis not present

## 2023-02-02 DIAGNOSIS — M6281 Muscle weakness (generalized): Secondary | ICD-10-CM | POA: Diagnosis not present

## 2023-02-02 DIAGNOSIS — M1711 Unilateral primary osteoarthritis, right knee: Secondary | ICD-10-CM | POA: Insufficient documentation

## 2023-02-02 DIAGNOSIS — S42201S Unspecified fracture of upper end of right humerus, sequela: Secondary | ICD-10-CM | POA: Diagnosis not present

## 2023-02-02 DIAGNOSIS — R2689 Other abnormalities of gait and mobility: Secondary | ICD-10-CM | POA: Diagnosis not present

## 2023-02-02 DIAGNOSIS — R278 Other lack of coordination: Secondary | ICD-10-CM | POA: Diagnosis not present

## 2023-02-02 NOTE — Telephone Encounter (Signed)
Form filled out and pt aware up front for pick up.

## 2023-02-05 DIAGNOSIS — R2689 Other abnormalities of gait and mobility: Secondary | ICD-10-CM | POA: Diagnosis not present

## 2023-02-05 DIAGNOSIS — R278 Other lack of coordination: Secondary | ICD-10-CM | POA: Diagnosis not present

## 2023-02-05 DIAGNOSIS — S42201S Unspecified fracture of upper end of right humerus, sequela: Secondary | ICD-10-CM | POA: Diagnosis not present

## 2023-02-05 DIAGNOSIS — M6281 Muscle weakness (generalized): Secondary | ICD-10-CM | POA: Diagnosis not present

## 2023-02-05 DIAGNOSIS — R2681 Unsteadiness on feet: Secondary | ICD-10-CM | POA: Diagnosis not present

## 2023-02-06 DIAGNOSIS — M170 Bilateral primary osteoarthritis of knee: Secondary | ICD-10-CM | POA: Diagnosis not present

## 2023-02-07 DIAGNOSIS — R2689 Other abnormalities of gait and mobility: Secondary | ICD-10-CM | POA: Diagnosis not present

## 2023-02-07 DIAGNOSIS — M6281 Muscle weakness (generalized): Secondary | ICD-10-CM | POA: Diagnosis not present

## 2023-02-07 DIAGNOSIS — R278 Other lack of coordination: Secondary | ICD-10-CM | POA: Diagnosis not present

## 2023-02-07 DIAGNOSIS — S42201S Unspecified fracture of upper end of right humerus, sequela: Secondary | ICD-10-CM | POA: Diagnosis not present

## 2023-02-07 DIAGNOSIS — R2681 Unsteadiness on feet: Secondary | ICD-10-CM | POA: Diagnosis not present

## 2023-02-12 DIAGNOSIS — M6281 Muscle weakness (generalized): Secondary | ICD-10-CM | POA: Diagnosis not present

## 2023-02-12 DIAGNOSIS — R2689 Other abnormalities of gait and mobility: Secondary | ICD-10-CM | POA: Diagnosis not present

## 2023-02-12 DIAGNOSIS — S42201S Unspecified fracture of upper end of right humerus, sequela: Secondary | ICD-10-CM | POA: Diagnosis not present

## 2023-02-12 DIAGNOSIS — R278 Other lack of coordination: Secondary | ICD-10-CM | POA: Diagnosis not present

## 2023-02-12 DIAGNOSIS — R2681 Unsteadiness on feet: Secondary | ICD-10-CM | POA: Diagnosis not present

## 2023-02-20 DIAGNOSIS — S42201D Unspecified fracture of upper end of right humerus, subsequent encounter for fracture with routine healing: Secondary | ICD-10-CM | POA: Diagnosis not present

## 2023-03-02 DIAGNOSIS — M1711 Unilateral primary osteoarthritis, right knee: Secondary | ICD-10-CM | POA: Diagnosis not present

## 2023-03-09 DIAGNOSIS — M1711 Unilateral primary osteoarthritis, right knee: Secondary | ICD-10-CM | POA: Diagnosis not present

## 2023-03-16 DIAGNOSIS — M1711 Unilateral primary osteoarthritis, right knee: Secondary | ICD-10-CM | POA: Diagnosis not present

## 2023-04-27 ENCOUNTER — Encounter: Payer: Self-pay | Admitting: Family Medicine

## 2023-04-27 ENCOUNTER — Ambulatory Visit (INDEPENDENT_AMBULATORY_CARE_PROVIDER_SITE_OTHER): Payer: Medicare Other | Admitting: Family Medicine

## 2023-04-27 VITALS — BP 138/64 | HR 88 | Temp 98.0°F | Ht 63.0 in | Wt 158.6 lb

## 2023-04-27 DIAGNOSIS — M810 Age-related osteoporosis without current pathological fracture: Secondary | ICD-10-CM

## 2023-04-27 DIAGNOSIS — Z0000 Encounter for general adult medical examination without abnormal findings: Secondary | ICD-10-CM

## 2023-04-27 DIAGNOSIS — E785 Hyperlipidemia, unspecified: Secondary | ICD-10-CM

## 2023-04-27 DIAGNOSIS — E559 Vitamin D deficiency, unspecified: Secondary | ICD-10-CM | POA: Insufficient documentation

## 2023-04-27 DIAGNOSIS — R319 Hematuria, unspecified: Secondary | ICD-10-CM | POA: Diagnosis not present

## 2023-04-27 DIAGNOSIS — D568 Other thalassemias: Secondary | ICD-10-CM

## 2023-04-27 LAB — CBC WITH DIFFERENTIAL/PLATELET
Basophils Absolute: 0 10*3/uL (ref 0.0–0.1)
Basophils Relative: 0.9 % (ref 0.0–3.0)
Eosinophils Absolute: 0.1 10*3/uL (ref 0.0–0.7)
Eosinophils Relative: 1.3 % (ref 0.0–5.0)
HCT: 35.6 % — ABNORMAL LOW (ref 36.0–46.0)
Hemoglobin: 11.4 g/dL — ABNORMAL LOW (ref 12.0–15.0)
Lymphocytes Relative: 41.3 % (ref 12.0–46.0)
Lymphs Abs: 2.1 10*3/uL (ref 0.7–4.0)
MCHC: 31.9 g/dL (ref 30.0–36.0)
MCV: 66.7 fL — ABNORMAL LOW (ref 78.0–100.0)
Monocytes Absolute: 0.4 10*3/uL (ref 0.1–1.0)
Monocytes Relative: 7.5 % (ref 3.0–12.0)
Neutro Abs: 2.5 10*3/uL (ref 1.4–7.7)
Neutrophils Relative %: 49 % (ref 43.0–77.0)
Platelets: 251 10*3/uL (ref 150.0–400.0)
RBC: 5.34 Mil/uL — ABNORMAL HIGH (ref 3.87–5.11)
RDW: 15.6 % — ABNORMAL HIGH (ref 11.5–15.5)
WBC: 5 10*3/uL (ref 4.0–10.5)

## 2023-04-27 LAB — URINALYSIS, ROUTINE W REFLEX MICROSCOPIC
Bilirubin Urine: NEGATIVE
Hgb urine dipstick: NEGATIVE
Ketones, ur: NEGATIVE
Leukocytes,Ua: NEGATIVE
Nitrite: NEGATIVE
RBC / HPF: NONE SEEN (ref 0–?)
Specific Gravity, Urine: 1.015 (ref 1.000–1.030)
Total Protein, Urine: NEGATIVE
Urine Glucose: NEGATIVE
Urobilinogen, UA: 0.2 (ref 0.0–1.0)
pH: 6 (ref 5.0–8.0)

## 2023-04-27 LAB — COMPREHENSIVE METABOLIC PANEL
ALT: 15 U/L (ref 0–35)
AST: 20 U/L (ref 0–37)
Albumin: 4.5 g/dL (ref 3.5–5.2)
Alkaline Phosphatase: 67 U/L (ref 39–117)
BUN: 19 mg/dL (ref 6–23)
CO2: 27 meq/L (ref 19–32)
Calcium: 9.4 mg/dL (ref 8.4–10.5)
Chloride: 104 meq/L (ref 96–112)
Creatinine, Ser: 0.83 mg/dL (ref 0.40–1.20)
GFR: 64.57 mL/min (ref >60.00)
Glucose, Bld: 103 mg/dL — ABNORMAL HIGH (ref 70–99)
Potassium: 4 meq/L (ref 3.5–5.1)
Sodium: 138 meq/L (ref 135–145)
Total Bilirubin: 0.7 mg/dL (ref 0.2–1.2)
Total Protein: 6.8 g/dL (ref 6.0–8.3)

## 2023-04-27 LAB — LIPID PANEL
Cholesterol: 212 mg/dL — ABNORMAL HIGH (ref 0–200)
HDL: 61.7 mg/dL (ref >39.00)
LDL Cholesterol: 127 mg/dL — ABNORMAL HIGH (ref 0–99)
NonHDL: 150.33
Total CHOL/HDL Ratio: 3
Triglycerides: 115 mg/dL (ref 0.0–149.0)
VLDL: 23 mg/dL (ref 0.0–40.0)

## 2023-04-27 LAB — VITAMIN D 25 HYDROXY (VIT D DEFICIENCY, FRACTURES): VITD: 31.33 ng/mL (ref 30.00–100.00)

## 2023-04-27 MED ORDER — ATORVASTATIN CALCIUM 10 MG PO TABS: 10.0000 mg | ORAL_TABLET | Freq: Every day | ORAL | 3 refills | Status: DC

## 2023-04-27 NOTE — Patient Instructions (Addendum)
Please check with your pharmacy to see if they have the shingrix vaccine. If they do- please get this immunization and update [place] by phone call or mychart with dates you receive the vaccine  Also eligible for Tetanus, Diphtheria, and Pertussis (Tdap) - will be cheaper at pharmacy  You are eligible to schedule your annual wellness visit with our nurse specialist Inetta Fermo.  Please consider scheduling this before you leave today  Calcium: 1200mg  (through diet ok) recommended - she is doing more milk Vitamin D: 1000 units a day recommended  Schedule your bone density test at check out desk.  - located [address] across the street from [place] - in the basement - you DO NEED an appointment for the bone density tests.    Please stop by lab before you go If you have mychart- we will send your results within 3 business days of [place] receiving them.  If you do not have mychart- we will call you about results within 5 business days of [place] receiving them.  *please also note that you will see labs on mychart as soon as they post. I will later go in and write notes on them- will say "notes from Dr. Durene Cal"   Recommended follow up: Return in about 1 year (around 04/26/2024) for physical or sooner if needed.Schedule b4 you leave.

## 2023-04-27 NOTE — Progress Notes (Signed)
Phone [phone]   Subjective:  Patient presents today for their annual physical. Chief complaint-noted.   See problem oriented charting- ROS- full  review of systems was completed and negative except for: right lateral leg numbness, right knee pain, nocturia every 3 hours (opting out o oab medicines) but drinks late  The following were reviewed and entered/updated in epic: Past Medical History:  Diagnosis Date   Anemia    Atypical nevus 05/21/2003   Right Fifth Toe-Slight   Atypical nevus 11/27/2012   Right Hip-Moderate   Atypical nevus 12/27/2016   Left Neck-Moderate(widershave)   Atypical nevus x 2 10/21/2001   Left Lower Back-Slight to Moderate and Right Post Upper Arm- Slight   BCC (basal cell carcinoma of skin) 10/21/2001   Left Side Scalp   BCC (basal cell carcinoma of skin) 11/23/2009   Left Forehead(curet and cautery)   BCC (basal cell carcinoma of skin) 08/01/2011   Left Upper Lip(curet)   Hyperlipidemia    Osteopenia    Skin cancer    hx of scalp   Thalassemia    Patient Active Problem List   Diagnosis Date Noted   Osteoporosis 08/12/2007    Priority: Medium    Hyperlipidemia 07/04/2007    Priority: Medium    THALASSEMIA NEC 02/15/2007    Priority: Medium    Postmenopausal bleeding 10/23/2014    Priority: Low   Leukopenia 05/20/2014    Priority: Low   SKIN CANCER, HX OF 07/03/2007    Priority: Low   Vitamin D deficiency 04/27/2023   Closed fracture of proximal end of right humerus 11/28/2022   Acute postoperative anemia due to expected blood loss 11/26/2022   Closed right hip fracture, initial encounter (HCC) 11/24/2022   Hyponatremia 11/24/2022   Vomiting 11/24/2022   Right knee pain 11/21/2022   Pain in joint of left shoulder 10/28/2022   BPPV (benign paroxysmal positional vertigo), unspecified laterality 08/05/2021   Bilateral hearing loss 08/05/2021   Hammer toe 08/02/2020   Acquired hallux varus 08/02/2020   Pain in left knee 08/06/2018    Past Surgical History:  Procedure Laterality Date   APPENDECTOMY     BUNIONECTOMY     complicated with fractured toe x 2 feet   CATARACT EXTRACTION     bilateral   CESAREAN SECTION     x2   COLONOSCOPY  2003   DILATION AND CURETTAGE OF UTERUS     miscarriage     x2   skin cancer scalp     TONSILLECTOMY AND ADENOIDECTOMY     TOTAL HIP ARTHROPLASTY Right 11/25/2022   Procedure: TOTAL HIP ARTHROPLASTY ANTERIOR APPROACH;  Surgeon: Samson Frederic, MD;  Location: [facility];  Service: Orthopedics;  Laterality: Right;   TUBAL LIGATION     vertigo      Family History  Problem Relation Age of Onset   Dementia Mother    Hyperlipidemia Father    Heart attack Father 91   Multiple sclerosis Brother        actually radiation poisoning   Hyperlipidemia Other    Stroke Other    Breast cancer Neg Hx     Medications- reviewed and updated Current Outpatient Medications  Medication Sig Dispense Refill   acetaminophen (TYLENOL) 650 MG CR tablet Take 650 mg by mouth every 6 (six) hours as needed for pain.     atorvastatin (LIPITOR) 10 MG tablet Take 1 tablet (10 mg total) by mouth daily. (Patient taking differently: Take 10 mg by mouth every evening.) 90  tablet 3   bisacodyl (DULCOLAX) 10 MG suppository Place 10 mg rectally as needed for moderate constipation.     Calcium Carbonate-Vitamin D 500-3.125 MG-MCG TABS Take by mouth.     Cyanocobalamin (B-12) 1000 MCG SUBL Place under the tongue.     docusate sodium (COLACE) 100 MG capsule Take 1 capsule (100 mg total) by mouth 2 (two) times daily. 10 capsule 0   ondansetron (ZOFRAN) 4 MG tablet Take 4 mg by mouth every 6 (six) hours as needed for nausea or vomiting.     polyethylene glycol (MIRALAX / GLYCOLAX) 17 g packet Take 17 g by mouth daily.     Vitamin D, Ergocalciferol, (DRISDOL) 1.25 MG (50000 UNIT) CAPS capsule Take 1 capsule (50,000 Units total) by mouth every 7 (seven) days. 5 capsule    No current facility-administered medications for  this visit.    Allergies-reviewed and updated No Known Allergies  Social History   Social History Narrative   Lives alone. Widowed 1987. 2 children. 4 grandkids. No greatgrandkids.    Lived in [place] since going to [employer] college (livesd away for a few years)      Retired from Physiological scientist. Still doing a lot.       Hobbies: a lot of travel because son lives overseas mostly, daughter in [place].    Same church since 1962.       Advanced directives. HCPOA- both children. DNR   Objective  Objective:  BP 138/64   Pulse 88   Temp 98 F (36.7 C)   Ht 5\' 3"  (1.6 m)   Wt 158 lb 9.6 oz (71.9 kg)   LMP  (LMP Unknown)   SpO2 99%   BMI 28.09 kg/m  Gen: NAD, resting comfortably HEENT: Mucous membranes are moist. Oropharynx normal, narrow ear canals Neck: no thyromegaly CV: RRR no murmurs rubs or gallops Lungs: CTAB no crackles, wheeze, rhonchi Abdomen: soft/nontender/nondistended/normal bowel sounds. No rebound or guarding.  Ext: no edema Skin: warm, dry Neuro: grossly normal, moves all extremities, PERRLA   Assessment and Plan   82 y.o. female presenting for annual physical.  Health Maintenance counseling: 1. Anticipatory guidance: Patient counseled regarding regular dental exams -q6 months, eye exams - yearly,  avoiding smoking and second hand smoke , limiting alcohol to 1 beverage per day- rare , no illicit drugs .   2. Risk factor reduction:  Advised patient of need for regular exercise and diet rich and fruits and vegetables to reduce risk of heart attack and stroke.  Exercise- water aerobics 3 days a week and tone and balance on alternate days.  Diet/weight management-weight down 3 lbs from last year- still finds it harder to eat well at [employer]- she reports had lost more before her fall but hard with fracture/knee issues to maintain.  Wt Readings from Last 3 Encounters:  04/27/23 158 lb 9.6 oz (71.9 kg)  11/24/22 153 lb (69.4 kg)  11/20/22 153 lb  (69.4 kg)  3. Immunizations/screenings/ancillary studies- discussed shingrix and Tetanus, Diphtheria, and Pertussis (Tdap) at pharmacy (but she thinks she's had it). Declines COVID vaccine for now  Immunization History  Administered Date(s) Administered   DTaP 01/20/2011   Fluad Quad(high Dose 65+) 04/16/2020   Fluad Trivalent(High Dose 65+) 04/06/2023   Hepatitis A 04/27/2006, 11/01/2006   Hepatitis B 04/27/2006, 05/28/2006, 11/01/2006   Influenza Split 03/27/2011, 04/03/2012   Influenza Whole 03/19/2006, 03/17/2009   Influenza, High Dose Seasonal PF 02/20/2018, 02/26/2019, 03/24/2022   Influenza,inj,Quad PF,6+ Mos  04/14/2013   Influenza-Unspecified 04/02/2015, 04/17/2017, 04/16/2021   Meningococcal polysaccharide vaccine (MPSV4) 04/12/2006   PFIZER Comirnaty(Gray Top)Covid-19 Tri-Sucrose Vaccine 01/05/2021, 03/24/2022   PFIZER(Purple Top)SARS-COV-2 Vaccination 07/06/2019, 07/26/2019, 03/26/2020   PPD Test 05/06/2012   Pneumococcal Conjugate-13 05/20/2014   Pneumococcal Polysaccharide-23 03/19/2004   Td 03/19/2006   Zoster, Live 04/12/2006  4. Cervical cancer screening-  past age based screening recommendations .  No vaginal discharge or bleeding today-no pelvic plan today-we opted out of pelvic exam 5. Breast cancer screening-patient discontinued screening in 2020-past age based screening recommendations    6. Colon cancer screening - colonoscopy 06/19/2001 -  past age based screening  recommendations.  Normal stool cards in 2015 and 2017.  We have opted to discontinue screening.  Denies blood in stool. No melena  7. Skin cancer screening-follows with dermatology yearly Dr. Onalee Hua.   8. Birth control/STD check- not sexually active   9. Osteoporosis screening at 65-see below -Never smoker  Status of chronic or acute concerns   # Proximal right humeral fracture-occurred in June of this year.  She had total hip replacement.  Also had anemia related to this and had transfusion after  surgery- we will check blood counts today- follow up in December for this  # Orthopedic concerns-working with [facility] and has needed gel injections in the right knee- right knee buckling is what caused the pain   #hyperlipidemia S: Medication: atorvastatin 10 mg  Lab Results  Component Value Date   CHOL 225 (H) 04/25/2022   HDL 44.80 04/25/2022   LDLCALC 108 (H) 02/24/2021   LDLDIRECT 134.0 04/25/2022   TRIG 231.0 (H) 04/25/2022   CHOLHDL 5 04/25/2022   A/P: hopefully stable- update lipid panel today. Continue current meds for now - #s slightly high but at her age do not feel strongly about increasing dose   # Osteoporosis S: Last DEXA: 04/26/2021 largely stable despite being off Fosamax we opted for 2-year repeat  Medication (bisphosphonate or prolia): Fosamax May 2016 through 2021  Calcium: 1200mg  (through diet ok) recommended - she is doing more milk Vitamin D: 1000 units a day recommended A/P: hopefully stable- update dexa. Continue current meds for now  . I am not thrilled about the fracture she had but was with significant fall- so does not sound to be fragility related  #Vitamin D deficiency S: Medication: treated with high dose short term- taking vitamin D as part of a supplement Last vitamin D Lab Results  Component Value Date   VD25OH 26.71 (L) 11/25/2022  A/P: hopefully stable- update vitamin D today. Continue current meds for now    #Drooling-long-term issue worsened with Invisalign previously and then retainer  #Thalassemia-mild anemia-we will monitor -Has had leukopenia in the past but normal last year   #Dupuytren contracture on right hand-has seen Dr. Denyse Amass and Dr. Amanda Pea I believe in the past.  No surgery recommended-monitor for now -reports that she has been playing piano more and actually seems better   #prior hematuria with [facility]- may have been urinary tract infection (UTI) related- check UA today  Recommended follow up: Return in about 1 year  (around 04/26/2024) for physical or sooner if needed.Schedule b4 you leave. Future Appointments  Date Time Provider Department Center  01/29/2024 10:15 AM Terri Piedra, DO [facility] None   Lab/Order associations: fasting   ICD-10-CM   1. Preventative health care  Z00.00     2. Hyperlipidemia, unspecified hyperlipidemia type  E78.5     3. Age-related osteoporosis without current  pathological fracture  M81.0     4. Vitamin D deficiency  E55.9     5. Hematuria, unspecified type  R31.9      No orders of the defined types were placed in this encounter.  Return precautions advised.  Tana Conch, MD

## 2023-05-01 ENCOUNTER — Telehealth: Payer: Self-pay | Admitting: Family Medicine

## 2023-05-01 ENCOUNTER — Other Ambulatory Visit: Payer: Self-pay | Admitting: Family Medicine

## 2023-05-01 NOTE — Telephone Encounter (Signed)
Pt states RX  atorvastatin (LIPITOR) 10 MG tablet  Was sent to the wrong pharmacy, please resend to [id number] - [place], [place] - [address] AT [facility] [address] [phone]

## 2023-05-02 MED ORDER — ATORVASTATIN CALCIUM 10 MG PO TABS: 10.0000 mg | ORAL_TABLET | Freq: Every day | ORAL | 3 refills | Status: DC

## 2023-05-02 NOTE — Telephone Encounter (Signed)
Rx sent to correct pharmacy.

## 2023-05-02 NOTE — Addendum Note (Signed)
Addended by: Jobe Gibbon on: 05/02/2023 01:33 PM   Modules accepted: Orders

## 2023-05-03 ENCOUNTER — Ambulatory Visit (INDEPENDENT_AMBULATORY_CARE_PROVIDER_SITE_OTHER)
Admission: RE | Admit: 2023-05-03 | Discharge: 2023-05-03 | Disposition: A | Discharge status: Home / Self Care | Payer: Medicare Other | Source: Ambulatory Visit | Attending: Family Medicine | Admitting: Family Medicine

## 2023-05-03 DIAGNOSIS — M810 Age-related osteoporosis without current pathological fracture: Secondary | ICD-10-CM | POA: Diagnosis not present

## 2023-06-05 DIAGNOSIS — S72031D Displaced midcervical fracture of right femur, subsequent encounter for closed fracture with routine healing: Secondary | ICD-10-CM | POA: Diagnosis not present

## 2023-07-19 DIAGNOSIS — M25561 Pain in right knee: Secondary | ICD-10-CM | POA: Insufficient documentation

## 2023-07-19 DIAGNOSIS — M170 Bilateral primary osteoarthritis of knee: Secondary | ICD-10-CM | POA: Diagnosis not present

## 2023-09-11 DIAGNOSIS — M1711 Unilateral primary osteoarthritis, right knee: Secondary | ICD-10-CM | POA: Diagnosis not present

## 2023-10-11 DIAGNOSIS — D3132 Benign neoplasm of left choroid: Secondary | ICD-10-CM | POA: Diagnosis not present

## 2023-12-05 DIAGNOSIS — H905 Unspecified sensorineural hearing loss: Secondary | ICD-10-CM | POA: Diagnosis not present

## 2024-01-29 ENCOUNTER — Encounter: Payer: Self-pay | Admitting: Dermatology

## 2024-01-29 ENCOUNTER — Ambulatory Visit: Payer: Medicare Other | Admitting: Dermatology

## 2024-01-29 VITALS — BP 130/82 | HR 77

## 2024-01-29 DIAGNOSIS — L570 Actinic keratosis: Secondary | ICD-10-CM | POA: Diagnosis not present

## 2024-01-29 DIAGNOSIS — D1801 Hemangioma of skin and subcutaneous tissue: Secondary | ICD-10-CM | POA: Diagnosis not present

## 2024-01-29 DIAGNOSIS — L814 Other melanin hyperpigmentation: Secondary | ICD-10-CM

## 2024-01-29 DIAGNOSIS — Z1283 Encounter for screening for malignant neoplasm of skin: Secondary | ICD-10-CM | POA: Diagnosis not present

## 2024-01-29 DIAGNOSIS — W908XXA Exposure to other nonionizing radiation, initial encounter: Secondary | ICD-10-CM

## 2024-01-29 DIAGNOSIS — L578 Other skin changes due to chronic exposure to nonionizing radiation: Secondary | ICD-10-CM

## 2024-01-29 DIAGNOSIS — D229 Melanocytic nevi, unspecified: Secondary | ICD-10-CM

## 2024-01-29 NOTE — Progress Notes (Signed)
 Total Body Skin Exam (TBSE) Visit   Subjective  Abigail Shah is a 83 y.o. female who presents for the following: Skin Cancer Screening and Full Body Skin Exam  Patient presents today for follow up visit for TBSE. Patient was last evaluated on 02/01/24 . Patient denies medication changes. Patient reports she does not have spots, moles and lesions of concern to be evaluated. Patient reports throughout her lifetime she has had minimal sun exposure. Currently, patient reports if she has excessive sun exposure, she does not apply sunscreen and/or wears protective coverings. Patient reports she does not have hx of bx (Hx of Skin Cancer unsure of type). Patient denies  family history of skin cancers. The patient has spots, moles and lesions to be evaluated, some may be new or changing and the patient has concerns that these could be cancer.  The following portions of the chart were reviewed this encounter and updated as appropriate: medications, allergies, medical history  Review of Systems:  No other skin or systemic complaints except as noted in HPI or Assessment and Plan.  Objective  Well appearing patient in no apparent distress; mood and affect are within normal limits.  A full examination was performed including scalp, head, eyes, ears, nose, lips, neck, chest, axillae, abdomen, back, buttocks, bilateral upper extremities, bilateral lower extremities, hands, feet, fingers, toes, fingernails, and toenails. All findings within normal limits unless otherwise noted below.   Relevant physical exam findings are noted in the Assessment and Plan.  Left Upper Back, Right Thigh - Anterior Erythematous thin papules/macules with gritty scale.   Assessment & Plan   LENTIGINES, SEBORRHEIC KERATOSES, HEMANGIOMAS - Benign normal skin lesions - Benign-appearing - Call for any changes  MELANOCYTIC NEVI - Tan-brown and/or pink-flesh-colored symmetric macules and papules - Benign appearing on exam  today - Observation - Call clinic for new or changing moles - Recommend daily use of broad spectrum spf 30+ sunscreen to sun-exposed areas.   ACTINIC DAMAGE - Chronic condition, secondary to cumulative UV/sun exposure - diffuse scaly erythematous macules with underlying dyspigmentation - Recommend daily broad spectrum sunscreen SPF 30+ to sun-exposed areas, reapply every 2 hours as needed.  - Staying in the shade or wearing long sleeves, sun glasses (UVA+UVB protection) and wide brim hats (4-inch brim around the entire circumference of the hat) are also recommended for sun protection.  - Call for new or changing lesions.  ACTINIC KERATOSIS Exam: Erythematous thin papules/macules with gritty scale at the Left upper back and right outer thigh  Actinic keratoses are precancerous spots that appear secondary to cumulative UV radiation exposure/sun exposure over time. They are chronic with expected duration over 1 year. A portion of actinic keratoses will progress to squamous cell carcinoma of the skin. It is not possible to reliably predict which spots will progress to skin cancer and so treatment is recommended to prevent development of skin cancer.  Recommend daily broad spectrum sunscreen SPF 30+ to sun-exposed areas, reapply every 2 hours as needed.  Recommend staying in the shade or wearing long sleeves, sun glasses (UVA+UVB protection) and wide brim hats (4-inch brim around the entire circumference of the hat). Call for new or changing lesions.  Treatment Plan: -Cryo Completed while in office today  SKIN CANCER SCREENING PERFORMED TODAY.  AK (ACTINIC KERATOSIS) (2) Left Upper Back, Right Thigh - Anterior Destruction of lesion - Left Upper Back Complexity: simple   Destruction method: cryotherapy   Informed consent: discussed and consent obtained   Timeout:  patient name, date of birth, surgical site, and procedure verified Lesion destroyed using liquid nitrogen: Yes   Region frozen  until ice ball extended beyond lesion: Yes   Cryotherapy cycles:  2 Outcome: patient tolerated procedure well with no complications   Post-procedure details: wound care instructions given    SKIN EXAM FOR MALIGNANT NEOPLASM   MULTIPLE BENIGN MELANOCYTIC NEVI   CHERRY ANGIOMA   ACTINIC SKIN DAMAGE   LENTIGINES    Return in about 1 year (around 01/28/2025) for TBSE.  I, Jetta Ager, am acting as Neurosurgeon for [employer], DO.  Documentation: I have reviewed the above documentation for accuracy and completeness, and I agree with the above.  Delon Lenis, DO

## 2024-01-29 NOTE — Patient Instructions (Addendum)

## 2024-02-08 ENCOUNTER — Ambulatory Visit: Payer: Self-pay

## 2024-02-08 ENCOUNTER — Telehealth: Payer: Self-pay | Admitting: Family Medicine

## 2024-02-08 ENCOUNTER — Ambulatory Visit: Admitting: Physician Assistant

## 2024-02-08 VITALS — BP 130/68 | HR 75 | Temp 97.6°F | Wt 160.6 lb

## 2024-02-08 DIAGNOSIS — G629 Polyneuropathy, unspecified: Secondary | ICD-10-CM | POA: Diagnosis not present

## 2024-02-08 NOTE — Telephone Encounter (Signed)
 FYI Only or Action Required?: Action required by provider: request for appointment.  Patient was last seen in primary care on 04/27/2023 by Katrinka Garnette KIDD, MD.  Called Nurse Triage reporting Numbness.  Symptoms began several weeks ago.  Interventions attempted: Nothing.  Symptoms are: stable. Tingling in both feet for a year, but constant now.  Triage Disposition: See PCP When Office is Open (Within 3 Days)  Patient/caregiver understands and will follow disposition?:     Copied from CRM [id number]. Topic: Clinical - Red Word Triage >> Feb 08, 2024  9:06 AM Pinkey ORN wrote: Red Word that prompted transfer to Nurse Triage: Numbness / Tingling >> Feb 08, 2024  9:08 AM [facility] ORN wrote: Patient states she's developing neuropathy in her feet, patient is experiencing some numbness and tingling.  Reason for Disposition  [1] Numbness or tingling in one or both feet AND [2] is a chronic symptom (recurrent or ongoing AND present > 4 weeks)  Answer Assessment - Initial Assessment Questions 1. SYMPTOM: What is the main symptom you are concerned about? (e.g., weakness, numbness)     Tingling in feet 2. ONSET: When did this start? (e.g., minutes, hours, days; while sleeping)     Last year 3. LAST NORMAL: When was the last time you (the patient) were normal (no symptoms)?     Last year 4. PATTERN Does this come and go, or has it been constant since it started?  Is it present now?     constant 5. CARDIAC SYMPTOMS: Have you had any of the following symptoms: chest pain, difficulty breathing, palpitations?     no 6. NEUROLOGIC SYMPTOMS: Have you had any of the following symptoms: headache, dizziness, vision loss, double vision, changes in speech, unsteady on your feet?     no 7. OTHER SYMPTOMS: Do you have any other symptoms?     Poor balance 8. PREGNANCY: Is there any chance you are pregnant? When was your last menstrual period?     no  Protocols used: Neurologic  Deficit-A-AH

## 2024-02-08 NOTE — Progress Notes (Signed)
 1

## 2024-02-08 NOTE — Telephone Encounter (Signed)
 Pt is requesting labs prior to CPE. Please advise

## 2024-02-08 NOTE — Progress Notes (Signed)
 Patient ID: Abigail Shah Minor, female    DOB: 03-16-1941, 83 y.o.   MRN: [id number]   Assessment & Plan:  Neuropathy     Assessment & Plan Peripheral neuropathy, left foot worse than right Chronic peripheral neuropathy with numbness and tingling in the feet, left foot worse than right. Symptoms include feeling of walking on bubbles and balance issues. No involvement of hands or fingers. Differential diagnosis includes diabetes, B12 deficiency, or other causes. Thalassemia minor and previous surgery with pressure socks possibly contributing to symptoms. No signs of vascular insufficiency as pulses are good and no cold feet. Symptoms have worsened over the last few months. Discussed potential side effects of neuropathy medications, including drowsiness and dizziness, if considered in the future. - Offered to order labs today, she wants to wait until she sees her PCP. - Encourage continuation of physical activity including chair yoga and water  aerobics. - Advise elevating legs at night to reduce symptoms. - Offered medication trial such as gabapentin, but she didn't want to go this route today and will wait for her PCP appt.    F/up prn    Subjective:    Chief Complaint  Patient presents with   Tingling    In both feet, started after hip replacement last year. Now toes are sort of numbed and it started affecting her balance.     HPI Discussed the use of AI scribe software for clinical note transcription with the patient, who gave verbal consent to proceed.  History of Present Illness Abigail Shah is an 83 year old female who presents with numbness and tingling in her feet.  She experiences numbness and a tingling sensation in her feet, describing it as feeling like she is 'walking on bubbles.' Her toes are numb, and her balance is affected, causing her to shuffle her feet to navigate obstacles. The patient reports that the left foot and ankle feel worse than the right, and  she perceives more swelling in the toes of the left foot. These symptoms have persisted for several months, with a noted worsening in recent months. The numbness and tingling are primarily located up to her ankles, with her calves and shins feeling normal. No numbness or tingling in hands or fingers.  She says she had a broken arm and hip last year, after which she underwent surgery. During the surgery, she was given extra small pressure socks, which she believes caused her current foot issues as they felt extremely tight and painful, leading to ongoing problems.  Her current physical activities include water  aerobics three mornings a week, chair yoga, and using a machine for her arms and feet, covering a mile every day. She is trying to stay active to maintain her balance and mobility. She was an Art therapist for seventy years and currently resides at [facility], where she participates in various physical activities to maintain her health.  She takes several supplements, including Biflex for her knee, which contains turmeric, magnesium, and 5000 mg of B12. She has a history of thalassemia minor and received two units of blood during her hip replacement surgery.She denies any history of diabetes or high blood sugar and has not been told she is diabetic. She has regular annual appointments for blood work, with the next one scheduled for November.     Past Medical History:  Diagnosis Date   Anemia    Atypical nevus 05/21/2003   Right Fifth Toe-Slight   Atypical nevus 11/27/2012   Right Hip-Moderate  Atypical nevus 12/27/2016   Left Neck-Moderate(widershave)   Atypical nevus x 2 10/21/2001   Left Lower Back-Slight to Moderate and Right Post Upper Arm- Slight   BCC (basal cell carcinoma of skin) 10/21/2001   Left Side Scalp   BCC (basal cell carcinoma of skin) 11/23/2009   Left Forehead(curet and cautery)   BCC (basal cell carcinoma of skin) 08/01/2011   Left Upper Lip(curet)   Hyperlipidemia     Osteopenia    Skin cancer    hx of scalp   Thalassemia     Past Surgical History:  Procedure Laterality Date   APPENDECTOMY     BUNIONECTOMY     complicated with fractured toe x 2 feet   CATARACT EXTRACTION     bilateral   CESAREAN SECTION     x2   COLONOSCOPY  2003   DILATION AND CURETTAGE OF UTERUS     miscarriage     x2   skin cancer scalp     TONSILLECTOMY AND ADENOIDECTOMY     TOTAL HIP ARTHROPLASTY Right 11/25/2022   Procedure: TOTAL HIP ARTHROPLASTY ANTERIOR APPROACH;  Surgeon: Fidel Rogue, MD;  Location: [facility];  Service: Orthopedics;  Laterality: Right;   TUBAL LIGATION     vertigo      Family History  Problem Relation Age of Onset   Dementia Mother    Hyperlipidemia Father    Heart attack Father 49   Multiple sclerosis Brother        actually radiation poisoning   Hyperlipidemia Other    Stroke Other    Breast cancer Neg Hx     Social History   Tobacco Use   Smoking status: Never    Passive exposure: Never   Smokeless tobacco: Never  Vaping Use   Vaping status: Never Used  Substance Use Topics   Alcohol  use: No   Drug use: No     No Known Allergies  Review of Systems NEGATIVE UNLESS OTHERWISE INDICATED IN HPI      Objective:     BP 130/68   Pulse 75   Temp 97.6 F (36.4 C)   Wt 160 lb 9.6 oz (72.8 kg)   LMP  (LMP Unknown)   SpO2 98%   BMI 28.45 kg/m   Wt Readings from Last 3 Encounters:  02/08/24 160 lb 9.6 oz (72.8 kg)  04/27/23 158 lb 9.6 oz (71.9 kg)  11/24/22 153 lb (69.4 kg)    BP Readings from Last 3 Encounters:  02/08/24 130/68  01/29/24 130/82  04/27/23 138/64     Physical Exam Vitals and nursing note reviewed.  Constitutional:      Appearance: Normal appearance.  Cardiovascular:     Rate and Rhythm: Normal rate.     Pulses:          Dorsalis pedis pulses are 2+ on the right side and 2+ on the left side.       Posterior tibial pulses are 2+ on the right side and 2+ on the left side.  Pulmonary:      Effort: Pulmonary effort is normal.  Skin:    Findings: No rash.  Neurological:     Mental Status: She is alert and oriented to person, place, and time.     Sensory: Sensory deficit (bilateral feet per monofilament testing) present.     Motor: No weakness.     Gait: Gait normal.  Psychiatric:        Mood and Affect: Mood normal.  Behavior: Behavior normal.             Artisha Capri M Jeriah Corkum, PA-C

## 2024-02-08 NOTE — Patient Instructions (Signed)
 Please refer to handout attached about neuropathy (nerve pain).  Let us  know sooner if you would like work-up done or to consider medication treatments.                            Contains text generated by Abridge.

## 2024-02-11 ENCOUNTER — Other Ambulatory Visit: Payer: Self-pay | Admitting: Family Medicine

## 2024-02-11 DIAGNOSIS — E785 Hyperlipidemia, unspecified: Secondary | ICD-10-CM

## 2024-02-11 DIAGNOSIS — E559 Vitamin D deficiency, unspecified: Secondary | ICD-10-CM

## 2024-02-11 NOTE — Telephone Encounter (Signed)
 Order placed. Patient called and scheduled for labs prior to CPE

## 2024-03-11 ENCOUNTER — Telehealth: Payer: Self-pay

## 2024-03-11 NOTE — Telephone Encounter (Signed)
 Copied from CRM [id number]. Topic: Clinical - Medical Advice >> Mar 10, 2024  9:41 AM Abigail Shah wrote: Reason for CRM: Patient is calling in stating that on Tuesday from about 2-6 Pm, she couldn't form sentences or find her words, and was not making a lot of sense. Patient has not had any issues since. Patient is asking she should be seen or wait until her physical. Please advise patient.

## 2024-03-11 NOTE — Telephone Encounter (Signed)
 No I would recommend she be evaluated as soon as possible and if recurrence seek care in emergency department

## 2024-04-24 ENCOUNTER — Ambulatory Visit: Payer: Self-pay | Admitting: Family Medicine

## 2024-04-24 ENCOUNTER — Other Ambulatory Visit (INDEPENDENT_AMBULATORY_CARE_PROVIDER_SITE_OTHER)

## 2024-04-24 DIAGNOSIS — E785 Hyperlipidemia, unspecified: Secondary | ICD-10-CM

## 2024-04-24 DIAGNOSIS — E559 Vitamin D deficiency, unspecified: Secondary | ICD-10-CM

## 2024-04-24 LAB — CBC WITH DIFFERENTIAL/PLATELET
Basophils Absolute: 0 K/uL (ref 0.0–0.1)
Basophils Relative: 0.9 % (ref 0.0–3.0)
Eosinophils Absolute: 0.1 K/uL (ref 0.0–0.7)
Eosinophils Relative: 2.6 % (ref 0.0–5.0)
HCT: 34.1 % — ABNORMAL LOW (ref 36.0–46.0)
Hemoglobin: 11 g/dL — ABNORMAL LOW (ref 12.0–15.0)
Lymphocytes Relative: 47.4 % — ABNORMAL HIGH (ref 12.0–46.0)
Lymphs Abs: 2.1 K/uL (ref 0.7–4.0)
MCHC: 32.1 g/dL (ref 30.0–36.0)
MCV: 66 fl — ABNORMAL LOW (ref 78.0–100.0)
Monocytes Absolute: 0.4 K/uL (ref 0.1–1.0)
Monocytes Relative: 8.9 % (ref 3.0–12.0)
Neutro Abs: 1.8 K/uL (ref 1.4–7.7)
Neutrophils Relative %: 40.2 % — ABNORMAL LOW (ref 43.0–77.0)
Platelets: 224 K/uL (ref 150.0–400.0)
RBC: 5.17 Mil/uL — ABNORMAL HIGH (ref 3.87–5.11)
RDW: 15.6 % — ABNORMAL HIGH (ref 11.5–15.5)
WBC: 4.5 K/uL (ref 4.0–10.5)

## 2024-04-24 LAB — COMPREHENSIVE METABOLIC PANEL WITH GFR
ALT: 14 U/L (ref 0–35)
AST: 22 U/L (ref 0–37)
Albumin: 4.3 g/dL (ref 3.5–5.2)
Alkaline Phosphatase: 58 U/L (ref 39–117)
BUN: 19 mg/dL (ref 6–23)
CO2: 26 meq/L (ref 19–32)
Calcium: 9.1 mg/dL (ref 8.4–10.5)
Chloride: 104 meq/L (ref 96–112)
Creatinine, Ser: 0.8 mg/dL (ref 0.40–1.20)
GFR: 67.02 mL/min (ref >60.00)
Glucose, Bld: 94 mg/dL (ref 70–99)
Potassium: 4.1 meq/L (ref 3.5–5.1)
Sodium: 139 meq/L (ref 135–145)
Total Bilirubin: 0.7 mg/dL (ref 0.2–1.2)
Total Protein: 6.6 g/dL (ref 6.0–8.3)

## 2024-04-28 ENCOUNTER — Encounter: Payer: Self-pay | Admitting: Family Medicine

## 2024-04-28 ENCOUNTER — Telehealth: Payer: Self-pay | Admitting: Family Medicine

## 2024-04-28 ENCOUNTER — Ambulatory Visit: Payer: Medicare Other | Admitting: Family Medicine

## 2024-04-28 VITALS — BP 118/60 | HR 84 | Temp 97.6°F | Ht 63.0 in | Wt 161.2 lb

## 2024-04-28 DIAGNOSIS — M810 Age-related osteoporosis without current pathological fracture: Secondary | ICD-10-CM | POA: Diagnosis not present

## 2024-04-28 DIAGNOSIS — E785 Hyperlipidemia, unspecified: Secondary | ICD-10-CM

## 2024-04-28 DIAGNOSIS — E559 Vitamin D deficiency, unspecified: Secondary | ICD-10-CM | POA: Diagnosis not present

## 2024-04-28 DIAGNOSIS — Z0000 Encounter for general adult medical examination without abnormal findings: Secondary | ICD-10-CM

## 2024-04-28 MED ORDER — ATORVASTATIN CALCIUM 10 MG PO TABS: 10.0000 mg | ORAL_TABLET | Freq: Every day | ORAL | 3 refills | Status: AC

## 2024-04-28 NOTE — Patient Instructions (Addendum)
 2 Labs still pending  -likely no stroke based on symptoms resolving within 4 hour- could still be TIA and we could do full workup as below - we discussed doing full workup including EKG, cardiac monitoring, echocardiogram, carotid duplex, CT, MRI and considering advancing aspirin  to Plavix vs anticoagulant if any evidence of a fib - she wants to think this over first- declines today - she will call me if wants to move forward -if recurrence go straight to emergency department  Recommended follow up: Return in about 1 year (around 04/28/2025) for physical or sooner if needed.Schedule b4 you leave.

## 2024-04-28 NOTE — Progress Notes (Signed)
 Phone [phone]   Subjective:  Patient presents today for their annual physical. Chief complaint-noted.   See problem oriented charting- ROS- full  review of systems was completed and negative except for topics noted under acute/chronic concerns   The following were reviewed and entered/updated in epic: Past Medical History:  Diagnosis Date   Anemia    Atypical nevus 05/21/2003   Right Fifth Toe-Slight   Atypical nevus 11/27/2012   Right Hip-Moderate   Atypical nevus 12/27/2016   Left Neck-Moderate(widershave)   Atypical nevus x 2 10/21/2001   Left Lower Back-Slight to Moderate and Right Post Upper Arm- Slight   BCC (basal cell carcinoma of skin) 10/21/2001   Left Side Scalp   BCC (basal cell carcinoma of skin) 11/23/2009   Left Forehead(curet and cautery)   BCC (basal cell carcinoma of skin) 08/01/2011   Left Upper Lip(curet)   Hyperlipidemia    Osteopenia    Skin cancer    hx of scalp   Thalassemia    Patient Active Problem List   Diagnosis Date Noted   Osteoporosis 08/12/2007    Priority: Medium    Hyperlipidemia 07/04/2007    Priority: Medium    THALASSEMIA NEC 02/15/2007    Priority: Medium    Postmenopausal bleeding 10/23/2014    Priority: Low   Leukopenia 05/20/2014    Priority: Low   SKIN CANCER, HX OF 07/03/2007    Priority: Low   Pain in joint of right knee 07/19/2023   Vitamin D  deficiency 04/27/2023   Osteoarthritis of right knee 02/02/2023   Closed fracture of proximal end of right humerus 11/28/2022   Acute postoperative anemia due to expected blood loss 11/26/2022   Closed right hip fracture, initial encounter (HCC) 11/24/2022   Hyponatremia 11/24/2022   Vomiting 11/24/2022   Right knee pain 11/21/2022   Pain in joint of left shoulder 10/28/2022   BPPV (benign paroxysmal positional vertigo), unspecified laterality 08/05/2021   Bilateral hearing loss 08/05/2021   Hammer toe 08/02/2020   Acquired hallux varus 08/02/2020   Pain in left  knee 08/06/2018   Past Surgical History:  Procedure Laterality Date   APPENDECTOMY     BUNIONECTOMY     complicated with fractured toe x 2 feet   CATARACT EXTRACTION     bilateral   CESAREAN SECTION     x2   COLONOSCOPY  2003   DILATION AND CURETTAGE OF UTERUS     miscarriage     x2   skin cancer scalp     TONSILLECTOMY AND ADENOIDECTOMY     TOTAL HIP ARTHROPLASTY Right 11/25/2022   Procedure: TOTAL HIP ARTHROPLASTY ANTERIOR APPROACH;  Surgeon: Fidel Rogue, MD;  Location: [facility];  Service: Orthopedics;  Laterality: Right;   TUBAL LIGATION     vertigo      Family History  Problem Relation Age of Onset   Dementia Mother    Hyperlipidemia Father    Heart attack Father 46   Multiple sclerosis Brother        actually radiation poisoning   Hyperlipidemia Other    Stroke Other    Breast cancer Neg Hx     Medications- reviewed and updated Current Outpatient Medications  Medication Sig Dispense Refill   acetaminophen  (TYLENOL ) 650 MG CR tablet Take 650 mg by mouth every 6 (six) hours as needed for pain.     aspirin  EC 81 MG tablet Take 81 mg by mouth daily. Swallow whole.     atorvastatin  (LIPITOR) 10 MG tablet  Take 1 tablet (10 mg total) by mouth daily. 90 tablet 3   bisacodyl (DULCOLAX) 10 MG suppository Place 10 mg rectally as needed for moderate constipation.     Calcium  Carbonate-Vitamin D  500-3.125 MG-MCG TABS Take by mouth.     cholecalciferol (VITAMIN D3) 25 MCG (1000 UNIT) tablet Take 5,000 Units by mouth daily.     Cyanocobalamin  (B-12) 1000 MCG SUBL Place under the tongue.     docusate sodium  (COLACE) 100 MG capsule Take 1 capsule (100 mg total) by mouth 2 (two) times daily. 10 capsule 0   ondansetron  (ZOFRAN ) 4 MG tablet Take 4 mg by mouth every 6 (six) hours as needed for nausea or vomiting.     No current facility-administered medications for this visit.    Allergies-reviewed and updated No Known Allergies  Social History   Social History Narrative    Lives alone. Widowed 1987. 2 children. 4 grandkids. No greatgrandkids.    Lived in [place] since going to [employer] college (livesd away for a few years)      Retired from physiological scientist. Still doing a lot.       Hobbies: a lot of travel because son lives overseas mostly, daughter in [place] .    Same church since 1962.       Advanced directives. HCPOA- both children. DNR   Objective  Objective:  BP 118/60 (BP Location: Left Arm, Patient Position: Sitting, Cuff Size: Normal)   Pulse 84   Temp 97.6 F (36.4 C) (Temporal)   Ht 5' 3 (1.6 m)   Wt 161 lb 3.2 oz (73.1 kg)   LMP  (LMP Unknown)   SpO2 95%   BMI 28.56 kg/m  Gen: NAD, resting comfortably HEENT: Mucous membranes are moist. Oropharynx normal Neck: no thyromegaly CV: RRR no murmurs rubs or gallops Lungs: CTAB no crackles, wheeze, rhonchi Abdomen: soft/nontender/nondistended/normal bowel sounds. No rebound or guarding.  Ext: no edema Skin: warm, dry Neuro: CN II-XII intact, sensation and reflexes normal throughout, 5/5 muscle strength in bilateral upper and lower extremities. Normal finger to nose. Normal rapid alternating movements. No pronator drift. Normal romberg. Normal gait.    Assessment and Plan   83 y.o. female presenting for annual physical.  Health Maintenance counseling: 1. Anticipatory guidance: Patient counseled regarding regular dental exams -q6 months, eye exams - yearly,  avoiding smoking and second hand smoke , limiting alcohol  to 1 beverage per day- sparing glass of wine on friday , no illicit drugs .   2. Risk factor reduction:  Advised patient of need for regular exercise and diet rich and fruits and vegetables to reduce risk of heart attack and stroke.  Exercise-exercise 4-5 days a week at [employer] plus walking Diet/weight management-up 3 pounds from last year but still rather healthy weight for her age-per BMI mildly overweight.  Wt Readings from Last 3 Encounters:  04/28/24 161 lb 3.2  oz (73.1 kg)  02/08/24 160 lb 9.6 oz (72.8 kg)  04/27/23 158 lb 9.6 oz (71.9 kg)  3. Immunizations/screenings/ancillary studies-plans Tdap at pharmacy, holding off on COVID vaccination- may do later.  Consider Shingrix at pharmacy but already had Zostavax at least- putting it off  Immunization History  Administered Date(s) Administered   DTaP 01/20/2011   Fluad Quad(high Dose 65+) 04/16/2020, 04/17/2024   Fluad Trivalent(High Dose 65+) 04/06/2023   Hepatitis A 04/27/2006, 11/01/2006   Hepatitis B 04/27/2006, 05/28/2006, 11/01/2006   INFLUENZA, HIGH DOSE SEASONAL PF 02/20/2018, 02/26/2019, 03/24/2022   Influenza Split 03/27/2011,  04/03/2012   Influenza Whole 03/19/2006, 03/17/2009   Influenza,inj,Quad PF,6+ Mos 04/14/2013   Influenza-Unspecified 04/02/2015, 04/17/2017, 04/16/2021   Meningococcal polysaccharide vaccine (MPSV4) 04/12/2006   PFIZER Comirnaty (Gray Top)Covid-19 Tri-Sucrose Vaccine 01/05/2021, 03/24/2022   PFIZER(Purple Top)SARS-COV-2 Vaccination 07/06/2019, 07/26/2019, 03/26/2020   PPD Test 05/06/2012   Pneumococcal Conjugate-13 05/20/2014   Pneumococcal Polysaccharide-23 03/19/2004   Td 03/19/2006   Zoster, Live 04/12/2006  4. Cervical cancer screening- past age based screening recommendations.  No vaginal bleeding or discharge. 5. Breast cancer screening-she discontinued screening in 2020-passage of a screening recommendations 6. Colon cancer screening -colonoscopy in 2003-passed age-based screening recommendations.  Normal stool cards 2015 and 2017.  We have discontinued screening.  No blood in the stool or melena 7. Skin cancer screening-Works with Dr. Alm Most recently. advised regular sunscreen use. Denies worrisome, changing, or new skin lesions.  8. Birth control/STD check-not sexually active as widow 58. Osteoporosis screening at 65- see below 10. Smoking associated screening - never smoker  Status of chronic or acute concerns   # Concern for TIA-patient had a  period for about 4 hours 3 weeks ago where she cannot speak clearly-  - we discussed doing full workup including EKG, cardiac monitoring, echocardiogram, carotid duplex, CT, MRI and considering advancing aspirin  to Plavix vs anticoagulant if any evidence of a fib - she wants to think this over first- declines today - she will call me if wants to move forward  #hyperlipidemia S: Medication: atorvastatin  10 mg  Lab Results  Component Value Date   CHOL 212 (H) 04/27/2023   HDL 61.70 04/27/2023   LDLCALC 127 (H) 04/27/2023   LDLDIRECT 134.0 04/25/2022   TRIG 115.0 04/27/2023   CHOLHDL 3 04/27/2023   A/P: Lipid panel is pending-our lab is behind on processing due to machine error-hoping for LDL under 70 especially with her recent symptoms-we discussed increasing dose if not at goal   # Osteoporosis S: Last DEXA: 05/03/23- lumbar spine improved. Left hip slightly worse. 2 year repeat planned  Medication (bisphosphonate or prolia): Fosamax  May 2016 through 2021- could consider repeat course if worsens again - walking regularly and exercise at [place]  Calcium : 1200mg  (through diet ok) recommended -taking Vitamin D : 1000 units a day recommended-  taking A/P: recheck next year- hoping stable- continue current medications    #Vitamin D  deficiency S: Medication: 5000 units Last vitamin D  Lab Results  Component Value Date   VD25OH 31.33 04/27/2023  A/P: hopefully stable- update vitamin D  today. Continue current meds for now    #Drooling-long-term issue worsened with Invisalign previously and then retainer  #Thalassemia-mild anemia-we will monitor -Has had leukopenia in the past but normal last year and this year Lab Results  Component Value Date   WBC 4.5 04/24/2024   HGB 11.0 (L) 04/24/2024   HCT 34.1 (L) 04/24/2024   MCV 66.0 Repeated and verified X2. (L) 04/24/2024   PLT 224.0 04/24/2024   #Dupuytren contracture on right hand-has seen Dr. Joane and Dr. Camella I believe in the  past.  No surgery recommended-monitor for now- not bothering her   #peripheral neuropathy- left worse than right- some balance issues with this. Good pulses at last visit with Alyssa Allwardt, PA. She wanted to hold off on medications. No obvious diabetes on labs- B12 was not checked nor TSH on last labs - consider these in the future. Reports after compression stockings in hospital it really started bothering her in past  Recommended follow up: Return in about 1 year (around 04/28/2025)  for physical or sooner if needed.Schedule b4 you leave. Future Appointments  Date Time Provider Department Center  01/29/2025 10:15 AM Alm Delon SAILOR, DO [facility] None   Lab/Order associations: already did labs   ICD-10-CM   1. Preventative health care  Z00.00     2. Hyperlipidemia, unspecified hyperlipidemia type  E78.5     3. Vitamin D  deficiency  E55.9     4. Age-related osteoporosis without current pathological fracture  M81.0       No orders of the defined types were placed in this encounter.   Return precautions advised.  Garnette Lukes, MD

## 2024-04-28 NOTE — Telephone Encounter (Signed)
 Pt has a scheduled physical for 04/29/25 and wants to get fasting labs done 1 week prior to the appt. Please advise.

## 2024-04-28 NOTE — Telephone Encounter (Signed)
 Pt is scheduled for physical on 04/29/25 and wants to complete fasting 1 week prior to the appt. Please advise.

## 2024-04-30 ENCOUNTER — Telehealth: Payer: Self-pay | Admitting: Family Medicine

## 2024-04-30 ENCOUNTER — Ambulatory Visit: Attending: Family Medicine

## 2024-04-30 ENCOUNTER — Other Ambulatory Visit: Payer: Self-pay | Admitting: Family Medicine

## 2024-04-30 DIAGNOSIS — R4701 Aphasia: Secondary | ICD-10-CM

## 2024-04-30 DIAGNOSIS — E559 Vitamin D deficiency, unspecified: Secondary | ICD-10-CM

## 2024-04-30 DIAGNOSIS — G459 Transient cerebral ischemic attack, unspecified: Secondary | ICD-10-CM

## 2024-04-30 NOTE — Telephone Encounter (Signed)
 Once we get CT back will need to order MRI

## 2024-04-30 NOTE — Progress Notes (Unsigned)
 EP to read.

## 2024-04-30 NOTE — Telephone Encounter (Signed)
 I ordered other test- please have her follow up if hasn't heard about all 4 within a week

## 2024-04-30 NOTE — Addendum Note (Signed)
 Addended by: KATRINKA GARNETTE KIDD on: 04/30/2024 03:22 PM   Modules accepted: Orders

## 2024-04-30 NOTE — Telephone Encounter (Signed)
 Please see patient request and advise.    Copied from CRM [id number]. Topic: General - Other >> Apr 30, 2024  2:35 PM Eva FALCON wrote: Reason for CRM: Pt states she is calling back to let Dr. Katrinka know she would like to proceed with the testing that was discussed at her physical on Monday. I do see his notes he mentioned # Concern for TIA-patient had a period for about 4 hours 3 weeks ago where she cannot speak clearly-  - we discussed doing full workup including EKG, cardiac monitoring, echocardiogram, carotid duplex, CT, MRI and considering advancing aspirin  to Plavix vs anticoagulant if any evidence of a fib - she wants to think this over first- declines today - she will call me if wants to move forward she did say she would like these done after Thanksgiving, and is requesting a  call back for what to do next.

## 2024-05-06 ENCOUNTER — Telehealth: Payer: Self-pay | Admitting: Family Medicine

## 2024-05-06 NOTE — Telephone Encounter (Signed)
 Noted. Will advise Dr. Katrinka.   Copied from CRM [id number]. Topic: General - Other >> May 06, 2024 10:02 AM Franky GRADE wrote: Reason for CRM: Patient is calling to advise that she received the heart monitor on Monday; however, she is waiting for her son to arrive on Saturday to assist with putting it on. She apologizes for any delays this may cause but she has no other assistance at home.

## 2024-05-07 ENCOUNTER — Telehealth: Payer: Self-pay

## 2024-05-07 NOTE — Telephone Encounter (Signed)
-----   Message from Garnette Lukes sent at 05/07/2024  2:17 PM EST ----- Team can you update me on the status of this? ----- Message ----- From: SYSTEM Sent: 04/29/2024   1:12 AM EST To: Garnette MALVA Lukes, MD

## 2024-05-08 ENCOUNTER — Telehealth: Payer: Self-pay | Admitting: Family Medicine

## 2024-05-08 NOTE — Telephone Encounter (Signed)
 LEFT PATIENT VOICEMAIL 05/08/24 TO SCHE HER LABS PRIOR TO HER CPE FOR NEXT YEAR.

## 2024-05-20 ENCOUNTER — Ambulatory Visit
Admission: RE | Admit: 2024-05-20 | Discharge: 2024-05-20 | Disposition: A | Source: Ambulatory Visit | Attending: Family Medicine | Admitting: Family Medicine

## 2024-05-20 DIAGNOSIS — R4701 Aphasia: Secondary | ICD-10-CM

## 2024-05-20 DIAGNOSIS — G459 Transient cerebral ischemic attack, unspecified: Secondary | ICD-10-CM

## 2024-05-26 ENCOUNTER — Ambulatory Visit: Payer: Self-pay | Admitting: Family Medicine

## 2024-05-29 DIAGNOSIS — R4701 Aphasia: Secondary | ICD-10-CM

## 2024-05-29 DIAGNOSIS — G459 Transient cerebral ischemic attack, unspecified: Secondary | ICD-10-CM

## 2024-06-06 ENCOUNTER — Ambulatory Visit (HOSPITAL_COMMUNITY)
Admission: RE | Admit: 2024-06-06 | Discharge: 2024-06-06 | Disposition: A | Discharge status: Home / Self Care | Source: Ambulatory Visit | Attending: Family Medicine | Admitting: Family Medicine

## 2024-06-06 ENCOUNTER — Ambulatory Visit (HOSPITAL_COMMUNITY): Admission: RE | Admit: 2024-06-06

## 2024-06-06 DIAGNOSIS — G459 Transient cerebral ischemic attack, unspecified: Secondary | ICD-10-CM

## 2024-06-06 DIAGNOSIS — R4701 Aphasia: Secondary | ICD-10-CM | POA: Insufficient documentation

## 2024-06-06 LAB — ECHOCARDIOGRAM COMPLETE
AR max vel: 1.69 cm2
AV Area VTI: 1.56 cm2
AV Area mean vel: 1.64 cm2
AV Mean grad: 4 mmHg
AV Peak grad: 7 mmHg
Ao pk vel: 1.32 m/s
Area-P 1/2: 2.54 cm2
S' Lateral: 2.37 cm

## 2024-06-16 ENCOUNTER — Telehealth: Payer: Self-pay | Admitting: Family Medicine

## 2024-06-16 NOTE — Telephone Encounter (Signed)
 Copied from CRM #[id number]. Topic: Referral - Question >> Jun 16, 2024 12:42 PM Avram MATSU wrote: Reason for CRM: pt is wondering if she can get a referral to [facility], MD Venetia Potters. Please advise [phone] >> Jun 16, 2024  2:21 PM Joesph B wrote: Patient says he is not at [facility], she made a mistake. [facility]  [phone]

## 2024-06-20 ENCOUNTER — Other Ambulatory Visit: Payer: Self-pay | Admitting: Family Medicine

## 2024-06-20 ENCOUNTER — Telehealth: Payer: Self-pay | Admitting: Family Medicine

## 2024-06-20 DIAGNOSIS — G629 Polyneuropathy, unspecified: Secondary | ICD-10-CM

## 2024-06-20 NOTE — Telephone Encounter (Signed)
 Yes under aphasia- see my last physical note and workup provided on return phone call

## 2024-06-20 NOTE — Telephone Encounter (Signed)
 Referral sent

## 2024-06-20 NOTE — Telephone Encounter (Signed)
 Please advise if appointment needed  Copied from CRM #[id number]. Topic: Referral - Question >> Jun 16, 2024 12:42 PM Avram MATSU wrote: Reason for CRM: pt is wondering if she can get a referral to [facility], MD Venetia Potters. Please advise [phone] >> Jun 20, 2024 10:27 AM Robinson DEL wrote: Patient following up on message sent for referral to Neurology for Neuropathy in feet, states she was in to get feet checked with nurse previously.  [facility] [phone]  >> Jun 16, 2024  3:57 PM Tiffany B wrote: A user error has taken place: encounter opened in error, closed for administrative reasons.  >> Jun 16, 2024  2:21 PM Joesph B wrote: Patient says he is not at [facility], she made a mistake. [facility]  [phone]

## 2024-06-23 ENCOUNTER — Other Ambulatory Visit: Payer: Self-pay | Admitting: Family Medicine

## 2024-06-23 DIAGNOSIS — R4701 Aphasia: Secondary | ICD-10-CM

## 2024-06-23 NOTE — Telephone Encounter (Signed)
 Referral sent

## 2024-06-24 ENCOUNTER — Ambulatory Visit: Admitting: Podiatry

## 2024-06-26 ENCOUNTER — Other Ambulatory Visit: Payer: Self-pay | Admitting: Family Medicine

## 2024-06-26 DIAGNOSIS — R4701 Aphasia: Secondary | ICD-10-CM

## 2024-06-27 NOTE — Telephone Encounter (Signed)
 Patient states she does not want to see Neurology, she wants to see Podiatry for Neuropathy.   Patient previously stated she wanted to see Neurology. Okay to request patient make an appointment with us  so we can ensure correct patient care and correct referral?

## 2024-06-27 NOTE — Telephone Encounter (Signed)
 Yes with all the confusion reasonable to schedule a visit- and to be honest I'm not sure of the benefit for podiatry in particular- is there something she thinks they can help with in particular?

## 2024-06-27 NOTE — Telephone Encounter (Signed)
 Copied from CRM #[id number]. Topic: Referral - Question >> Jun 27, 2024  9:21 AM Thersia BROCKS wrote: Reason for CRM: Patient called in stated she wasnt able to schedule with the Podiatry because it was sent for Aphasia would like for this to be edited and resent to them as she still hasn't been able to schedule to see them

## 2024-06-30 NOTE — Telephone Encounter (Signed)
 LVM for Patient to schedule OV re: Referral  per Provider

## 2024-07-01 ENCOUNTER — Ambulatory Visit: Admitting: Podiatry

## 2024-07-01 DIAGNOSIS — L600 Ingrowing nail: Secondary | ICD-10-CM

## 2024-07-01 DIAGNOSIS — B351 Tinea unguium: Secondary | ICD-10-CM | POA: Diagnosis not present

## 2024-07-01 DIAGNOSIS — M79675 Pain in left toe(s): Secondary | ICD-10-CM | POA: Diagnosis not present

## 2024-07-01 DIAGNOSIS — M79674 Pain in right toe(s): Secondary | ICD-10-CM | POA: Diagnosis not present

## 2024-07-01 DIAGNOSIS — G629 Polyneuropathy, unspecified: Secondary | ICD-10-CM | POA: Diagnosis not present

## 2024-07-01 NOTE — Patient Instructions (Signed)

## 2024-07-01 NOTE — Progress Notes (Signed)
 Subjective:   Patient ID: Abigail Shah, female   DOB: 84 y.o.   MRN: [id number]   HPI Chief Complaint  Patient presents with   [facility]    Patient presents today for RFC Nail trim as well as B/l hallux toe nail patient reports it has been ongoing for about 1 month.   84 year old female presents the [place] above concerns.  She states that she recently had a pedicure and afterwards she got ingrown toenails and since then she is not have the nails trimmed and the new cause discomfort.  Nails mostly for 10 of the big toes but all the nails are elongated and cause discomfort.  No swelling, redness or any drainage.  No open lesions.  She also states that she is can spot neuropathy she feels a bubble sensations on the bottom of her feet.  She states that she is scheduled see her PCP tomorrow.  She was asked about a neurology referral but she is going to see her PCP.  She thinks this started after being at the hospital and wearing too tight of compression socks.   Review of Systems  All other systems reviewed and are negative.  Past Medical History:  Diagnosis Date   Anemia    Atypical nevus 05/21/2003   Right Fifth Toe-Slight   Atypical nevus 11/27/2012   Right Hip-Moderate   Atypical nevus 12/27/2016   Left Neck-Moderate(widershave)   Atypical nevus x 2 10/21/2001   Left Lower Back-Slight to Moderate and Right Post Upper Arm- Slight   BCC (basal cell carcinoma of skin) 10/21/2001   Left Side Scalp   BCC (basal cell carcinoma of skin) 11/23/2009   Left Forehead(curet and cautery)   BCC (basal cell carcinoma of skin) 08/01/2011   Left Upper Lip(curet)   Hyperlipidemia    Osteopenia    Skin cancer    hx of scalp   Thalassemia     Past Surgical History:  Procedure Laterality Date   APPENDECTOMY     BUNIONECTOMY     complicated with fractured toe x 2 feet   CATARACT EXTRACTION     bilateral   CESAREAN SECTION     x2   COLONOSCOPY  2003   DILATION AND CURETTAGE OF UTERUS      miscarriage     x2   skin cancer scalp     TONSILLECTOMY AND ADENOIDECTOMY     TOTAL HIP ARTHROPLASTY Right 11/25/2022   Procedure: TOTAL HIP ARTHROPLASTY ANTERIOR APPROACH;  Surgeon: Fidel Rogue, MD;  Location: [facility];  Service: Orthopedics;  Laterality: Right;   TUBAL LIGATION     vertigo      Current Medications[1]  Allergies[2]         Objective:  Physical Exam  General: AAO x3, NAD  Dermatological: Nails are hypertrophic, dystrophic, brittle, discolored, elongated 10.  Yellow, brown discoloration of the toenails.  There is incurvation present both medial, lateral aspects of bilateral hallux toenail there is no drainage or pus.  Localized edema for the nail folds but no signs of infection otherwise.  No surrounding redness or drainage. Tenderness nails 1-5 bilaterally. No open lesions or pre-ulcerative lesions are identified today.   Vascular: Dorsalis Pedis artery and Posterior Tibial artery pedal pulses are 2/4 bilateral with immedate capillary fill time. There is no pain with calf compression, swelling, warmth, erythema.   Neruologic: Grossly intact via light touch bilateral.  Sensation intact with Semmes Weinstein monofilament.  Musculoskeletal: Digital contractures present.  Assessment:   Symptomatic onychomycosis, ingrown toenails; concern for possible neuropathy     Plan:  -Treatment options discussed including all alternatives, risks, and complications -Etiology of symptoms were discussed - Discussed treatment options including conservative treatment versus removal of the ingrown toenail.  Ultimately we initially agreed to proceed with debridement.  Sharply debride the nails with any complications or bleeding.  Discussed Epsom salt soaks.  If symptoms continue or worsen we will need to proceed with partial nail avulsions. -Regards to neuropathy discussed brief neurology evaluation but she states that she is going see her PCP tomorrow.   Return if  symptoms worsen or fail to improve.  Abigail Shah DPM           [1]  Current Outpatient Medications:    acetaminophen  (TYLENOL ) 650 MG CR tablet, Take 650 mg by mouth every 6 (six) hours as needed for pain., Disp: , Rfl:    aspirin  EC 81 MG tablet, Take 81 mg by mouth daily. Swallow whole., Disp: , Rfl:    atorvastatin  (LIPITOR) 10 MG tablet, Take 1 tablet (10 mg total) by mouth daily., Disp: 90 tablet, Rfl: 3   Calcium  Carbonate-Vitamin D  500-3.125 MG-MCG TABS, Take by mouth., Disp: , Rfl:    cholecalciferol (VITAMIN D3) 25 MCG (1000 UNIT) tablet, Take 5,000 Units by mouth daily., Disp: , Rfl:    Cyanocobalamin  (B-12) 1000 MCG SUBL, Place under the tongue., Disp: , Rfl:    docusate sodium  (COLACE) 100 MG capsule, Take 1 capsule (100 mg total) by mouth 2 (two) times daily., Disp: 10 capsule, Rfl: 0   bisacodyl (DULCOLAX) 10 MG suppository, Place 10 mg rectally as needed for moderate constipation., Disp: , Rfl:    ondansetron  (ZOFRAN ) 4 MG tablet, Take 4 mg by mouth every 6 (six) hours as needed for nausea or vomiting., Disp: , Rfl:  [2] No Known Allergies

## 2024-07-02 ENCOUNTER — Encounter: Payer: Self-pay | Admitting: Neurology

## 2024-07-02 ENCOUNTER — Ambulatory Visit: Admitting: Family Medicine

## 2024-07-02 ENCOUNTER — Encounter: Payer: Self-pay | Admitting: Family Medicine

## 2024-07-02 VITALS — BP 120/70 | HR 78 | Temp 98.0°F | Ht 63.0 in | Wt 161.0 lb

## 2024-07-02 DIAGNOSIS — Z131 Encounter for screening for diabetes mellitus: Secondary | ICD-10-CM | POA: Diagnosis not present

## 2024-07-02 DIAGNOSIS — E663 Overweight: Secondary | ICD-10-CM | POA: Diagnosis not present

## 2024-07-02 DIAGNOSIS — E559 Vitamin D deficiency, unspecified: Secondary | ICD-10-CM | POA: Diagnosis not present

## 2024-07-02 DIAGNOSIS — E785 Hyperlipidemia, unspecified: Secondary | ICD-10-CM | POA: Diagnosis not present

## 2024-07-02 DIAGNOSIS — R030 Elevated blood-pressure reading, without diagnosis of hypertension: Secondary | ICD-10-CM | POA: Diagnosis not present

## 2024-07-02 DIAGNOSIS — R202 Paresthesia of skin: Secondary | ICD-10-CM | POA: Diagnosis not present

## 2024-07-02 LAB — LIPID PANEL
Cholesterol: 208 mg/dL — ABNORMAL HIGH (ref 28–200)
HDL: 57.6 mg/dL
LDL Cholesterol: 125 mg/dL — ABNORMAL HIGH (ref 10–99)
NonHDL: 150.14
Total CHOL/HDL Ratio: 4
Triglycerides: 126 mg/dL (ref 10.0–149.0)
VLDL: 25.2 mg/dL (ref 0.0–40.0)

## 2024-07-02 LAB — HEMOGLOBIN A1C: Hgb A1c MFr Bld: 5.6 % (ref 4.6–6.5)

## 2024-07-02 LAB — TSH: TSH: 1.45 u[IU]/mL (ref 0.35–5.50)

## 2024-07-02 LAB — VITAMIN B12: Vitamin B-12: 1495 pg/mL — ABNORMAL HIGH (ref 211–911)

## 2024-07-02 LAB — VITAMIN D 25 HYDROXY (VIT D DEFICIENCY, FRACTURES): VITD: 88.68 ng/mL (ref 30.00–100.00)

## 2024-07-02 NOTE — Patient Instructions (Addendum)
 Please stop by lab before you go If you have mychart- we will send your results within 3 business days of us  receiving them.  If you do not have mychart- we will call you about results within 5 business days of us  receiving them.  *please also note that you will see labs on mychart as soon as they post. I will later go in and write notes on them- will say notes from Dr. Katrinka   Referral to neurology is already active and approved it appears - you can call to schedule with Dr. Leigh now- go ahead and call today Address: [address], [place], [place] [address] Phone: [phone]  Recommended follow up: Return for next already scheduled visit or sooner if needed.

## 2024-07-02 NOTE — Progress Notes (Signed)
 " Phone [phone] In person visit   Subjective:   Abigail Shah is a 84 y.o. year old very pleasant female patient who presents for/with See problem oriented charting Chief Complaint  Patient presents with   Peripheral Neuropathy    Bilateral foot neuropathy; [facility]; patient would like a referral to [facility] the neuropathy to see Venetia Potters;    Past Medical History-  Patient Active Problem List   Diagnosis Date Noted   Osteoporosis 08/12/2007    Priority: Medium    Hyperlipidemia 07/04/2007    Priority: Medium    THALASSEMIA NEC 02/15/2007    Priority: Medium    Postmenopausal bleeding 10/23/2014    Priority: Low   Leukopenia 05/20/2014    Priority: Low   SKIN CANCER, HX OF 07/03/2007    Priority: Low   Pain in joint of right knee 07/19/2023   Vitamin D  deficiency 04/27/2023   Osteoarthritis of right knee 02/02/2023   Closed fracture of proximal end of right humerus 11/28/2022   Acute postoperative anemia due to expected blood loss 11/26/2022   Closed right hip fracture, initial encounter (HCC) 11/24/2022   Hyponatremia 11/24/2022   Vomiting 11/24/2022   Right knee pain 11/21/2022   Pain in joint of left shoulder 10/28/2022   BPPV (benign paroxysmal positional vertigo), unspecified laterality 08/05/2021   Bilateral hearing loss 08/05/2021   Hammer toe 08/02/2020   Acquired hallux varus 08/02/2020   Pain in left knee 08/06/2018    Medications- reviewed and updated Current Outpatient Medications  Medication Sig Dispense Refill   acetaminophen  (TYLENOL ) 650 MG CR tablet Take 650 mg by mouth every 6 (six) hours as needed for pain.     aspirin  EC 81 MG tablet Take 81 mg by mouth daily. Swallow whole.     atorvastatin  (LIPITOR) 10 MG tablet Take 1 tablet (10 mg total) by mouth daily. 90 tablet 3   Calcium  Carbonate-Vitamin D  500-3.125 MG-MCG TABS Take by mouth.     cholecalciferol (VITAMIN D3) 25 MCG (1000 UNIT) tablet Take 5,000  Units by mouth daily.     Cyanocobalamin  (B-12) 1000 MCG SUBL Place under the tongue.     docusate sodium  (COLACE) 100 MG capsule Take 1 capsule (100 mg total) by mouth 2 (two) times daily. 10 capsule 0   No current facility-administered medications for this visit.     Objective:  BP 120/70 Comment: most recent home reading  Pulse 78   Temp 98 F (36.7 C) (Temporal)   Ht 5' 3 (1.6 m)   Wt 161 lb (73 kg)   LMP  (LMP Unknown)   SpO2 94%   BMI 28.52 kg/m  Gen: NAD, resting comfortably CV: RRR no murmurs rubs or gallops Lungs: CTAB no crackles, wheeze, rhonchi Ext: no edema Skin: warm, dry Neuro:good sensation with monofilament on feet 2+ DP and PT pulses. No skin breakdown on feet    Assessment and Plan   # Concern for neuropathy S: In August, 22nd of 2025 patient reported concern for neuropathy.  She saw Alyssa Allwardt, PA at that time-reported numbness and tingling in the feet left worse than the right.  Felt like she was walking on bubbles and balance issues.  Hands were not involved.  They noted possible diabetes, B12 deficiency as potential cause-offered labs at that time but she declined  We had a physical on 04/28/2024 but this was not a primary topic of conversation due to potential TIA.  We did review  good pulses history.  She wanted to hold off on medicine at that time.  No obvious diabetes on labs.  B12 and TSH have not been checked with labs-offered these at that time.  She felt like compression stockings in the hospital really worsened her issue.  She saw podiatry yesterday for symptomatic onychomycosis, ingrown toenails, concern for possible neuropathy.  They discussed conservative treatment versus removal of ingrown nail-they debrided area.  She wanted to defer neuropathy discussion until today A/P: ongoing issues for several years after using compression stockings June 2024- pulses are good so I do not think this is peripheral arterial disease. We will check B12,  TSH, a1c. Shed like to see Dr.. HIll- already referre discussed options she will call to schedule   #prior aphasia- we wondered about TIA and did extensive workup that was reassuring- thankfully no recurrence.   #white coat elevated blood pressure S: medication: none Home readings #s: 120/70 most recently BP Readings from Last 3 Encounters:  07/02/24 120/70  04/28/24 118/60  02/08/24 130/68  A/P: blood pressure elevated today 142/70 but home readings have been better. Since blood transfusion in 2024 feels has run somewhat higher- right after up to 150 or so but has trended down. Discussed prefer less than 135/85 on home readings- hold off on medicine  #hyperlipidemia S: Medication: atorvastatin  10 mg Lab Results  Component Value Date   CHOL 212 (H) 04/27/2023   HDL 61.70 04/27/2023   LDLCALC 127 (H) 04/27/2023   LDLDIRECT 134.0 04/25/2022   TRIG 115.0 04/27/2023   CHOLHDL 3 04/27/2023   A/P: Unfortunately lipid panel did not process at physical-hoping for improved levels-update lipid panel today we discussed we could increase medicine if needed   #Vitamin D  deficiency S: Medication: 5000 units with K2 Last vitamin D  Lab Results  Component Value Date   VD25OH 31.33 04/27/2023   A/P: hopefully stable- update vitamin D  today. Continue current meds for now    #gel injections still getting for knee  Recommended follow up: Return for next already scheduled visit or sooner if needed. Future Appointments  Date Time Provider Department Center  01/29/2025 10:15 AM Alm Delon SAILOR, DO [facility] None  04/20/2025  8:30 AM [facility]  04/29/2025  9:00 AM Katrinka Garnette KIDD, MD [facility]    Lab/Order associations:   ICD-10-CM   1. Paresthesias  R20.2 Vitamin B12    TSH    Ambulatory referral to Neurology    2. Vitamin D  deficiency  E55.9 Vitamin D  (25 hydroxy)    3. Screening for diabetes mellitus  Z13.1 HgB A1c    4. Overweight  E66.3 HgB A1c     5. Hyperlipidemia, unspecified hyperlipidemia type  E78.5 Lipid panel    6. White coat syndrome without diagnosis of hypertension  R03.0       No orders of the defined types were placed in this encounter.   Return precautions advised.  Garnette Katrinka, MD  "

## 2024-07-03 ENCOUNTER — Ambulatory Visit: Payer: Self-pay | Admitting: Family Medicine

## 2024-07-04 NOTE — Telephone Encounter (Signed)
 PT appt was on 1/13 with Podiatry

## 2024-08-07 ENCOUNTER — Ambulatory Visit: Admitting: Podiatry

## 2024-08-20 ENCOUNTER — Ambulatory Visit: Payer: Self-pay | Admitting: Neurology

## 2025-01-29 ENCOUNTER — Ambulatory Visit: Admitting: Dermatology

## 2025-04-20 ENCOUNTER — Other Ambulatory Visit

## 2025-04-29 ENCOUNTER — Encounter: Admitting: Family Medicine
